# Patient Record
Sex: Male | Born: 1980
Health system: Southern US, Community
[De-identification: ages and names within clinical notes are randomized; demographics above are authoritative.]

## PROBLEM LIST (undated history)

## (undated) DIAGNOSIS — J40 Bronchitis, not specified as acute or chronic: Secondary | ICD-10-CM

## (undated) DIAGNOSIS — K219 Gastro-esophageal reflux disease without esophagitis: Secondary | ICD-10-CM

## (undated) DIAGNOSIS — F32A Depression, unspecified: Secondary | ICD-10-CM

## (undated) DIAGNOSIS — Z8679 Personal history of other diseases of the circulatory system: Secondary | ICD-10-CM

## (undated) DIAGNOSIS — M1990 Unspecified osteoarthritis, unspecified site: Secondary | ICD-10-CM

## (undated) DIAGNOSIS — N179 Acute kidney failure, unspecified: Secondary | ICD-10-CM

## (undated) DIAGNOSIS — J45909 Unspecified asthma, uncomplicated: Secondary | ICD-10-CM

## (undated) DIAGNOSIS — E785 Hyperlipidemia, unspecified: Secondary | ICD-10-CM

## (undated) DIAGNOSIS — I10 Essential (primary) hypertension: Secondary | ICD-10-CM

## (undated) DIAGNOSIS — I2510 Atherosclerotic heart disease of native coronary artery without angina pectoris: Secondary | ICD-10-CM

## (undated) DIAGNOSIS — Z720 Tobacco use: Secondary | ICD-10-CM

## (undated) DIAGNOSIS — F419 Anxiety disorder, unspecified: Secondary | ICD-10-CM

## (undated) DIAGNOSIS — F329 Major depressive disorder, single episode, unspecified: Secondary | ICD-10-CM

## (undated) HISTORY — DX: Acute kidney failure, unspecified: N17.9

## (undated) HISTORY — DX: Atherosclerotic heart disease of native coronary artery without angina pectoris: I25.10

## (undated) HISTORY — DX: Tobacco use: Z72.0

## (undated) HISTORY — DX: Hyperlipidemia, unspecified: E78.5

## (undated) HISTORY — PX: APPENDECTOMY: SHX54

---

## 2014-03-11 DIAGNOSIS — L400 Psoriasis vulgaris: Secondary | ICD-10-CM | POA: Insufficient documentation

## 2014-08-31 DIAGNOSIS — F411 Generalized anxiety disorder: Secondary | ICD-10-CM | POA: Insufficient documentation

## 2014-10-13 ENCOUNTER — Ambulatory Visit (HOSPITAL_COMMUNITY): Payer: Self-pay | Admitting: Physician Assistant

## 2015-07-13 DIAGNOSIS — L4050 Arthropathic psoriasis, unspecified: Secondary | ICD-10-CM | POA: Diagnosis present

## 2016-07-18 DIAGNOSIS — M5126 Other intervertebral disc displacement, lumbar region: Secondary | ICD-10-CM | POA: Insufficient documentation

## 2016-11-24 ENCOUNTER — Encounter (HOSPITAL_COMMUNITY): Payer: Self-pay | Admitting: Psychiatry

## 2016-11-24 ENCOUNTER — Ambulatory Visit (INDEPENDENT_AMBULATORY_CARE_PROVIDER_SITE_OTHER): Payer: BLUE CROSS/BLUE SHIELD | Admitting: Psychiatry

## 2016-11-24 VITALS — BP 130/80 | HR 86 | Resp 18 | Ht 74.0 in | Wt 368.0 lb

## 2016-11-24 DIAGNOSIS — F411 Generalized anxiety disorder: Secondary | ICD-10-CM | POA: Diagnosis not present

## 2016-11-24 DIAGNOSIS — F17210 Nicotine dependence, cigarettes, uncomplicated: Secondary | ICD-10-CM

## 2016-11-24 DIAGNOSIS — F332 Major depressive disorder, recurrent severe without psychotic features: Secondary | ICD-10-CM | POA: Diagnosis not present

## 2016-11-24 DIAGNOSIS — F0630 Mood disorder due to known physiological condition, unspecified: Secondary | ICD-10-CM | POA: Diagnosis not present

## 2016-11-24 DIAGNOSIS — M549 Dorsalgia, unspecified: Secondary | ICD-10-CM | POA: Insufficient documentation

## 2016-11-24 DIAGNOSIS — Z888 Allergy status to other drugs, medicaments and biological substances status: Secondary | ICD-10-CM

## 2016-11-24 DIAGNOSIS — Z79899 Other long term (current) drug therapy: Secondary | ICD-10-CM

## 2016-11-24 DIAGNOSIS — I10 Essential (primary) hypertension: Secondary | ICD-10-CM | POA: Insufficient documentation

## 2016-11-24 MED ORDER — LAMOTRIGINE 25 MG PO TABS: 25.0000 mg | ORAL_TABLET | Freq: Every day | ORAL | 0 refills | Status: DC

## 2016-11-24 NOTE — Progress Notes (Signed)
Psychiatric Initial Adult Assessment   Patient Identification: Scott Waller MRN:  [id number] Date of Evaluation:  11/24/2016 Referral Source: Marylene Landaniel Bryan, Primary care Chief Complaint:   Chief Complaint    Establish Care     Visit Diagnosis:    ICD-9-CM ICD-10-CM   1. Mood disorder in conditions classified elsewhere 293.83 F06.30   2. Severe episode of recurrent major depressive disorder, without psychotic features (HCC) 296.33 F33.2   3. GAD (generalized anxiety disorder) 300.02 F41.1     History of Present Illness:  36 years old currently married Caucasian male referred by primary care office for management for evaluation of depression and anxiety. Currently is on Xanax prescribed by primary care. States that he has mood swings and want to have possible evaluation if he has bipolar.  Patient doesn't drink he is drinking 6 packs on weekdays and 12 packs on the weekends that he has been a drinker he only drinks beer as a found the past he has been a cocaine user and marijuana user but does not use that anymore.  Endorses feeling down and depressed anxiety excessive worries he has had panic attacks in the past he has been on Xanax for the last 10 years. Says that once he has a panic attack nothing else helps but the Xanax. He has difficult relationship with her wife who keeps up with the finances he does not have any clue where the money goes he brings in the money and it is transferred to his wife's account. He does have a son 38 years old. Says my in-laws does not like me. 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 He still does not want to talk too much about alcohol says that he has gone through so much and  alcohol is only thing that keeps him going he does not want to cut down further. He got confrontative that if doc you want to talk about alcohol I am going to step out.   He fixes car and sells them he does have episodes of increased energy but not psychotic symptoms. He likes fixing car or keeping himself busy but as of now he has limitations and neuropathy keeps him more withdrawn and depressed.  Modifying factors; his son Associated Signs/Symptoms: Depression Symptoms:  depressed mood, difficulty concentrating, anxiety, loss of energy/fatigue, (Hypo) Manic Symptoms:  Distractibility, lability of mood Anxiety Symptoms:  Excessive Worry, Psychotic Symptoms:  denies PTSD Symptoms: Had a traumatic exposure:  difficult childhood Hyperarousal:  Irritability/Anger  Past Psychiatric History: depression. Anxiety. Panic attacks  Previous Psychotropic Medications: Yes  Says have tried all SSRI . pristiq . They dont work. Only xanax helps anxiety  Substance Abuse History in the last 12 months:  Yes.    alcohol  Consequences of Substance Abuse: Medical Consequences:  depression, mood swing.   Past Medical History: No past medical history on file. No past surgical history on file.  Family Psychiatric History: they had issues with anxiety..mom had extreme anxiety  Family History: No family history on file.  Social History:   Social History   Social History  . Marital status: Married    Spouse name: N/A  . Number of children: N/A  .  Years of education: N/A   Social History Main Topics  . Smoking status: Current Every Day Smoker    Packs/day: 1.00    Years: 25.00    Types: Cigarettes  . Smokeless tobacco: Never Used  . Alcohol use 12.0 - 14.4 oz/week    20 - 24 Cans of beer per week  . Drug use: No  . Sexual activity: Yes    Partners: Female   Other Topics Concern  . None   Social History Narrative  . None    Additional Social History: His parents and sister says it is  tough and rough growing up. He left high school in 15th year. 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 Currently married legal issue.  Allergies:   Allergies  Allergen Reactions  . Desvenlafaxine Anaphylaxis    High BP    Metabolic Disorder Labs: No results found for: HGBA1C, MPG No results found for: PROLACTIN No results found for: CHOL, TRIG, HDL, CHOLHDL, VLDL, LDLCALC   Current Medications: Current Outpatient Prescriptions  Medication Sig Dispense Refill  . ALPRAZolam (XANAX) 0.5 MG tablet Take 0.5 mg by mouth 3 (three) times daily as needed.    . diltiazem (CARDIZEM CD) 240 MG 24 hr capsule Take 240 mg by mouth daily.    Marland Kitchen HYDROcodone-acetaminophen (NORCO) 10-325 MG tablet Take 1/2-1 tablet by mouth up to 3 times daily PRN pain    . lisinopril-hydrochlorothiazide (PRINZIDE,ZESTORETIC) 20-25 MG tablet Take 20-25 tablets by mouth daily.    . traZODone (DESYREL) 100 MG tablet Take 1/2-1 tablet by mouth nighty for insomnia    . lamoTRIgine (LAMICTAL) 25 MG tablet Take 1 tablet (25 mg total) by mouth daily. Take one tablet daily for a week and then start taking 2 tablets. 60 tablet 0   No current facility-administered medications for this visit.     Neurologic: Headache: No Seizure: No Paresthesias:Yes  Musculoskeletal: Strength & Muscle Tone: within normal limits Gait & Station: normal Patient leans: no lean  Psychiatric Specialty Exam: Review of Systems  Cardiovascular: Negative for chest pain.  Musculoskeletal: Positive for back pain.  Skin: Negative for rash.  Psychiatric/Behavioral: Positive for depression. Negative for suicidal ideas. The patient is nervous/anxious.     Blood pressure 130/80, pulse 86, resp. rate 18, height 6\' 2"  (1.88 m), weight (!) 368 lb (166.9 kg), SpO2 95 %.Body mass index is 47.25 kg/m.  General Appearance: Casual  Eye Contact:  Fair  Speech:  Normal Rate   Volume:  Normal  Mood:  Anxious and Dysphoric  Affect:  Congruent  Thought Process:  Goal Directed  Orientation:  Full (Time, Place, and Person)  Thought Content:  Rumination  Suicidal Thoughts:  No  Homicidal Thoughts:  No  Memory:  Immediate;   Fair Recent;   Fair  Judgement:  Poor  Insight:  Shallow  Psychomotor Activity:  Decreased  Concentration:  Concentration: Fair and Attention Span: Fair  Recall:  [employer] of Knowledge:Fair  Language: Fair  Akathisia:  Negative  Handed:  Right  AIMS (if indicated):    Assets:  Desire for Improvement Housing  ADL's:  Intact  Cognition: WNL  Sleep:  fair    Treatment Plan Summary: Medication management and Plan as follows  Mood disorder/ Major depression, recurrent moderate: has been on SSRI . Doesn't help.  Alcohol and medical conditions may be exacbating his symptoms. Will start mood stabilizer never tried.  lamictal 25mg  increase to 50 in one week  GAD/ Panic: on xanax by primary care. Says he will continue with them. I cautioned we will not prescribe it and he should cut down slowly. Says he will get it from primary care and has meds. Does not want to be on ssri or buspar.  He understands he is on pain meds as well and combination with benzo can make him feel down or impulsive.  Alcohol use : 6 packs a day. More on weekends. Remains resistent to cut down says he has been drinking most of his life and this is only thing that gives him some thing to look forward Explained meds may not work as alcohol is a downer and would keep him dysphoric or emotional.   Will refer to therapy to work on coping skills and understand dynamics of his anxiety and depression  Not suicidal . Not psychotic More than 50% spent in counseling and coordination of care including patient education and review of side effects and working on coping skills and abstinence as a goal from alcohol FU 3-4 weeks or early if needed.   [name],  MD 2/23/201811:45 AM

## 2016-12-07 ENCOUNTER — Other Ambulatory Visit (HOSPITAL_COMMUNITY): Payer: Self-pay | Admitting: Psychiatry

## 2016-12-14 NOTE — Telephone Encounter (Signed)
Received fax from [facility] requesting a refill for Lamictal. Per Dr. Gilmore LarocheAkhtar, refill request is denied. Pt will need to schedule an apt. Lvm for pt to contact office.

## 2016-12-15 ENCOUNTER — Ambulatory Visit (HOSPITAL_COMMUNITY): Payer: Self-pay | Admitting: Psychiatry

## 2016-12-22 ENCOUNTER — Ambulatory Visit (HOSPITAL_COMMUNITY): Payer: Self-pay | Admitting: Licensed Clinical Social Worker

## 2017-09-28 ENCOUNTER — Encounter (HOSPITAL_COMMUNITY): Payer: Self-pay | Admitting: Emergency Medicine

## 2017-09-28 ENCOUNTER — Emergency Department (HOSPITAL_COMMUNITY): Payer: BLUE CROSS/BLUE SHIELD

## 2017-09-28 ENCOUNTER — Emergency Department (HOSPITAL_COMMUNITY)
Admission: EM | Admit: 2017-09-28 | Discharge: 2017-09-28 | Disposition: A | Discharge status: Home / Self Care | Payer: BLUE CROSS/BLUE SHIELD | Attending: Emergency Medicine | Admitting: Emergency Medicine

## 2017-09-28 ENCOUNTER — Other Ambulatory Visit: Payer: Self-pay

## 2017-09-28 DIAGNOSIS — I10 Essential (primary) hypertension: Secondary | ICD-10-CM | POA: Diagnosis not present

## 2017-09-28 DIAGNOSIS — J4541 Moderate persistent asthma with (acute) exacerbation: Secondary | ICD-10-CM | POA: Diagnosis not present

## 2017-09-28 DIAGNOSIS — R0602 Shortness of breath: Secondary | ICD-10-CM | POA: Diagnosis present

## 2017-09-28 DIAGNOSIS — R05 Cough: Secondary | ICD-10-CM | POA: Insufficient documentation

## 2017-09-28 DIAGNOSIS — Z79899 Other long term (current) drug therapy: Secondary | ICD-10-CM | POA: Insufficient documentation

## 2017-09-28 DIAGNOSIS — R509 Fever, unspecified: Secondary | ICD-10-CM | POA: Diagnosis not present

## 2017-09-28 DIAGNOSIS — F17210 Nicotine dependence, cigarettes, uncomplicated: Secondary | ICD-10-CM | POA: Diagnosis not present

## 2017-09-28 DIAGNOSIS — J45909 Unspecified asthma, uncomplicated: Secondary | ICD-10-CM | POA: Diagnosis not present

## 2017-09-28 HISTORY — DX: Bronchitis, not specified as acute or chronic: J40

## 2017-09-28 HISTORY — DX: Unspecified osteoarthritis, unspecified site: M19.90

## 2017-09-28 HISTORY — DX: Unspecified asthma, uncomplicated: J45.909

## 2017-09-28 HISTORY — DX: Essential (primary) hypertension: I10

## 2017-09-28 LAB — CBC WITH DIFFERENTIAL/PLATELET
BASOS ABS: 0 10*3/uL (ref 0.0–0.1)
Basophils Relative: 0 %
Eosinophils Absolute: 0.1 10*3/uL (ref 0.0–0.7)
Eosinophils Relative: 2 %
HEMATOCRIT: 47.2 % (ref 39.0–52.0)
HEMOGLOBIN: 15 g/dL (ref 13.0–17.0)
LYMPHS PCT: 14 %
Lymphs Abs: 0.7 10*3/uL (ref 0.7–4.0)
MCH: 30.9 pg (ref 26.0–34.0)
MCHC: 31.8 g/dL (ref 30.0–36.0)
MCV: 97.3 fL (ref 78.0–100.0)
MONO ABS: 0.4 10*3/uL (ref 0.1–1.0)
MONOS PCT: 7 %
NEUTROS ABS: 3.9 10*3/uL (ref 1.7–7.7)
NEUTROS PCT: 77 %
Platelets: 159 10*3/uL (ref 150–400)
RBC: 4.85 MIL/uL (ref 4.22–5.81)
RDW: 12.8 % (ref 11.5–15.5)
WBC: 5.1 10*3/uL (ref 4.0–10.5)

## 2017-09-28 LAB — BRAIN NATRIURETIC PEPTIDE: B Natriuretic Peptide: 14 pg/mL (ref 0.0–100.0)

## 2017-09-28 LAB — BASIC METABOLIC PANEL
ANION GAP: 11 (ref 5–15)
BUN: 11 mg/dL (ref 6–20)
CO2: 27 mmol/L (ref 22–32)
Calcium: 8.7 mg/dL — ABNORMAL LOW (ref 8.9–10.3)
Chloride: 99 mmol/L — ABNORMAL LOW (ref 101–111)
Creatinine, Ser: 0.93 mg/dL (ref 0.61–1.24)
GFR calc non Af Amer: >60 mL/min (ref >60)
GLUCOSE: 115 mg/dL — AB (ref 65–99)
POTASSIUM: 4.1 mmol/L (ref 3.5–5.1)
Sodium: 137 mmol/L (ref 135–145)

## 2017-09-28 LAB — TROPONIN I

## 2017-09-28 MED ORDER — ALBUTEROL SULFATE (2.5 MG/3ML) 0.083% IN NEBU
2.5000 mg | INHALATION_SOLUTION | Freq: Once | RESPIRATORY_TRACT | Status: AC
  Administered 2017-09-28: 2.5 mg via RESPIRATORY_TRACT
  Filled 2017-09-28: qty 3

## 2017-09-28 MED ORDER — IPRATROPIUM-ALBUTEROL 0.5-2.5 (3) MG/3ML IN SOLN
3.0000 mL | Freq: Once | RESPIRATORY_TRACT | Status: AC
  Administered 2017-09-28: 3 mL via RESPIRATORY_TRACT
  Filled 2017-09-28: qty 3

## 2017-09-28 MED ORDER — PREDNISONE 50 MG PO TABS: ORAL_TABLET | ORAL | 0 refills | Status: DC

## 2017-09-28 MED ORDER — ALBUTEROL SULFATE (2.5 MG/3ML) 0.083% IN NEBU: 2.5000 mg | INHALATION_SOLUTION | RESPIRATORY_TRACT | 12 refills | Status: AC | PRN

## 2017-09-28 MED ORDER — PREDNISONE 50 MG PO TABS
60.0000 mg | ORAL_TABLET | Freq: Once | ORAL | Status: AC
  Administered 2017-09-28: 60 mg via ORAL
  Filled 2017-09-28: qty 1

## 2017-09-28 NOTE — ED Provider Notes (Signed)
[facility] EMERGENCY DEPARTMENT Provider Note   CSN: [id number] Arrival date & time: 09/28/17  0259     History   Chief Complaint Chief Complaint  Patient presents with  . Shortness of Breath    HPI Scott Waller is a 36 y.o. male.  The history is provided by the patient.  Shortness of Breath  This is a new problem. The average episode lasts 2 days. The problem occurs frequently.The current episode started 2 days ago. The problem has been gradually worsening. Associated symptoms include a fever, cough and wheezing. Pertinent negatives include no sore throat, no hemoptysis, no chest pain and no leg swelling. Treatments tried: Albuterol. The treatment provided mild relief. Associated medical issues include asthma.  Patient with history of asthma, morbid obesity presents with shortness of breath, cough for the past 2 days He reports he has felt febrile and having chills at home No significant chest pain reported He has had some shortness of breath though this is due to his asthma Patient is a smoker Past Medical History:  Diagnosis Date  . A-fib (HCC)   . Arthritis   . Asthma   . Bronchitis   . Depression   . Hypertension     Patient Active Problem List   Diagnosis Date Noted  . Back pain 11/24/2016  . Essential hypertension 11/24/2016  . Herniated lumbar intervertebral disc 07/18/2016  . Generalized anxiety disorder 08/31/2014    Past Surgical History:  Procedure Laterality Date  . APPENDECTOMY         Home Medications    Prior to Admission medications   Medication Sig Start Date End Date Taking? Authorizing Provider  ALPRAZolam Prudy Feeler(XANAX) 0.5 MG tablet Take 0.5 mg by mouth 3 (three) times daily as needed. 11/07/16  Yes [provider]  diltiazem (CARDIZEM CD) 240 MG 24 hr capsule Take 240 mg by mouth daily. 05/12/16  Yes [provider]  HYDROcodone-acetaminophen (NORCO) 10-325 MG tablet Take 1/2-1 tablet by mouth up to 3 times daily PRN pain  11/07/16  Yes [provider]  lamoTRIgine (LAMICTAL) 25 MG tablet Take 1 tablet (25 mg total) by mouth daily. Take one tablet daily for a week and then start taking 2 tablets. 11/24/16  Yes Thresa RossAkhtar, Nadeem, MD  lisinopril-hydrochlorothiazide (PRINZIDE,ZESTORETIC) 20-25 MG tablet Take 20-25 tablets by mouth daily. 12/08/15 09/28/17 Yes [provider]  traZODone (DESYREL) 100 MG tablet Take 1/2-1 tablet by mouth nighty for insomnia 06/20/16  Yes [provider]    Family History No family history on file.  Social History Social History   Tobacco Use  . Smoking status: Current Every Day Smoker    Packs/day: 1.00    Years: 25.00    Pack years: 25.00    Types: Cigarettes  . Smokeless tobacco: Never Used  Substance Use Topics  . Alcohol use: Yes    Alcohol/week: 12.0 - 14.4 oz    Types: 20 - 24 Cans of beer per week  . Drug use: No     Allergies   Desvenlafaxine   Review of Systems Review of Systems  Constitutional: Positive for fever.  HENT: Negative for sore throat.   Respiratory: Positive for cough, shortness of breath and wheezing. Negative for hemoptysis.   Cardiovascular: Negative for chest pain and leg swelling.  All other systems reviewed and are negative.    Physical Exam Updated Vital Signs BP 130/77   Pulse 95   Temp 99.3 F (37.4 C) (Oral)   Resp 16  Ht 1.905 m (6\' 3" )   Wt (!) 167.8 kg (370 lb)   SpO2 94%   BMI 46.25 kg/m   Physical Exam CONSTITUTIONAL: Well developed/well nourished, resting comfortably HEAD: Normocephalic/atraumatic EYES: EOMI/PERRL ENMT: Mucous membranes moist NECK: supple no meningeal signs SPINE/BACK:entire spine nontender CV: S1/S2 noted, no murmurs/rubs/gallops noted LUNGS: No distress noted, wheezing noted in the bases ABDOMEN: soft, nontender, obese GU:no cva tenderness NEURO: Pt is awake/alert/appropriate, moves all extremitiesx4.  No facial droop.   EXTREMITIES: pulses normal/equal, full  ROM SKIN: warm, color normal PSYCH: no abnormalities of mood noted, alert and oriented to situation   ED Treatments / Results  Labs (all labs ordered are listed, but only abnormal results are displayed) Labs Reviewed  BASIC METABOLIC PANEL - Abnormal; Notable for the following components:      Result Value   Chloride 99 (*)    Glucose, Bld 115 (*)    Calcium 8.7 (*)    All other components within normal limits  CBC WITH DIFFERENTIAL/PLATELET  BRAIN NATRIURETIC PEPTIDE  TROPONIN I    EKG  EKG Interpretation  Date/Time:  Friday September 28 2017 03:08:06 EST Ventricular Rate:  103 PR Interval:    QRS Duration: 92 QT Interval:  318 QTC Calculation: 417 R Axis:   -37 Text Interpretation:  Sinus tachycardia Left axis deviation Low voltage, precordial leads Probable anteroseptal infarct, old No previous ECGs available Confirmed by Zadie RhineWickline, Renne Cornick ([id number]) on 09/28/2017 3:13:09 AM       Radiology Dg Chest 2 View  Result Date: 09/28/2017 CLINICAL DATA:  36 y/o  M; shortness of breath. EXAM: CHEST  2 VIEW COMPARISON:  None. FINDINGS: Normal cardiac silhouette. Pulmonary vascular congestion. No consolidation. No pleural effusion or pneumothorax. Bones are unremarkable. IMPRESSION: Pulmonary vascular congestion.  No consolidation. Electronically Signed   By: Mitzi HansenLance  Furusawa-Stratton M.D.   On: 09/28/2017 03:34    Procedures Procedures (including critical care time)  Medications Ordered in ED Medications  ipratropium-albuterol (DUONEB) 0.5-2.5 (3) MG/3ML nebulizer solution 3 mL (3 mLs Nebulization Given 09/28/17 0512)  predniSONE (DELTASONE) tablet 60 mg (60 mg Oral Given 09/28/17 0508)  albuterol (PROVENTIL) (2.5 MG/3ML) 0.083% nebulizer solution 2.5 mg (2.5 mg Nebulization Given 09/28/17 0512)     Initial Impression / Assessment and Plan / ED Course  I have reviewed the triage vital signs and the nursing notes.  Pertinent labs & imaging results that were available during  my care of the patient were reviewed by me and considered in my medical decision making (see chart for details).     5:21 AM Patient presents with cough, feeling chills, feeling febrile, shortness of breath for 2 days He reports he does have a history of asthma, and has nebulizer at home that he has been using Suspect this may be a viral illness that has contributed to his wheezing However we do not have much testing in our system, will check labs and reassess He reports he does have low doses of prednisone at home for his arthritis, and has been taking 15 mg for 2 days We will need to increase this dosing 6:36 AM Patient improved, resting comfortably There is no respiratory distress On room air his pulse ox is around 95% Suspect viral illness that has triggered his wheezing I have low suspicion for CHF, PE at this time  Told to quit smoking We discussed strict return precautions, and appropriate use of albuterol with his nebulizer at home Final Clinical Impressions(s) / ED Diagnoses   Final  diagnoses:  Moderate persistent asthma with exacerbation    ED Discharge Orders        Ordered    predniSONE (DELTASONE) 50 MG tablet     09/28/17 0620    albuterol (PROVENTIL) (2.5 MG/3ML) 0.083% nebulizer solution  Every 4 hours PRN     09/28/17 1610       [name], MD 09/28/17 [phone]

## 2017-09-28 NOTE — ED Triage Notes (Signed)
Pt c/o sob since 12/25. Pt states he has been coughing up mucus.

## 2018-08-14 ENCOUNTER — Emergency Department (HOSPITAL_COMMUNITY)
Admission: EM | Admit: 2018-08-14 | Discharge: 2018-08-14 | Disposition: A | Discharge status: Home / Self Care | Payer: BLUE CROSS/BLUE SHIELD | Attending: Emergency Medicine | Admitting: Emergency Medicine

## 2018-08-14 ENCOUNTER — Emergency Department (HOSPITAL_COMMUNITY): Payer: BLUE CROSS/BLUE SHIELD

## 2018-08-14 ENCOUNTER — Encounter (HOSPITAL_COMMUNITY): Payer: Self-pay | Admitting: Radiology

## 2018-08-14 DIAGNOSIS — Z79899 Other long term (current) drug therapy: Secondary | ICD-10-CM | POA: Insufficient documentation

## 2018-08-14 DIAGNOSIS — R0602 Shortness of breath: Secondary | ICD-10-CM | POA: Diagnosis present

## 2018-08-14 DIAGNOSIS — I10 Essential (primary) hypertension: Secondary | ICD-10-CM | POA: Insufficient documentation

## 2018-08-14 DIAGNOSIS — J45909 Unspecified asthma, uncomplicated: Secondary | ICD-10-CM | POA: Insufficient documentation

## 2018-08-14 DIAGNOSIS — R109 Unspecified abdominal pain: Secondary | ICD-10-CM | POA: Diagnosis not present

## 2018-08-14 DIAGNOSIS — R1011 Right upper quadrant pain: Secondary | ICD-10-CM | POA: Diagnosis not present

## 2018-08-14 DIAGNOSIS — R10811 Right upper quadrant abdominal tenderness: Secondary | ICD-10-CM

## 2018-08-14 DIAGNOSIS — F17210 Nicotine dependence, cigarettes, uncomplicated: Secondary | ICD-10-CM | POA: Insufficient documentation

## 2018-08-14 LAB — HEPATIC FUNCTION PANEL
ALK PHOS: 38 U/L (ref 38–126)
ALT: 51 U/L — ABNORMAL HIGH (ref 0–44)
AST: 35 U/L (ref 15–41)
Albumin: 4.1 g/dL (ref 3.5–5.0)
BILIRUBIN DIRECT: 0.1 mg/dL (ref 0.0–0.2)
BILIRUBIN INDIRECT: 0.5 mg/dL (ref 0.3–0.9)
BILIRUBIN TOTAL: 0.6 mg/dL (ref 0.3–1.2)
Total Protein: 7.5 g/dL (ref 6.5–8.1)

## 2018-08-14 LAB — CBC
HCT: 49.4 % (ref 39.0–52.0)
HEMOGLOBIN: 16.2 g/dL (ref 13.0–17.0)
MCH: 31.5 pg (ref 26.0–34.0)
MCHC: 32.8 g/dL (ref 30.0–36.0)
MCV: 95.9 fL (ref 80.0–100.0)
NRBC: 0 % (ref 0.0–0.2)
Platelets: 180 10*3/uL (ref 150–400)
RBC: 5.15 MIL/uL (ref 4.22–5.81)
RDW: 12.6 % (ref 11.5–15.5)
WBC: 7.5 10*3/uL (ref 4.0–10.5)

## 2018-08-14 LAB — URINALYSIS, ROUTINE W REFLEX MICROSCOPIC
BILIRUBIN URINE: NEGATIVE
Glucose, UA: NEGATIVE mg/dL
Hgb urine dipstick: NEGATIVE
KETONES UR: NEGATIVE mg/dL
Leukocytes, UA: NEGATIVE
NITRITE: NEGATIVE
PROTEIN: NEGATIVE mg/dL
Specific Gravity, Urine: 1.004 — ABNORMAL LOW (ref 1.005–1.030)
pH: 7 (ref 5.0–8.0)

## 2018-08-14 LAB — LIPASE, BLOOD: Lipase: 27 U/L (ref 11–51)

## 2018-08-14 LAB — BASIC METABOLIC PANEL
ANION GAP: 11 (ref 5–15)
BUN: 14 mg/dL (ref 6–20)
CO2: 26 mmol/L (ref 22–32)
Calcium: 9.1 mg/dL (ref 8.9–10.3)
Chloride: 97 mmol/L — ABNORMAL LOW (ref 98–111)
Creatinine, Ser: 1 mg/dL (ref 0.61–1.24)
GFR calc non Af Amer: >60 mL/min (ref >60)
Glucose, Bld: 115 mg/dL — ABNORMAL HIGH (ref 70–99)
POTASSIUM: 4.3 mmol/L (ref 3.5–5.1)
SODIUM: 134 mmol/L — AB (ref 135–145)

## 2018-08-14 LAB — POCT I-STAT TROPONIN I
TROPONIN I, POC: 0.01 ng/mL (ref 0.00–0.08)
TROPONIN I, POC: 0.03 ng/mL (ref 0.00–0.08)

## 2018-08-14 MED ORDER — SODIUM CHLORIDE (PF) 0.9 % IJ SOLN
INTRAMUSCULAR | Status: AC
  Administered 2018-08-14: 20:00:00
  Filled 2018-08-14: qty 50

## 2018-08-14 MED ORDER — NICOTINE 21 MG/24HR TD PT24
21.0000 mg | MEDICATED_PATCH | Freq: Once | TRANSDERMAL | Status: DC
  Administered 2018-08-14: 21 mg via TRANSDERMAL
  Filled 2018-08-14: qty 1

## 2018-08-14 MED ORDER — LORAZEPAM 2 MG/ML IJ SOLN: 0.0000 mg | Freq: Two times a day (BID) | INTRAMUSCULAR | Status: DC

## 2018-08-14 MED ORDER — LORAZEPAM 1 MG PO TABS
0.0000 mg | ORAL_TABLET | Freq: Four times a day (QID) | ORAL | Status: DC
  Filled 2018-08-14: qty 1

## 2018-08-14 MED ORDER — LORAZEPAM 2 MG/ML IJ SOLN
0.0000 mg | Freq: Four times a day (QID) | INTRAMUSCULAR | Status: DC
  Administered 2018-08-14: 2 mg via INTRAVENOUS
  Filled 2018-08-14: qty 1

## 2018-08-14 MED ORDER — IOPAMIDOL (ISOVUE-300) INJECTION 61%
100.0000 mL | Freq: Once | INTRAVENOUS | Status: AC | PRN
  Administered 2018-08-14: 100 mL via INTRAVENOUS

## 2018-08-14 MED ORDER — METHOCARBAMOL 500 MG PO TABS: 500.0000 mg | ORAL_TABLET | Freq: Two times a day (BID) | ORAL | 0 refills | Status: DC

## 2018-08-14 MED ORDER — THIAMINE HCL 100 MG/ML IJ SOLN
100.0000 mg | Freq: Every day | INTRAMUSCULAR | Status: DC
  Administered 2018-08-14: 100 mg via INTRAVENOUS
  Filled 2018-08-14: qty 2

## 2018-08-14 MED ORDER — DICYCLOMINE HCL 20 MG PO TABS: 20.0000 mg | ORAL_TABLET | Freq: Two times a day (BID) | ORAL | 0 refills | Status: DC

## 2018-08-14 MED ORDER — SODIUM CHLORIDE 0.9 % IV BOLUS
1000.0000 mL | Freq: Once | INTRAVENOUS | Status: AC
  Administered 2018-08-14: 1000 mL via INTRAVENOUS

## 2018-08-14 MED ORDER — VITAMIN B-1 100 MG PO TABS
100.0000 mg | ORAL_TABLET | Freq: Every day | ORAL | Status: DC
  Filled 2018-08-14: qty 1

## 2018-08-14 MED ORDER — ONDANSETRON HCL 4 MG/2ML IJ SOLN
4.0000 mg | Freq: Once | INTRAMUSCULAR | Status: AC
  Administered 2018-08-14: 4 mg via INTRAVENOUS
  Filled 2018-08-14: qty 2

## 2018-08-14 MED ORDER — LORAZEPAM 1 MG PO TABS: 0.0000 mg | ORAL_TABLET | Freq: Two times a day (BID) | ORAL | Status: DC

## 2018-08-14 MED ORDER — IOPAMIDOL (ISOVUE-300) INJECTION 61%
INTRAVENOUS | Status: AC
  Filled 2018-08-14: qty 100

## 2018-08-14 MED ORDER — MORPHINE SULFATE (PF) 4 MG/ML IV SOLN
4.0000 mg | Freq: Once | INTRAVENOUS | Status: AC
  Administered 2018-08-14: 4 mg via INTRAVENOUS
  Filled 2018-08-14: qty 1

## 2018-08-14 MED ORDER — ONDANSETRON 8 MG PO TBDP: 8.0000 mg | ORAL_TABLET | Freq: Three times a day (TID) | ORAL | 0 refills | Status: AC | PRN

## 2018-08-14 NOTE — ED Provider Notes (Signed)
[facility]-EMERGENCY DEPT Provider Note   CSN: [id number] Arrival date & time: 08/14/18  1309     History   Chief Complaint Chief Complaint  Patient presents with  . Shortness of Breath  . Abdominal Pain    HPI Scott Waller is a 37 y.o. male.  HPI   Scott Waller is a 37 y.o. male, with a history of A. fib, asthma, and HTN, presenting to the ED with right shoulder pain for the last few days. This pain would occur at night or with eating certain foods. Also been having pain in the right flank. Accompanied by nausea and vomiting.  Last night began to have pain in the RUQ along with the shoulder pain. Pain is now waxing and waning, currently 3-4/10, sharp, nonradiating from RUQ and right shoulder. Now accompanied by chills. At times he has tried taking NTG, has improved the pain in the past, but not last night. His last food was this morning at 7:30AM.  Denies known fever, chest pain, shortness of breath, vomiting, diarrhea, hematochezia/melena, urinary symptoms, acute cough, or any other complaints.     Past Medical History:  Diagnosis Date  . A-fib (HCC)   . Arthritis   . Asthma   . Bronchitis   . Depression   . Hypertension     Patient Active Problem List   Diagnosis Date Noted  . Back pain 11/24/2016  . Essential hypertension 11/24/2016  . Herniated lumbar intervertebral disc 07/18/2016  . Generalized anxiety disorder 08/31/2014    Past Surgical History:  Procedure Laterality Date  . APPENDECTOMY          Home Medications    Prior to Admission medications   Medication Sig Start Date End Date Taking? Authorizing Provider  albuterol (PROVENTIL) (2.5 MG/3ML) 0.083% nebulizer solution Take 3 mLs (2.5 mg total) by nebulization every 4 (four) hours as needed for wheezing or shortness of breath. 09/28/17  Yes Zadie RhineWickline, Donald, MD  ALPRAZolam Prudy Feeler(XANAX) 0.5 MG tablet Take 0.5 mg by mouth 3 (three) times daily as needed for anxiety.  11/07/16  Yes  [provider]  BREO ELLIPTA 100-25 MCG/INH AEPB Take 1 puff by mouth daily. 07/26/18  Yes [provider]  CARAFATE 1 GM/10ML suspension Take 10 mLs by mouth 4 (four) times daily -  with meals and at bedtime. 08/05/18  Yes [provider]  Clobetasol Propionate (TEMOVATE) 0.05 % external spray Apply 1 application topically 2 (two) times daily as needed. 08/12/18  Yes [provider]  COSENTYX SENSOREADY, 300 MG, 150 MG/ML SOAJ Inject 150 mg into the skin every 30 (thirty) days. 07/29/18  Yes [provider]  diltiazem (CARDIZEM CD) 240 MG 24 hr capsule Take 240 mg by mouth every evening.  05/12/16  Yes [provider]  fluticasone (FLONASE) 50 MCG/ACT nasal spray Place 2 sprays into both nostrils daily. 08/10/18  Yes [provider]  HYDROcodone-acetaminophen (NORCO) 10-325 MG tablet Take 1 tablet by mouth 3 (three) times daily.  11/07/16  Yes [provider]  lamoTRIgine (LAMICTAL) 25 MG tablet Take 1 tablet (25 mg total) by mouth daily. Take one tablet daily for a week and then start taking 2 tablets. Patient taking differently: Take 50 mg by mouth daily.  11/24/16  Yes Thresa RossAkhtar, Nadeem, MD  Multiple Vitamins-Minerals (MULTIVITAMIN MEN PO) Take 1 tablet by mouth daily.   Yes [provider]  nitroGLYCERIN (NITROSTAT) 0.4 MG SL tablet Place 0.4 mg under the tongue every 5 (five) minutes  x 3 doses as needed. If no improvement, call 911 08/08/18  Yes [provider]  ondansetron (ZOFRAN-ODT) 8 MG disintegrating tablet Take 8 mg by mouth 3 (three) times daily as needed for nausea or vomiting.  08/05/18  Yes [provider]  pantoprazole (PROTONIX) 40 MG tablet Take 40 mg by mouth 2 (two) times daily. 08/08/18  Yes [provider]  traZODone (DESYREL) 150 MG tablet Take 150 mg by mouth at bedtime.   Yes [provider]  valsartan-hydrochlorothiazide (DIOVAN-HCT) 320-25 MG tablet Take 1 tablet by  mouth daily. 06/10/18  Yes [provider]  dicyclomine (BENTYL) 20 MG tablet Take 1 tablet (20 mg total) by mouth 2 (two) times daily. 08/14/18   [name], [name], PA-C  methocarbamol (ROBAXIN) 500 MG tablet Take 1 tablet (500 mg total) by mouth 2 (two) times daily. 08/14/18   [name], [name], PA-C  ondansetron (ZOFRAN-ODT) 8 MG disintegrating tablet Take 1 tablet (8 mg total) by mouth every 8 (eight) hours as needed for nausea or vomiting. 08/14/18   [name], Hillard Danker, PA-C  predniSONE (DELTASONE) 50 MG tablet One tablet PO daily for 4 days Patient not taking: Reported on 08/14/2018 09/28/17   [name], MD    Family History No family history on file.  Social History Social History   Tobacco Use  . Smoking status: Current Every Day Smoker    Packs/day: 1.00    Years: 25.00    Pack years: 25.00    Types: Cigarettes  . Smokeless tobacco: Never Used  Substance Use Topics  . Alcohol use: Yes    Alcohol/week: 20.0 - 24.0 standard drinks    Types: 20 - 24 Cans of beer per week  . Drug use: No     Allergies   Desvenlafaxine   Review of Systems Review of Systems  Constitutional: Positive for chills. Negative for fever.  Respiratory: Negative for cough and shortness of breath.   Cardiovascular: Negative for chest pain and leg swelling.  Gastrointestinal: Positive for abdominal pain and nausea. Negative for blood in stool, constipation, diarrhea and vomiting.  Musculoskeletal: Positive for arthralgias.  Neurological: Negative for weakness.  All other systems reviewed and are negative.    Physical Exam Updated Vital Signs BP (!) 185/110 (BP Location: Left Arm)   Pulse 83   Temp 98 F (36.7 C) (Oral)   Resp 18   SpO2 96%   Physical Exam  Constitutional: He appears well-developed and well-nourished. No distress.  HENT:  Head: Normocephalic and atraumatic.  Eyes: Conjunctivae are normal.  Neck: Neck supple.  Cardiovascular: Normal rate, regular rhythm, normal heart  sounds and intact distal pulses.  Pulmonary/Chest: Effort normal and breath sounds normal. No respiratory distress.  Abdominal: Soft. There is tenderness in the right upper quadrant. There is no guarding and no CVA tenderness.  Musculoskeletal: He exhibits no edema.  No tenderness in the right chest or shoulder.  Full range of motion in the right shoulder without pain or abnormalities noted.  Lymphadenopathy:    He has no cervical adenopathy.  Neurological: He is alert.  Skin: Skin is warm and dry. He is not diaphoretic.  Psychiatric: He has a normal mood and affect. His behavior is normal.  Nursing note and vitals reviewed.    ED Treatments / Results  Labs (all labs ordered are listed, but only abnormal results are displayed) Labs Reviewed  BASIC METABOLIC PANEL - Abnormal; Notable for the following components:      Result Value  Sodium 134 (*)    Chloride 97 (*)    Glucose, Bld 115 (*)    All other components within normal limits  URINALYSIS, ROUTINE W REFLEX MICROSCOPIC - Abnormal; Notable for the following components:   Color, Urine STRAW (*)    Specific Gravity, Urine 1.004 (*)    All other components within normal limits  HEPATIC FUNCTION PANEL - Abnormal; Notable for the following components:   ALT 51 (*)    All other components within normal limits  CBC  LIPASE, BLOOD  I-STAT TROPONIN, ED  POCT I-STAT TROPONIN I  I-STAT TROPONIN, ED  POCT I-STAT TROPONIN I    EKG EKG Interpretation  Date/Time:  Wednesday August 14 2018 13:17:16 EST Ventricular Rate:  84 PR Interval:    QRS Duration: 90 QT Interval:  361 QTC Calculation: 427 R Axis:   -66 Text Interpretation:  Sinus rhythm Left anterior fascicular block RSR' in V1 or V2, right VCD or RVH Baseline wander in lead(s) V6 No significant change since last tracing Confirmed by Jacalyn Lefevre [phone]) on 08/14/2018 2:59:15 PM   Radiology Dg Chest 2 View  Result Date: 08/14/2018 CLINICAL DATA:  Chest pain and  shortness of breath EXAM: CHEST - 2 VIEW COMPARISON:  September 28, 2017 FINDINGS: Lungs are clear. Heart is upper normal in size with pulmonary vascularity normal. No adenopathy. No pneumothorax. No bone lesions. IMPRESSION: No edema or consolidation. Electronically Signed   By: Bretta Bang III M.D.   On: 08/14/2018 13:56   Ct Abdomen Pelvis W Contrast  Result Date: 08/14/2018 CLINICAL DATA:  Initial evaluation for acute abdominal pain, nausea. EXAM: CT ABDOMEN AND PELVIS WITH CONTRAST TECHNIQUE: Multidetector CT imaging of the abdomen and pelvis was performed using the standard protocol following bolus administration of intravenous contrast. CONTRAST:  ISOVUE-300 IOPAMIDOL (ISOVUE-300) INJECTION 61% COMPARISON:  None. FINDINGS: Lower chest: Mild scattered subsegmental atelectatic changes present at the lung bases. Visualized lungs are otherwise clear. Hepatobiliary: Diffuse hypoattenuation liver consistent with steatosis. Areas of focal fatty sparing noted adjacent to the gallbladder fossa. Liver otherwise unremarkable. Gallbladder within normal limits. No imaging findings to suggest cholelithiasis or acute cholecystitis. No biliary dilatation. Pancreas: Pancreas within normal limits. Focal fat deposition noted at the level of the uncinate process. Spleen: Spleen within normal limits. Adrenals/Urinary Tract: Adrenal glands are normal. Kidneys equal in size with symmetric enhancement. No nephrolithiasis, hydronephrosis, or focal enhancing renal mass. No hydroureter. Partially distended bladder within normal limits. Stomach/Bowel: Stomach within normal limits. No evidence for bowel obstruction. Appendix is surgically absent. No acute inflammatory changes seen about the bowels. Vascular/Lymphatic: Normal intravascular enhancement seen throughout the intra-abdominal aorta and its branch vessels. Mild atheromatous plaque about the aortic bifurcation. No pathologically enlarged lymph nodes identified  within the abdomen and pelvis. Reproductive: Prostate within normal limits. Other: No free air or fluid. Musculoskeletal: No acute osseous abnormality. No discrete lytic or blastic osseous lesions. Postsurgical scarring noted within the subcutaneous fat of the right ventral abdomen. Minimal hazy stranding noted within the subcutaneous fat of the lower mid ventral abdomen (series 3, image 102), nonspecific. No loculated collection. IMPRESSION: 1. No CT evidence for acute intra-abdominal or pelvic process. 2. Mild hazy stranding within the subcutaneous fat of the lower mid ventral abdomen, nonspecific, but could reflect mild infection/cellulitis. Correlation with physical exam recommended. No loculated collections. 3. Hepatic steatosis. 4. Prior appendectomy. Electronically Signed   By: Rise Mu M.D.   On: 08/14/2018 19:52   US Abdomen Limited Ruq  Result Date: 08/14/2018 CLINICAL DATA:  Right upper abdominal tenderness EXAM: ULTRASOUND ABDOMEN LIMITED RIGHT UPPER QUADRANT COMPARISON:  None. FINDINGS: Gallbladder: No gallstones or wall thickening visualized. No sonographic Murphy sign noted by sonographer. Common bile duct: Diameter: 6.5 mm Liver: No focal lesion identified. Increased hepatic parenchymal echogenicity. Portal vein is patent on color Doppler imaging with normal direction of blood flow towards the liver. IMPRESSION: 1. No cholelithiasis or sonographic evidence of acute cholecystitis. 2. Increased hepatic echogenicity as can be seen with hepatic steatosis. Electronically Signed   By: Elige Ko   On: 08/14/2018 17:52    Procedures Procedures (including critical care time)  Medications Ordered in ED Medications  nicotine (NICODERM CQ - dosed in mg/24 hours) patch 21 mg (21 mg Transdermal Patch Applied 08/14/18 1931)  LORazepam (ATIVAN) injection 0-4 mg (2 mg Intravenous Given 08/14/18 1939)    Or  LORazepam (ATIVAN) tablet 0-4 mg ( Oral See Alternative 08/14/18 1939)    LORazepam (ATIVAN) injection 0-4 mg (has no administration in time range)    Or  LORazepam (ATIVAN) tablet 0-4 mg (has no administration in time range)  thiamine (VITAMIN B-1) tablet 100 mg ( Oral See Alternative 08/14/18 1932)    Or  thiamine (B-1) injection 100 mg (100 mg Intravenous Given 08/14/18 1932)  sodium chloride 0.9 % bolus 1,000 mL (0 mLs Intravenous Stopped 08/14/18 1818)  ondansetron (ZOFRAN) injection 4 mg (4 mg Intravenous Given 08/14/18 1545)  morphine 4 MG/ML injection 4 mg (4 mg Intravenous Given 08/14/18 1623)  iopamidol (ISOVUE-300) 61 % injection 100 mL (100 mLs Intravenous Contrast Given 08/14/18 1858)  sodium chloride (PF) 0.9 % injection (  Given by Other 08/14/18 1932)     Initial Impression / Assessment and Plan / ED Course  I have reviewed the triage vital signs and the nursing notes.  Pertinent labs & imaging results that were available during my care of the patient were reviewed by me and considered in my medical decision making (see chart for details).  Clinical Course as of Aug 14 2318  Wed Aug 14, 2018  1839 Patient states he feels better.   [SJ]  2103 Patient states he is due for his Cardizem.  He states he will take it when he gets home.  BP(!): 182/105 [SJ]    Clinical Course User Index [SJ] [name], [name], PA-C    Patient presents with right upper quadrant abdominal pain with associated pain in the right shoulder and right flank.  Right upper quadrant tenderness on exam.  No CVA tenderness.  No acute abnormalities on ultrasound or CT.  Lab work overall reassuring.  Patient will follow-up with general surgery due to concern for biliary colic. My suspicion for cardiac or pulmonary source of the patient's symptoms is low.  Delta troponins negative.  No acute EKG changes.  No acute abnormalities on chest x-ray. PERC negative.     Final Clinical Impressions(s) / ED Diagnoses   Final diagnoses:  RUQ abdominal tenderness    ED Discharge Orders          Ordered    methocarbamol (ROBAXIN) 500 MG tablet  2 times daily     08/14/18 2102    dicyclomine (BENTYL) 20 MG tablet  2 times daily     08/14/18 2102    ondansetron (ZOFRAN-ODT) 8 MG disintegrating tablet  Every 8 hours PRN     08/14/18 2102           Anselm Pancoast, PA-C 08/14/18 2325  Jacalyn Lefevre, MD 08/17/18 [phone]

## 2018-08-14 NOTE — ED Triage Notes (Signed)
Pt reports severe arm Pain in his right arm. Pt reports he is also having abdominal pain, SOB, and nausea.  Pt reports his primary thinks he is having galbladder attacks. Pt reportedly took 3 nitroglycerin tablets last night. Pt reports he can drink a 12 pack a day and smokes a pack of cigarettes a day

## 2018-08-14 NOTE — ED Notes (Signed)
Patient transported to CT 

## 2018-08-14 NOTE — Discharge Instructions (Signed)
°  Hand washing: Wash your hands throughout the day, but especially before and after touching the face, using the restroom, sneezing, coughing, or touching surfaces that have been coughed or sneezed upon. Hydration: Symptoms will be intensified and complicated by dehydration. Dehydration can also extend the duration of symptoms. Drink plenty of fluids and get plenty of rest. You should be drinking at least half a liter of water an hour to stay hydrated. Electrolyte drinks (ex. Gatorade, Powerade, Pedialyte) are also encouraged. You should be drinking enough fluids to make your urine light yellow, almost clear. If this is not the case, you are not drinking enough water. Please note that some of the treatments indicated below will not be effective if you are not adequately hydrated. Diet: Please concentrate on hydration, however, you may introduce food slowly.  Start with a clear liquid diet, progressed to a full liquid diet, and then bland solids as you are able. Pain or fever: Ibuprofen, Naproxen, or Tylenol for pain or fever.  Nausea/vomiting: Use the Zofran for nausea or vomiting. Diarrhea: May use medications such as loperamide (Imodium) or Bismuth subsalicylate (Pepto-Bismol). Bentyl: This medication is what is known as an antispasmodic and is intended to help reduce abdominal discomfort. Robaxin: Robaxin is a muscle relaxer and can help with muscle spasms. Follow-up: Follow-up with the general surgeon on this matter. Return: Return should you develop a fever, bloody diarrhea, increased abdominal pain, uncontrolled vomiting, or any other major concerns.  For prescription assistance, may try using prescription discount sites or apps, such as goodrx.com

## 2018-10-13 ENCOUNTER — Inpatient Hospital Stay (HOSPITAL_COMMUNITY)
Admission: EM | Admit: 2018-10-13 | Discharge: 2018-10-16 | DRG: 247 | Disposition: A | Discharge status: Home / Self Care | Payer: BLUE CROSS/BLUE SHIELD | Attending: Internal Medicine | Admitting: Internal Medicine

## 2018-10-13 ENCOUNTER — Other Ambulatory Visit: Payer: Self-pay

## 2018-10-13 ENCOUNTER — Emergency Department (HOSPITAL_COMMUNITY): Payer: BLUE CROSS/BLUE SHIELD

## 2018-10-13 DIAGNOSIS — R778 Other specified abnormalities of plasma proteins: Secondary | ICD-10-CM

## 2018-10-13 DIAGNOSIS — Z79891 Long term (current) use of opiate analgesic: Secondary | ICD-10-CM

## 2018-10-13 DIAGNOSIS — K219 Gastro-esophageal reflux disease without esophagitis: Secondary | ICD-10-CM | POA: Diagnosis present

## 2018-10-13 DIAGNOSIS — F411 Generalized anxiety disorder: Secondary | ICD-10-CM | POA: Diagnosis present

## 2018-10-13 DIAGNOSIS — Z955 Presence of coronary angioplasty implant and graft: Secondary | ICD-10-CM

## 2018-10-13 DIAGNOSIS — R079 Chest pain, unspecified: Secondary | ICD-10-CM | POA: Diagnosis present

## 2018-10-13 DIAGNOSIS — L4050 Arthropathic psoriasis, unspecified: Secondary | ICD-10-CM | POA: Diagnosis present

## 2018-10-13 DIAGNOSIS — K760 Fatty (change of) liver, not elsewhere classified: Secondary | ICD-10-CM | POA: Diagnosis present

## 2018-10-13 DIAGNOSIS — I214 Non-ST elevation (NSTEMI) myocardial infarction: Principal | ICD-10-CM | POA: Diagnosis present

## 2018-10-13 DIAGNOSIS — Z716 Tobacco abuse counseling: Secondary | ICD-10-CM

## 2018-10-13 DIAGNOSIS — Z6841 Body Mass Index (BMI) 40.0 and over, adult: Secondary | ICD-10-CM

## 2018-10-13 DIAGNOSIS — I480 Paroxysmal atrial fibrillation: Secondary | ICD-10-CM | POA: Diagnosis present

## 2018-10-13 DIAGNOSIS — I10 Essential (primary) hypertension: Secondary | ICD-10-CM | POA: Diagnosis present

## 2018-10-13 DIAGNOSIS — R7989 Other specified abnormal findings of blood chemistry: Secondary | ICD-10-CM

## 2018-10-13 DIAGNOSIS — J449 Chronic obstructive pulmonary disease, unspecified: Secondary | ICD-10-CM | POA: Diagnosis present

## 2018-10-13 DIAGNOSIS — G8929 Other chronic pain: Secondary | ICD-10-CM | POA: Diagnosis present

## 2018-10-13 DIAGNOSIS — M5126 Other intervertebral disc displacement, lumbar region: Secondary | ICD-10-CM | POA: Diagnosis present

## 2018-10-13 DIAGNOSIS — Z7951 Long term (current) use of inhaled steroids: Secondary | ICD-10-CM

## 2018-10-13 DIAGNOSIS — R0789 Other chest pain: Secondary | ICD-10-CM

## 2018-10-13 DIAGNOSIS — F17210 Nicotine dependence, cigarettes, uncomplicated: Secondary | ICD-10-CM | POA: Diagnosis present

## 2018-10-13 DIAGNOSIS — Z888 Allergy status to other drugs, medicaments and biological substances status: Secondary | ICD-10-CM

## 2018-10-13 DIAGNOSIS — E119 Type 2 diabetes mellitus without complications: Secondary | ICD-10-CM | POA: Diagnosis present

## 2018-10-13 DIAGNOSIS — F1010 Alcohol abuse, uncomplicated: Secondary | ICD-10-CM | POA: Diagnosis present

## 2018-10-13 DIAGNOSIS — I2510 Atherosclerotic heart disease of native coronary artery without angina pectoris: Secondary | ICD-10-CM | POA: Diagnosis present

## 2018-10-13 DIAGNOSIS — F329 Major depressive disorder, single episode, unspecified: Secondary | ICD-10-CM | POA: Diagnosis present

## 2018-10-13 DIAGNOSIS — M549 Dorsalgia, unspecified: Secondary | ICD-10-CM | POA: Diagnosis present

## 2018-10-13 DIAGNOSIS — Z8249 Family history of ischemic heart disease and other diseases of the circulatory system: Secondary | ICD-10-CM

## 2018-10-13 DIAGNOSIS — E782 Mixed hyperlipidemia: Secondary | ICD-10-CM | POA: Diagnosis present

## 2018-10-13 HISTORY — DX: Depression, unspecified: F32.A

## 2018-10-13 HISTORY — DX: Anxiety disorder, unspecified: F41.9

## 2018-10-13 HISTORY — DX: Personal history of other diseases of the circulatory system: Z86.79

## 2018-10-13 HISTORY — DX: Major depressive disorder, single episode, unspecified: F32.9

## 2018-10-13 HISTORY — DX: Gastro-esophageal reflux disease without esophagitis: K21.9

## 2018-10-13 LAB — CBC
HCT: 46.4 % (ref 39.0–52.0)
Hemoglobin: 15.2 g/dL (ref 13.0–17.0)
MCH: 30.7 pg (ref 26.0–34.0)
MCHC: 32.8 g/dL (ref 30.0–36.0)
MCV: 93.7 fL (ref 80.0–100.0)
Platelets: 196 10*3/uL (ref 150–400)
RBC: 4.95 MIL/uL (ref 4.22–5.81)
RDW: 12.9 % (ref 11.5–15.5)
WBC: 6.6 10*3/uL (ref 4.0–10.5)
nRBC: 0 % (ref 0.0–0.2)

## 2018-10-13 NOTE — ED Triage Notes (Signed)
Pt brought in by [facility] for c/o chest pain that started tonight; pt administered 4 sl nitro pta of ems with no relief and took one with ems and still no pain relief; pt states the pain radiates to his right arm and shoulder;

## 2018-10-14 ENCOUNTER — Emergency Department (HOSPITAL_COMMUNITY): Payer: BLUE CROSS/BLUE SHIELD

## 2018-10-14 ENCOUNTER — Encounter (HOSPITAL_COMMUNITY): Payer: Self-pay | Admitting: Cardiology

## 2018-10-14 DIAGNOSIS — Z888 Allergy status to other drugs, medicaments and biological substances status: Secondary | ICD-10-CM | POA: Diagnosis not present

## 2018-10-14 DIAGNOSIS — R079 Chest pain, unspecified: Secondary | ICD-10-CM | POA: Diagnosis not present

## 2018-10-14 DIAGNOSIS — J449 Chronic obstructive pulmonary disease, unspecified: Secondary | ICD-10-CM | POA: Diagnosis not present

## 2018-10-14 DIAGNOSIS — Z6841 Body Mass Index (BMI) 40.0 and over, adult: Secondary | ICD-10-CM | POA: Diagnosis not present

## 2018-10-14 DIAGNOSIS — Z7951 Long term (current) use of inhaled steroids: Secondary | ICD-10-CM | POA: Diagnosis not present

## 2018-10-14 DIAGNOSIS — R0789 Other chest pain: Secondary | ICD-10-CM | POA: Diagnosis present

## 2018-10-14 DIAGNOSIS — I2510 Atherosclerotic heart disease of native coronary artery without angina pectoris: Secondary | ICD-10-CM | POA: Diagnosis not present

## 2018-10-14 DIAGNOSIS — F17210 Nicotine dependence, cigarettes, uncomplicated: Secondary | ICD-10-CM | POA: Diagnosis not present

## 2018-10-14 DIAGNOSIS — R7989 Other specified abnormal findings of blood chemistry: Secondary | ICD-10-CM

## 2018-10-14 DIAGNOSIS — F411 Generalized anxiety disorder: Secondary | ICD-10-CM | POA: Diagnosis not present

## 2018-10-14 DIAGNOSIS — G8929 Other chronic pain: Secondary | ICD-10-CM | POA: Diagnosis not present

## 2018-10-14 DIAGNOSIS — Z716 Tobacco abuse counseling: Secondary | ICD-10-CM | POA: Diagnosis not present

## 2018-10-14 DIAGNOSIS — M5126 Other intervertebral disc displacement, lumbar region: Secondary | ICD-10-CM | POA: Diagnosis not present

## 2018-10-14 DIAGNOSIS — E119 Type 2 diabetes mellitus without complications: Secondary | ICD-10-CM | POA: Diagnosis not present

## 2018-10-14 DIAGNOSIS — K760 Fatty (change of) liver, not elsewhere classified: Secondary | ICD-10-CM | POA: Diagnosis not present

## 2018-10-14 DIAGNOSIS — E782 Mixed hyperlipidemia: Secondary | ICD-10-CM | POA: Diagnosis not present

## 2018-10-14 DIAGNOSIS — I214 Non-ST elevation (NSTEMI) myocardial infarction: Secondary | ICD-10-CM | POA: Diagnosis not present

## 2018-10-14 DIAGNOSIS — I10 Essential (primary) hypertension: Secondary | ICD-10-CM

## 2018-10-14 DIAGNOSIS — Z79891 Long term (current) use of opiate analgesic: Secondary | ICD-10-CM | POA: Diagnosis not present

## 2018-10-14 DIAGNOSIS — R072 Precordial pain: Secondary | ICD-10-CM

## 2018-10-14 DIAGNOSIS — Z8249 Family history of ischemic heart disease and other diseases of the circulatory system: Secondary | ICD-10-CM

## 2018-10-14 DIAGNOSIS — I480 Paroxysmal atrial fibrillation: Secondary | ICD-10-CM | POA: Diagnosis not present

## 2018-10-14 DIAGNOSIS — L4050 Arthropathic psoriasis, unspecified: Secondary | ICD-10-CM | POA: Diagnosis not present

## 2018-10-14 DIAGNOSIS — R0609 Other forms of dyspnea: Secondary | ICD-10-CM | POA: Diagnosis not present

## 2018-10-14 DIAGNOSIS — F1010 Alcohol abuse, uncomplicated: Secondary | ICD-10-CM | POA: Diagnosis not present

## 2018-10-14 DIAGNOSIS — F329 Major depressive disorder, single episode, unspecified: Secondary | ICD-10-CM | POA: Diagnosis not present

## 2018-10-14 DIAGNOSIS — K219 Gastro-esophageal reflux disease without esophagitis: Secondary | ICD-10-CM | POA: Diagnosis not present

## 2018-10-14 LAB — COMPREHENSIVE METABOLIC PANEL
ALT: 45 U/L — ABNORMAL HIGH (ref 0–44)
AST: 29 U/L (ref 15–41)
Albumin: 4 g/dL (ref 3.5–5.0)
Alkaline Phosphatase: 32 U/L — ABNORMAL LOW (ref 38–126)
Anion gap: 12 (ref 5–15)
BILIRUBIN TOTAL: 0.6 mg/dL (ref 0.3–1.2)
BUN: 12 mg/dL (ref 6–20)
CO2: 23 mmol/L (ref 22–32)
CREATININE: 0.77 mg/dL (ref 0.61–1.24)
Calcium: 9.2 mg/dL (ref 8.9–10.3)
Chloride: 101 mmol/L (ref 98–111)
GFR calc Af Amer: >60 mL/min (ref >60)
GFR calc non Af Amer: >60 mL/min (ref >60)
Glucose, Bld: 111 mg/dL — ABNORMAL HIGH (ref 70–99)
Potassium: 3.9 mmol/L (ref 3.5–5.1)
Sodium: 136 mmol/L (ref 135–145)
Total Protein: 7.5 g/dL (ref 6.5–8.1)

## 2018-10-14 LAB — HEPATIC FUNCTION PANEL
ALT: 43 U/L (ref 0–44)
AST: 23 U/L (ref 15–41)
Albumin: 3.9 g/dL (ref 3.5–5.0)
Alkaline Phosphatase: 32 U/L — ABNORMAL LOW (ref 38–126)
Bilirubin, Direct: <0.1 mg/dL (ref 0.0–0.2)
Total Bilirubin: 0.3 mg/dL (ref 0.3–1.2)
Total Protein: 7.3 g/dL (ref 6.5–8.1)

## 2018-10-14 LAB — TROPONIN I
Troponin I: 0.03 ng/mL (ref <0.03)
Troponin I: 0.1 ng/mL (ref <0.03)
Troponin I: <0.03 ng/mL (ref <0.03)

## 2018-10-14 LAB — CBC
HCT: 49.4 % (ref 39.0–52.0)
Hemoglobin: 15.7 g/dL (ref 13.0–17.0)
MCH: 30.4 pg (ref 26.0–34.0)
MCHC: 31.8 g/dL (ref 30.0–36.0)
MCV: 95.6 fL (ref 80.0–100.0)
Platelets: 200 10*3/uL (ref 150–400)
RBC: 5.17 MIL/uL (ref 4.22–5.81)
RDW: 13.2 % (ref 11.5–15.5)
WBC: 6.4 10*3/uL (ref 4.0–10.5)
nRBC: 0 % (ref 0.0–0.2)

## 2018-10-14 LAB — BASIC METABOLIC PANEL
ANION GAP: 11 (ref 5–15)
BUN: 15 mg/dL (ref 6–20)
CO2: 23 mmol/L (ref 22–32)
Calcium: 8.4 mg/dL — ABNORMAL LOW (ref 8.9–10.3)
Chloride: 97 mmol/L — ABNORMAL LOW (ref 98–111)
Creatinine, Ser: 0.88 mg/dL (ref 0.61–1.24)
GFR calc Af Amer: >60 mL/min (ref >60)
GLUCOSE: 197 mg/dL — AB (ref 70–99)
Potassium: 3.4 mmol/L — ABNORMAL LOW (ref 3.5–5.1)
Sodium: 131 mmol/L — ABNORMAL LOW (ref 135–145)

## 2018-10-14 LAB — D-DIMER, QUANTITATIVE: D-Dimer, Quant: 0.31 ug/mL-FEU (ref 0.00–0.50)

## 2018-10-14 LAB — LIPASE, BLOOD: Lipase: 31 U/L (ref 11–51)

## 2018-10-14 LAB — I-STAT TROPONIN, ED: Troponin i, poc: 0 ng/mL (ref 0.00–0.08)

## 2018-10-14 MED ORDER — FLUTICASONE FUROATE-VILANTEROL 100-25 MCG/INH IN AEPB
1.0000 | INHALATION_SPRAY | Freq: Every day | RESPIRATORY_TRACT | Status: DC
  Administered 2018-10-14 – 2018-10-16 (×2): 1 via RESPIRATORY_TRACT
  Filled 2018-10-14 (×2): qty 28

## 2018-10-14 MED ORDER — ALPRAZOLAM 0.5 MG PO TABS
0.5000 mg | ORAL_TABLET | Freq: Three times a day (TID) | ORAL | Status: DC | PRN
  Administered 2018-10-14 – 2018-10-16 (×5): 0.5 mg via ORAL
  Filled 2018-10-14 (×5): qty 1

## 2018-10-14 MED ORDER — THIAMINE HCL 100 MG/ML IJ SOLN
100.0000 mg | Freq: Every day | INTRAMUSCULAR | Status: DC
  Filled 2018-10-14: qty 1

## 2018-10-14 MED ORDER — NICOTINE 21 MG/24HR TD PT24
21.0000 mg | MEDICATED_PATCH | Freq: Every day | TRANSDERMAL | Status: DC
  Administered 2018-10-14 – 2018-10-16 (×2): 21 mg via TRANSDERMAL
  Filled 2018-10-14 (×4): qty 1

## 2018-10-14 MED ORDER — FAMOTIDINE 20 MG PO TABS
40.0000 mg | ORAL_TABLET | Freq: Every day | ORAL | Status: DC
  Administered 2018-10-14 – 2018-10-15 (×2): 40 mg via ORAL
  Filled 2018-10-14 (×2): qty 2

## 2018-10-14 MED ORDER — SODIUM CHLORIDE 0.9% FLUSH
3.0000 mL | Freq: Two times a day (BID) | INTRAVENOUS | Status: DC
  Administered 2018-10-14: 3 mL via INTRAVENOUS

## 2018-10-14 MED ORDER — SODIUM CHLORIDE 0.9 % WEIGHT BASED INFUSION
3.0000 mL/kg/h | INTRAVENOUS | Status: DC
  Administered 2018-10-15: 3 mL/kg/h via INTRAVENOUS

## 2018-10-14 MED ORDER — SODIUM CHLORIDE 0.9 % IV SOLN: 250.0000 mL | INTRAVENOUS | Status: DC | PRN

## 2018-10-14 MED ORDER — SODIUM CHLORIDE 0.9 % WEIGHT BASED INFUSION
1.0000 mL/kg/h | INTRAVENOUS | Status: DC
  Administered 2018-10-15: 1 mL/kg/h via INTRAVENOUS

## 2018-10-14 MED ORDER — VITAMIN B-1 100 MG PO TABS
100.0000 mg | ORAL_TABLET | Freq: Every day | ORAL | Status: DC
  Administered 2018-10-15 – 2018-10-16 (×2): 100 mg via ORAL
  Filled 2018-10-14 (×3): qty 1

## 2018-10-14 MED ORDER — ACETAMINOPHEN 325 MG PO TABS: 650.0000 mg | ORAL_TABLET | Freq: Four times a day (QID) | ORAL | Status: DC | PRN

## 2018-10-14 MED ORDER — LORAZEPAM 2 MG/ML IJ SOLN: 1.0000 mg | Freq: Four times a day (QID) | INTRAMUSCULAR | Status: DC | PRN

## 2018-10-14 MED ORDER — ASPIRIN 81 MG PO CHEW
324.0000 mg | CHEWABLE_TABLET | Freq: Once | ORAL | Status: AC
  Administered 2018-10-14: 324 mg via ORAL
  Filled 2018-10-14: qty 4

## 2018-10-14 MED ORDER — ADULT MULTIVITAMIN W/MINERALS CH
1.0000 | ORAL_TABLET | Freq: Every day | ORAL | Status: DC
  Administered 2018-10-15 – 2018-10-16 (×2): 1 via ORAL
  Filled 2018-10-14 (×3): qty 1

## 2018-10-14 MED ORDER — ONDANSETRON HCL 4 MG PO TABS: 4.0000 mg | ORAL_TABLET | Freq: Four times a day (QID) | ORAL | Status: DC | PRN

## 2018-10-14 MED ORDER — DILTIAZEM HCL ER COATED BEADS 240 MG PO CP24
240.0000 mg | ORAL_CAPSULE | Freq: Every evening | ORAL | Status: DC
  Administered 2018-10-14: 240 mg via ORAL
  Filled 2018-10-14 (×2): qty 1

## 2018-10-14 MED ORDER — SODIUM CHLORIDE 0.9% FLUSH: 3.0000 mL | INTRAVENOUS | Status: DC | PRN

## 2018-10-14 MED ORDER — ASPIRIN 81 MG PO CHEW
81.0000 mg | CHEWABLE_TABLET | ORAL | Status: AC
  Administered 2018-10-15: 81 mg via ORAL
  Filled 2018-10-14: qty 1

## 2018-10-14 MED ORDER — POTASSIUM CHLORIDE CRYS ER 20 MEQ PO TBCR
20.0000 meq | EXTENDED_RELEASE_TABLET | Freq: Once | ORAL | Status: AC
  Administered 2018-10-14: 20 meq via ORAL
  Filled 2018-10-14: qty 1

## 2018-10-14 MED ORDER — PANTOPRAZOLE SODIUM 40 MG PO TBEC
40.0000 mg | DELAYED_RELEASE_TABLET | Freq: Every day | ORAL | Status: DC
  Filled 2018-10-14 (×2): qty 1

## 2018-10-14 MED ORDER — FOLIC ACID 1 MG PO TABS
1.0000 mg | ORAL_TABLET | Freq: Every day | ORAL | Status: DC
  Administered 2018-10-15 – 2018-10-16 (×2): 1 mg via ORAL
  Filled 2018-10-14 (×3): qty 1

## 2018-10-14 MED ORDER — ENOXAPARIN SODIUM 40 MG/0.4ML ~~LOC~~ SOLN
40.0000 mg | SUBCUTANEOUS | Status: DC
  Administered 2018-10-14: 40 mg via SUBCUTANEOUS
  Filled 2018-10-14: qty 0.4

## 2018-10-14 MED ORDER — ACETAMINOPHEN 650 MG RE SUPP: 650.0000 mg | Freq: Four times a day (QID) | RECTAL | Status: DC | PRN

## 2018-10-14 MED ORDER — SUCRALFATE 1 GM/10ML PO SUSP
1.0000 g | Freq: Three times a day (TID) | ORAL | Status: DC
  Administered 2018-10-14 – 2018-10-16 (×7): 1 g via ORAL
  Filled 2018-10-14 (×9): qty 10

## 2018-10-14 MED ORDER — IOPAMIDOL (ISOVUE-300) INJECTION 61%
100.0000 mL | Freq: Once | INTRAVENOUS | Status: AC | PRN
  Administered 2018-10-14: 100 mL via INTRAVENOUS

## 2018-10-14 MED ORDER — PANTOPRAZOLE SODIUM 40 MG IV SOLR
40.0000 mg | Freq: Once | INTRAVENOUS | Status: DC
  Filled 2018-10-14: qty 40

## 2018-10-14 MED ORDER — ENOXAPARIN SODIUM 80 MG/0.8ML ~~LOC~~ SOLN: 80.0000 mg | SUBCUTANEOUS | Status: DC

## 2018-10-14 MED ORDER — TRAZODONE HCL 50 MG PO TABS
150.0000 mg | ORAL_TABLET | Freq: Every day | ORAL | Status: DC
  Administered 2018-10-14 – 2018-10-15 (×2): 150 mg via ORAL
  Filled 2018-10-14: qty 1
  Filled 2018-10-14: qty 3

## 2018-10-14 MED ORDER — VALSARTAN-HYDROCHLOROTHIAZIDE 320-25 MG PO TABS: 1.0000 | ORAL_TABLET | Freq: Every day | ORAL | Status: DC

## 2018-10-14 MED ORDER — HYDROCHLOROTHIAZIDE 25 MG PO TABS
25.0000 mg | ORAL_TABLET | Freq: Every day | ORAL | Status: DC
  Administered 2018-10-14 – 2018-10-16 (×3): 25 mg via ORAL
  Filled 2018-10-14 (×4): qty 1

## 2018-10-14 MED ORDER — MORPHINE SULFATE (PF) 2 MG/ML IV SOLN
1.0000 mg | INTRAVENOUS | Status: DC | PRN
  Administered 2018-10-14 (×2): 1 mg via INTRAVENOUS
  Filled 2018-10-14 (×2): qty 1

## 2018-10-14 MED ORDER — LORAZEPAM 1 MG PO TABS: 1.0000 mg | ORAL_TABLET | Freq: Four times a day (QID) | ORAL | Status: DC | PRN

## 2018-10-14 MED ORDER — HYDROCODONE-ACETAMINOPHEN 10-325 MG PO TABS
1.0000 | ORAL_TABLET | Freq: Three times a day (TID) | ORAL | Status: DC | PRN
  Administered 2018-10-14 – 2018-10-16 (×5): 1 via ORAL
  Filled 2018-10-14 (×5): qty 1

## 2018-10-14 MED ORDER — IRBESARTAN 300 MG PO TABS
300.0000 mg | ORAL_TABLET | Freq: Every day | ORAL | Status: DC
  Administered 2018-10-14 – 2018-10-16 (×3): 300 mg via ORAL
  Filled 2018-10-14 (×3): qty 1
  Filled 2018-10-14: qty 2
  Filled 2018-10-14 (×2): qty 1

## 2018-10-14 MED ORDER — LAMOTRIGINE 25 MG PO TABS
50.0000 mg | ORAL_TABLET | Freq: Every day | ORAL | Status: DC
  Administered 2018-10-14 – 2018-10-16 (×2): 50 mg via ORAL
  Filled 2018-10-14: qty 1
  Filled 2018-10-14 (×2): qty 2

## 2018-10-14 MED ORDER — ONDANSETRON HCL 4 MG/2ML IJ SOLN: 4.0000 mg | Freq: Four times a day (QID) | INTRAMUSCULAR | Status: DC | PRN

## 2018-10-14 NOTE — ED Provider Notes (Signed)
[facility] EMERGENCY DEPARTMENT Provider Note   CSN: [id number] Arrival date & time: 10/13/18  2317     History   Chief Complaint Chief Complaint  Patient presents with  . Chest Pain    HPI Scott Waller is a 38 y.o. male.  Patient with history of obesity, hypertension, psoriatic arthritis on Biologics presenting with episode of right-sided chest pain, upper arm pain and back pain that came on after he went to lie down tonight.  States he has had this pain in the past which has been attributed to "esophageal spasm".  He has not seen a GI doctor for this.  He has nitroglycerin at home for this which she took tonight without relief.  States he took about 5 of these and is now having no pain.  He reports he normally has to take one at night when he gets this pain and it last about 10 minutes but tonight it was not going away.  Reports he is had pain in his upper back, chest and right arm intermittently for the past several months.  This episode was similar but lasted longer.  Reports he stopped his Protonix because he felt it was making his arm pain worse.  Denies any weakness or numbness in the arm.  He does have some upper abdominal pain at the time which is since resolved.  Had a work-up for gallstones last fall which was negative.  Reports a negative stress test about a year and a half ago.  The history is provided by the patient and the EMS personnel.  Chest Pain  Associated symptoms: back pain, nausea and shortness of breath   Associated symptoms: no abdominal pain, no dizziness, no fever, no headache, no vomiting and no weakness     Past Medical History:  Diagnosis Date  . A-fib (HCC)   . Arthritis   . Asthma   . Bronchitis   . Depression   . Hypertension     Patient Active Problem List   Diagnosis Date Noted  . Back pain 11/24/2016  . Essential hypertension 11/24/2016  . Herniated lumbar intervertebral disc 07/18/2016  . Generalized anxiety disorder 08/31/2014     Past Surgical History:  Procedure Laterality Date  . APPENDECTOMY          Home Medications    Prior to Admission medications   Medication Sig Start Date End Date Taking? Authorizing Provider  albuterol (PROVENTIL) (2.5 MG/3ML) 0.083% nebulizer solution Take 3 mLs (2.5 mg total) by nebulization every 4 (four) hours as needed for wheezing or shortness of breath. 09/28/17   [name], MD  ALPRAZolam Prudy Feeler) 0.5 MG tablet Take 0.5 mg by mouth 3 (three) times daily as needed for anxiety.  11/07/16   [provider]  BREO ELLIPTA 100-25 MCG/INH AEPB Take 1 puff by mouth daily. 07/26/18   [provider]  CARAFATE 1 GM/10ML suspension Take 10 mLs by mouth 4 (four) times daily -  with meals and at bedtime. 08/05/18   [provider]  Clobetasol Propionate (TEMOVATE) 0.05 % external spray Apply 1 application topically 2 (two) times daily as needed. 08/12/18   [provider]  COSENTYX SENSOREADY, 300 MG, 150 MG/ML SOAJ Inject 150 mg into the skin every 30 (thirty) days. 07/29/18   [provider]  dicyclomine (BENTYL) 20 MG tablet Take 1 tablet (20 mg total) by mouth 2 (two) times daily. 08/14/18   [name], [name], PA-C  diltiazem (CARDIZEM CD) 240 MG 24 hr capsule  Take 240 mg by mouth every evening.  05/12/16   [provider]  fluticasone (FLONASE) 50 MCG/ACT nasal spray Place 2 sprays into both nostrils daily. 08/10/18   [provider]  HYDROcodone-acetaminophen (NORCO) 10-325 MG tablet Take 1 tablet by mouth 3 (three) times daily.  11/07/16   [provider]  lamoTRIgine (LAMICTAL) 25 MG tablet Take 1 tablet (25 mg total) by mouth daily. Take one tablet daily for a week and then start taking 2 tablets. Patient taking differently: Take 50 mg by mouth daily.  11/24/16   [name], MD  methocarbamol (ROBAXIN) 500 MG tablet Take 1 tablet (500 mg total) by mouth 2 (two) times daily. 08/14/18   [name], [name], PA-C  Multiple  Vitamins-Minerals (MULTIVITAMIN MEN PO) Take 1 tablet by mouth daily.    [provider]  nitroGLYCERIN (NITROSTAT) 0.4 MG SL tablet Place 0.4 mg under the tongue every 5 (five) minutes x 3 doses as needed. If no improvement, call 911 08/08/18   [provider]  ondansetron (ZOFRAN-ODT) 8 MG disintegrating tablet Take 8 mg by mouth 3 (three) times daily as needed for nausea or vomiting.  08/05/18   [provider]  ondansetron (ZOFRAN-ODT) 8 MG disintegrating tablet Take 1 tablet (8 mg total) by mouth every 8 (eight) hours as needed for nausea or vomiting. 08/14/18   [name], [name], PA-C  pantoprazole (PROTONIX) 40 MG tablet Take 40 mg by mouth 2 (two) times daily. 08/08/18   [provider]  predniSONE (DELTASONE) 50 MG tablet One tablet PO daily for 4 days Patient not taking: Reported on 08/14/2018 09/28/17   [name], MD  traZODone (DESYREL) 150 MG tablet Take 150 mg by mouth at bedtime.    [provider]  valsartan-hydrochlorothiazide (DIOVAN-HCT) 320-25 MG tablet Take 1 tablet by mouth daily. 06/10/18   [provider]    Family History No family history on file.  Social History Social History   Tobacco Use  . Smoking status: Current Every Day Smoker    Packs/day: 1.00    Years: 25.00    Pack years: 25.00    Types: Cigarettes  . Smokeless tobacco: Never Used  Substance Use Topics  . Alcohol use: Yes    Alcohol/week: 20.0 - 24.0 standard drinks    Types: 20 - 24 Cans of beer per week  . Drug use: No     Allergies   Desvenlafaxine   Review of Systems Review of Systems  Constitutional: Negative for activity change, appetite change and fever.  HENT: Negative for congestion and rhinorrhea.   Respiratory: Positive for chest tightness and shortness of breath.   Cardiovascular: Positive for chest pain.  Gastrointestinal: Positive for nausea. Negative for abdominal pain and vomiting.  Genitourinary: Negative for dysuria  and hematuria.  Musculoskeletal: Positive for arthralgias, back pain and myalgias.  Skin: Negative for rash.  Neurological: Negative for dizziness, weakness and headaches.    all other systems are negative except as noted in the HPI and PMH.    Physical Exam Updated Vital Signs BP 120/64 (BP Location: Left Arm)   Pulse 95   Temp 97.9 F (36.6 C) (Oral)   Resp 19   Ht 6\' 3"  (1.905 m)   Wt 131.5 kg   SpO2 91%   BMI 36.25 kg/m   Physical Exam Vitals signs and nursing note reviewed.  Constitutional:      General: He is not in acute distress.    Appearance: He is well-developed.  He is obese.  HENT:     Head: Normocephalic and atraumatic.     Mouth/Throat:     Pharynx: No oropharyngeal exudate.  Eyes:     Conjunctiva/sclera: Conjunctivae normal.     Pupils: Pupils are equal, round, and reactive to light.  Neck:     Musculoskeletal: Normal range of motion and neck supple.     Comments: No meningismus. Cardiovascular:     Rate and Rhythm: Normal rate and regular rhythm.     Heart sounds: Normal heart sounds. No murmur.  Pulmonary:     Effort: Pulmonary effort is normal. No respiratory distress.     Breath sounds: Normal breath sounds.  Abdominal:     Palpations: Abdomen is soft.     Tenderness: There is abdominal tenderness. There is no guarding or rebound.     Comments: Obese abdomen, right upper quadrant tenderness and epigastric tenderness without guarding or rebound.  Musculoskeletal: Normal range of motion.        General: No tenderness.  Skin:    General: Skin is warm.  Neurological:     Mental Status: He is alert and oriented to person, place, and time.     Cranial Nerves: No cranial nerve deficit.     Motor: No abnormal muscle tone.     Coordination: Coordination normal.     Comments: No ataxia on finger to nose bilaterally. No pronator drift. 5/5 strength throughout. CN 2-12 intact.Equal grip strength. Sensation intact.   Psychiatric:        Behavior:  Behavior normal.      ED Treatments / Results  Labs (all labs ordered are listed, but only abnormal results are displayed) Labs Reviewed  BASIC METABOLIC PANEL - Abnormal; Notable for the following components:      Result Value   Sodium 131 (*)    Potassium 3.4 (*)    Chloride 97 (*)    Glucose, Bld 197 (*)    Calcium 8.4 (*)    All other components within normal limits  HEPATIC FUNCTION PANEL - Abnormal; Notable for the following components:   Alkaline Phosphatase 32 (*)    All other components within normal limits  TROPONIN I - Abnormal; Notable for the following components:   Troponin I 0.03 (*)    All other components within normal limits  CBC  LIPASE, BLOOD  TROPONIN I  D-DIMER, QUANTITATIVE (NOT AT [facility])  I-STAT TROPONIN, ED    EKG EKG Interpretation  Date/Time:  Sunday October 13 2018 23:31:32 EST Ventricular Rate:  89 PR Interval:    QRS Duration: 107 QT Interval:  368 QTC Calculation: 448 R Axis:   -47 Text Interpretation:  Sinus rhythm S1,S2,S3 pattern RSR' in V1 or V2, probably normal variant No significant change was found Confirmed by Glynn Octaveancour, Netanya Yazdani [phone]([id number]) on 10/13/2018 11:35:29 PM   Radiology Dg Chest 2 View  Result Date: 10/14/2018 CLINICAL DATA:  Chest pain EXAM: CHEST - 2 VIEW COMPARISON:  08/14/2018 FINDINGS: Mild bibasilar atelectasis. No focal consolidation. No pleural effusion or pneumothorax. The heart is normal in size. Visualized osseous structures are within normal limits. IMPRESSION: Normal chest radiographs. Electronically Signed   By: Charline BillsSriyesh  Krishnan M.D.   On: 10/14/2018 00:11   Ct Abdomen Pelvis W Contrast  Result Date: 10/14/2018 CLINICAL DATA:  Right upper quadrant chest pain, prior appendectomy EXAM: CT ABDOMEN AND PELVIS WITH CONTRAST TECHNIQUE: Multidetector CT imaging of the abdomen and pelvis was performed using the standard protocol following bolus administration of intravenous  contrast. CONTRAST:  100mL ISOVUE-300 IOPAMIDOL  (ISOVUE-300) INJECTION 61% COMPARISON:  08/14/2018 FINDINGS: Lower chest: Lung bases are clear. Hepatobiliary: Moderate to severe geographic hepatic steatosis with focal fatty sparing along the gallbladder fossa. Gallbladder is underdistended unremarkable. No intrahepatic or extrahepatic ductal dilatation. Pancreas: Within normal limits. Spleen: Within normal limits. Adrenals/Urinary Tract: Adrenal glands are within normal limits. Kidneys are within normal limits.  No hydronephrosis. Bladder is within normal limits. Stomach/Bowel: Stomach is notable for a tiny hiatal hernia. No evidence of bowel obstruction. Prior appendectomy. Vascular/Lymphatic: No evidence of abdominal aortic aneurysm. No suspicious abdominopelvic lymphadenopathy. Reproductive: Prostate is unremarkable. Other: No abdominopelvic ascites. Musculoskeletal: Visualized osseous structures are within normal limits. IMPRESSION: Gallbladder is unremarkable on CT. Moderate to severe hepatic steatosis with focal fatty sparing. No evidence of bowel obstruction.  Prior appendectomy. No CT findings to account for the patient's right abdominal pain. Electronically Signed   By: Charline BillsSriyesh  Krishnan M.D.   On: 10/14/2018 02:30    Procedures Procedures (including critical care time)  Medications Ordered in ED Medications - No data to display   Initial Impression / Assessment and Plan / ED Course  I have reviewed the triage vital signs and the nursing notes.  Pertinent labs & imaging results that were available during my care of the patient were reviewed by me and considered in my medical decision making (see chart for details).    Episode of upper right-sided chest and back pain and arm pain similar to previous episodes of "esophageal spasm.  Now resolved after multiple nitroglycerin.  EKG without acute ST changes.  Labs reassuring with normal lipase, LFTs, and troponin.  CT scan shows normal gallbladder. Negative RUQ US in November.   Pain has  resolved but recurs with exertion when patient walked to restroom. No pain at rest.  Repeat EKG unchanged.  Second troponin minimally elevated at 0.03.  ASA given. Patient refuses protonix as he states it makes pain worse.  With exertional nature of pain and elevated troponin, plan observation admission for cardiac ruleout though presentation still suggest GI etiology. D/w Dr. Sharl MaLama. Final Clinical Impressions(s) / ED Diagnoses   Final diagnoses:  Atypical chest pain  Elevated troponin    ED Discharge Orders    None       Lilyian Quayle, Jeannett SeniorStephen, MD 10/14/18 [phone]

## 2018-10-14 NOTE — Consult Note (Addendum)
Cardiology Consultation:   Patient ID: Scott Waller MRN: [id number]; DOB: 10/10/1980  Admit date: 10/13/2018 Date of Consult: 10/14/2018  Primary Care Provider: Etta QuillBryan, Daniel J, PA-C Primary Cardiologist: Dr. Vilinda BoehringerGary Renaldo [facility]) Primary Electrophysiologist:  None    Patient Profile:   Scott Waller is a 38 y.o. male with a history of recurrent chest pain, prior episode of atrial fibrillation in 2011 in the setting of alcohol binge, morbid obesity, tobacco abuse, possible diabetes mellitus, and reflux who is being seen today for the evaluation of chest pain and shortness of breath at the request of [facility].  History of Present Illness:   Scott Waller is a 38 year old male patient with history of hypertension, diabetes, obesity, family history of CAD with grandfather dying in his 5250s of an MI another grandfather MI in his 7060s, tobacco abuse with 20-pack-year history of smoking, EtOH drinks a sixpack daily, a lot of anxiety.  Had AF single episode in 2011 related to alcohol binge.  Negative stress Cardiolite in 2016.  He last saw Dr. Vilinda BoehringerGary Renaldo, cardiologist with [facility] 03/2017.  Was to have stress test at that time although not clear that this was done based on chart review.  He has been on nitroglycerin over the last few years.  Patient complains of several week history of progressive worsening of chest pain.  He describes right-sided chest pain into his right arm and right shoulder blade with very little activity as well as dyspnea on exertion.  He also has a lot of GI symptoms especially when he lays down at night.  He says Protonix makes it a lot worse but helps his indigestion.  He does better with Zantac.  Last night after eating and laying down he developed severe chest pressure unrelieved with 4 nitroglycerin.  Usually his pain is relieved with 2 nitroglycerin.  Troponins negative, EKG unchanged.  Past Medical History:  Diagnosis Date  . Anxiety and depression   . Arthritis   . Asthma     . Bronchitis   . GERD (gastroesophageal reflux disease)   . History of atrial fibrillation    During alcohol binge  . Hypertension     Past Surgical History:  Procedure Laterality Date  . APPENDECTOMY       Home Medications:  Prior to Admission medications   Medication Sig Start Date End Date Taking? Authorizing Provider  albuterol (PROVENTIL) (2.5 MG/3ML) 0.083% nebulizer solution Take 3 mLs (2.5 mg total) by nebulization every 4 (four) hours as needed for wheezing or shortness of breath. 09/28/17  Yes Zadie RhineWickline, Donald, MD  ALPRAZolam Prudy Feeler(XANAX) 0.5 MG tablet Take 0.5 mg by mouth 3 (three) times daily as needed for anxiety.  11/07/16  Yes [provider]  BREO ELLIPTA 100-25 MCG/INH AEPB Take 1 puff by mouth daily. 07/26/18  Yes [provider]  CARAFATE 1 GM/10ML suspension Take 10 mLs by mouth 4 (four) times daily -  with meals and at bedtime. 08/05/18  Yes [provider]  Clobetasol Propionate (TEMOVATE) 0.05 % external spray Apply 1 application topically 2 (two) times daily as needed. 08/12/18  Yes [provider]  COSENTYX SENSOREADY, 300 MG, 150 MG/ML SOAJ Inject 150 mg into the skin every 30 (thirty) days. 07/29/18  Yes [provider]  diltiazem (CARDIZEM CD) 240 MG 24 hr capsule Take 240 mg by mouth every evening.  05/12/16  Yes [provider]  fluticasone (FLONASE) 50 MCG/ACT nasal spray Place 2 sprays into both nostrils daily. 08/10/18  Yes [provider]  HYDROcodone-acetaminophen (NORCO) 10-325 MG tablet Take 1 tablet by mouth 3 (three) times daily.  11/07/16  Yes [provider]  lamoTRIgine (LAMICTAL) 25 MG tablet Take 1 tablet (25 mg total) by mouth daily. Take one tablet daily for a week and then start taking 2 tablets. Patient taking differently: Take 50 mg by mouth daily.  11/24/16  Yes [name], MD  methocarbamol (ROBAXIN) 500 MG tablet Take 1 tablet (500 mg total) by mouth 2 (two) times daily.  08/14/18  Yes [name], [name], PA-C  Multiple Vitamins-Minerals (MULTIVITAMIN MEN PO) Take 1 tablet by mouth daily.   Yes [provider]  nitroGLYCERIN (NITROSTAT) 0.4 MG SL tablet Place 0.4 mg under the tongue every 5 (five) minutes x 3 doses as needed. If no improvement, call 911 08/08/18  Yes [provider]  ondansetron (ZOFRAN-ODT) 8 MG disintegrating tablet Take 1 tablet (8 mg total) by mouth every 8 (eight) hours as needed for nausea or vomiting. 08/14/18  Yes [name], [name], PA-C  traZODone (DESYREL) 150 MG tablet Take 150 mg by mouth at bedtime.   Yes [provider]  valsartan-hydrochlorothiazide (DIOVAN-HCT) 320-25 MG tablet Take 1 tablet by mouth daily. 06/10/18  Yes [provider]    Outpatient Medications: No current facility-administered medications on file prior to encounter.    Current Outpatient Medications on File Prior to Encounter  Medication Sig Dispense Refill  . albuterol (PROVENTIL) (2.5 MG/3ML) 0.083% nebulizer solution Take 3 mLs (2.5 mg total) by nebulization every 4 (four) hours as needed for wheezing or shortness of breath. 75 mL 12  . ALPRAZolam (XANAX) 0.5 MG tablet Take 0.5 mg by mouth 3 (three) times daily as needed for anxiety.     Marland Kitchen BREO ELLIPTA 100-25 MCG/INH AEPB Take 1 puff by mouth daily.  1  . CARAFATE 1 GM/10ML suspension Take 10 mLs by mouth 4 (four) times daily -  with meals and at bedtime.  5  . Clobetasol Propionate (TEMOVATE) 0.05 % external spray Apply 1 application topically 2 (two) times daily as needed.    . COSENTYX SENSOREADY, 300 MG, 150 MG/ML SOAJ Inject 150 mg into the skin every 30 (thirty) days.    Marland Kitchen diltiazem (CARDIZEM CD) 240 MG 24 hr capsule Take 240 mg by mouth every evening.     . fluticasone (FLONASE) 50 MCG/ACT nasal spray Place 2 sprays into both nostrils daily.  3  . HYDROcodone-acetaminophen (NORCO) 10-325 MG tablet Take 1 tablet by mouth 3 (three) times daily.     Marland Kitchen lamoTRIgine (LAMICTAL) 25 MG  tablet Take 1 tablet (25 mg total) by mouth daily. Take one tablet daily for a week and then start taking 2 tablets. (Patient taking differently: Take 50 mg by mouth daily. ) 60 tablet 0  . methocarbamol (ROBAXIN) 500 MG tablet Take 1 tablet (500 mg total) by mouth 2 (two) times daily. 20 tablet 0  . Multiple Vitamins-Minerals (MULTIVITAMIN MEN PO) Take 1 tablet by mouth daily.    . nitroGLYCERIN (NITROSTAT) 0.4 MG SL tablet Place 0.4 mg under the tongue every 5 (five) minutes x 3 doses as needed. If no improvement, call 911  0  . ondansetron (ZOFRAN-ODT) 8 MG disintegrating tablet Take 1 tablet (8 mg total) by mouth every 8 (eight) hours as needed for nausea or vomiting. 20 tablet 0  . traZODone (DESYREL) 150 MG tablet Take 150 mg by mouth at bedtime.    . valsartan-hydrochlorothiazide (DIOVAN-HCT) 320-25 MG tablet Take 1 tablet  by mouth daily.      PRN Meds: morphine injection  Allergies:    Allergies  Allergen Reactions  . Desvenlafaxine Anaphylaxis    High BP    Social History:   Social History   Socioeconomic History  . Marital status: Married    Spouse name: Not on file  . Number of children: Not on file  . Years of education: Not on file  . Highest education level: Not on file  Occupational History  . Not on file  Social Needs  . Financial resource strain: Not on file  . Food insecurity:    Worry: Not on file    Inability: Not on file  . Transportation needs:    Medical: Not on file    Non-medical: Not on file  Tobacco Use  . Smoking status: Current Every Day Smoker    Packs/day: 1.00    Years: 25.00    Pack years: 25.00    Types: Cigarettes  . Smokeless tobacco: Never Used  Substance and Sexual Activity  . Alcohol use: Yes    Alcohol/week: 20.0 - 24.0 standard drinks    Types: 20 - 24 Cans of beer per week  . Drug use: No  . Sexual activity: Yes    Partners: Female  Lifestyle  . Physical activity:    Days per week: Not on file    Minutes per session: Not  on file  . Stress: Not on file  Relationships  . Social connections:    Talks on phone: Not on file    Gets together: Not on file    Attends religious service: Not on file    Active member of club or organization: Not on file    Attends meetings of clubs or organizations: Not on file    Relationship status: Not on file  . Intimate partner violence:    Fear of current or ex partner: Not on file    Emotionally abused: Not on file    Physically abused: Not on file    Forced sexual activity: Not on file  Other Topics Concern  . Not on file  Social History Narrative  . Not on file    Family History:   Family History  Problem Relation Age of Onset  . Hypertension Father      ROS:  Please see the history of present illness.  Review of Systems  Constitution: Negative.  HENT: Negative.   Cardiovascular: Positive for chest pain, dyspnea on exertion, leg swelling and orthopnea.  Respiratory: Negative.   Endocrine: Negative.   Hematologic/Lymphatic: Negative.   Musculoskeletal: Negative.   Gastrointestinal: Positive for heartburn, hemorrhoids and vomiting.  Genitourinary: Negative.   Neurological: Negative.     All other ROS reviewed and negative.     Physical Exam/Data:   Vitals:   10/14/18 0430 10/14/18 0730 10/14/18 0800 10/14/18 0830  BP: 126/66 (!) 146/60 (!) 144/66 (!) 143/81  Pulse: 86 83 64 85  Resp: (!) 25 16 18 16   Temp:      TempSrc:      SpO2: 94% 94% 92% 94%  Weight:      Height:       No intake or output data in the 24 hours ending 10/14/18 1053 Last 3 Weights 10/14/2018 10/13/2018 09/28/2017  Weight (lbs) 403 lb 9 oz 290 lb 370 lb  Weight (kg) 183.055 kg 131.543 kg 167.831 kg  Some encounter information is confidential and restricted. Go to Review Flowsheets activity to see all data.  Body mass index is 50.44 kg/m.  General: Obese, in no acute distress  HEENT: normal Lymph: no adenopathy Neck: no JVD Endocrine:  No thryomegaly Vascular: No  carotid bruits; FA pulses 2+ bilaterally without bruits  Cardiac:  normal S1, S2; RRR; no murmur distant heart sounds Lungs:  clear to auscultation bilaterally, no wheezing, rhonchi or rales  Abd: soft, nontender, no hepatomegaly  Ext: no edema Musculoskeletal:  No deformities, BUE and BLE strength normal and equal Skin: warm and dry  Neuro:  CNs 2-12 intact, no focal abnormalities noted Psych:  Normal affect   EKG:  The EKG was personally reviewed and demonstrates: Normal sinus rhythm no acute change from prior EKGs  Telemetry:  Telemetry was personally reviewed and demonstrates: Normal sinus rhythm  Relevant CV Studies: Normal nuclear stress test 2016  Laboratory Data:  Chemistry Recent Labs  Lab 10/13/18 2336  NA 131*  K 3.4*  CL 97*  CO2 23  GLUCOSE 197*  BUN 15  CREATININE 0.88  CALCIUM 8.4*  GFRNONAA >60  GFRAA >60  ANIONGAP 11    Recent Labs  Lab 10/13/18 2336  PROT 7.3  ALBUMIN 3.9  AST 23  ALT 43  ALKPHOS 32*  BILITOT 0.3   Hematology Recent Labs  Lab 10/13/18 2336  WBC 6.6  RBC 4.95  HGB 15.2  HCT 46.4  MCV 93.7  MCH 30.7  MCHC 32.8  RDW 12.9  PLT 196   Cardiac Enzymes Recent Labs  Lab 10/13/18 2336 10/14/18 0246  TROPONINI <0.03 0.03*    Recent Labs  Lab 10/13/18 2345  TROPIPOC 0.00    BNPNo results for input(s): BNP, PROBNP in the last 168 hours.  DDimer  Recent Labs  Lab 10/13/18 2236  DDIMER 0.31    Radiology/Studies:  Dg Chest 2 View  Result Date: 10/14/2018 CLINICAL DATA:  Chest pain EXAM: CHEST - 2 VIEW COMPARISON:  08/14/2018 FINDINGS: Mild bibasilar atelectasis. No focal consolidation. No pleural effusion or pneumothorax. The heart is normal in size. Visualized osseous structures are within normal limits. IMPRESSION: Normal chest radiographs. Electronically Signed   By: Charline Bills M.D.   On: 10/14/2018 00:11   Ct Abdomen Pelvis W Contrast  Result Date: 10/14/2018 CLINICAL DATA:  Right upper quadrant chest  pain, prior appendectomy EXAM: CT ABDOMEN AND PELVIS WITH CONTRAST TECHNIQUE: Multidetector CT imaging of the abdomen and pelvis was performed using the standard protocol following bolus administration of intravenous contrast. CONTRAST:  ISOVUE-300 IOPAMIDOL (ISOVUE-300) INJECTION 61% COMPARISON:  08/14/2018 FINDINGS: Lower chest: Lung bases are clear. Hepatobiliary: Moderate to severe geographic hepatic steatosis with focal fatty sparing along the gallbladder fossa. Gallbladder is underdistended unremarkable. No intrahepatic or extrahepatic ductal dilatation. Pancreas: Within normal limits. Spleen: Within normal limits. Adrenals/Urinary Tract: Adrenal glands are within normal limits. Kidneys are within normal limits.  No hydronephrosis. Bladder is within normal limits. Stomach/Bowel: Stomach is notable for a tiny hiatal hernia. No evidence of bowel obstruction. Prior appendectomy. Vascular/Lymphatic: No evidence of abdominal aortic aneurysm. No suspicious abdominopelvic lymphadenopathy. Reproductive: Prostate is unremarkable. Other: No abdominopelvic ascites. Musculoskeletal: Visualized osseous structures are within normal limits. IMPRESSION: Gallbladder is unremarkable on CT. Moderate to severe hepatic steatosis with focal fatty sparing. No evidence of bowel obstruction.  Prior appendectomy. No CT findings to account for the patient's right abdominal pain. Electronically Signed   By: Charline Bills M.D.   On: 10/14/2018 02:30    Assessment and Plan:   Chest pain and dyspnea on exertion with multiple CV  risk factors.  Normal NST 2016 at [facility].  Patient's symptoms have progressively worsened and do respond to nitroglycerin.  Only definitive diagnosis at this point would be cardiac catheterization given his size and symptoms. I have reviewed the risks, indications, and alternatives to angioplasty and stenting with the patient. Risks include but are not limited to bleeding, infection, vascular injury,  stroke, myocardial infection, arrhythmia, kidney injury, radiation-related injury in the case of prolonged fluoroscopy use, emergency cardiac surgery, and death. The patient understands the risks of serious complication is low (<1%) and patient agrees to proceed. Hospitalist team will transfer and plan cath tomorrow.  Essential hypertension Morbid obesity BMI greater than 50 Diabetes mellitus Hyperlipidemia Tobacco abuse greater than 1 pack/day smoker EtOH drinks a sixpack daily PAF x1 in 2011 in the setting of alcohol binge Anxiety GERD and lots of GI symptoms worse on Protonix Family history of early CAD  For questions or updates, please contact [facility] Please consult [email or web address] for contact info under   Signed, Jacolyn Reedy PA-C 10/14/2018 10:53 AM   Attending note:  Patient seen and examined.  I reviewed extensive records and updated the chart, also discussed the case with Ms. Geni Bers PA-C.  Scott Waller is a morbidly obese 38 year old male (403 pounds) with a history of tobacco abuse, regular alcohol use with previous single episode of atrial fibrillation in the setting of binge, anxiety, reflux, hypertension, and family history of premature CAD.  He presents to the ER with described worsening dyspnea on exertion, currently NYHA class III based on description, recurring right upper chest and arm discomfort with activity, also worsening reflux symptoms however and also intermittent abdominal pain.  He uses nitroglycerin with these symptoms, typically with improvement, required 4 tablets most recently.  He has undergone previous cardiac evaluation through the [facility] system reportedly with a normal stress test in 2016, no interval testing apparent.  On evaluation in the emergency room he reports mild residual right upper extremity discomfort.  No shortness of breath at rest.  Blood pressure 150 systolic with heart rate in the 70s to 80s in sinus rhythm by telemetry.  He is morbidly  obese.  Lungs exhibit decreased breath sounds but no wheezing.  Cardiac exam reveals RRR without gallop.  Lab work shows potassium 3.4, BUN 15, creatinine 0.88, normal LFTs, troponin I less than 0.03 up to 0.03, hemoglobin 15.2, platelets 196.  D-dimer negative.  Abdominal CT showed normal-appearing gallbladder, moderate to severe hepatic steatosis.  Chest x-ray unremarkable.  I personally reviewed his ECG which shows a sinus rhythm with small R' in lead V1 and V2.  Patient presents with progressive dyspnea on exertion and right upper chest as well as arm discomfort, typical and atypical features with GI overlay as well.  Cardiac risk factors include morbid obesity, family history of premature CAD, elevated glucose suggestive of diabetes mellitus, hypertension, and ongoing tobacco abuse.  Body habitus makes noninvasive cardiac imaging challenging in terms of definitive assessment.  We discussed proceeding to a diagnostic cardiac catheterization to most clearly evaluate coronary anatomy.  After reviewing the risks and benefits, he is in agreement to proceed.  We have contacted our cardiology service at [facility] [place], patient will be scheduled for tomorrow.  In the interim he will be admitted to the hospitalist service and cardiology will follow him in consultation.  Jonelle Sidle, M.D., F.A.C.C.

## 2018-10-14 NOTE — H&P (Addendum)
TRH H&P    Patient Demographics:    Scott Waller, is a 38 y.o. male  MRN: [id number]  DOB - Oct 19, 1980  Admit Date - 10/13/2018  Referring MD/NP/PA: Dr. Lajean Saver  Outpatient Primary MD for the patient is Etta Quill, PA-C  Patient coming from: Home  Chief complaint-chest pain   HPI:    Scott Waller  is a 38 y.o. male, with history of obesity, hypertension, psoriatic arthritis came to the hospital with right-sided chest pain with radiation to upper arm and back.  Patient says that he has had these pains intermittently and it has been attributed to esophageal spasm.  Usually takes nitroglycerin at home and gets relief after taking 1 tablet.  Today he took 5 of these tablets and still was having pain so he came to ED for further evaluation.  Patient also noticed to have chest pain on exertion.  Patient does not take Protonix because it makes the pain worse.  Patient had negative cardiac stress test almost 1-1/2-year ago. He denies shortness of breath. Denies nausea vomiting or diarrhea. In the ED troponin was 0.03. No previous history of stroke or seizures. No history of CAD Denies abdominal pain or dysuria.   Review of systems:    In addition to the HPI above,    All other systems reviewed and are negative.    Past History of the following :    Past Medical History:  Diagnosis Date  . A-fib (HCC)   . Arthritis   . Asthma   . Bronchitis   . Depression   . Hypertension       Past Surgical History:  Procedure Laterality Date  . APPENDECTOMY        Social History:      Social History   Tobacco Use  . Smoking status: Current Every Day Smoker    Packs/day: 1.00    Years: 25.00    Pack years: 25.00    Types: Cigarettes  . Smokeless tobacco: Never Used  Substance Use Topics  . Alcohol use: Yes    Alcohol/week: 20.0 - 24.0 standard drinks    Types: 20 - 24 Cans of beer per week       Family History :   Patient grandfather had CAD   Home Medications:   Prior to Admission medications   Medication Sig Start Date End Date Taking? Authorizing Provider  albuterol (PROVENTIL) (2.5 MG/3ML) 0.083% nebulizer solution Take 3 mLs (2.5 mg total) by nebulization every 4 (four) hours as needed for wheezing or shortness of breath. 09/28/17   [name], MD  ALPRAZolam Prudy Feeler) 0.5 MG tablet Take 0.5 mg by mouth 3 (three) times daily as needed for anxiety.  11/07/16   [provider]  BREO ELLIPTA 100-25 MCG/INH AEPB Take 1 puff by mouth daily. 07/26/18   [provider]  CARAFATE 1 GM/10ML suspension Take 10 mLs by mouth 4 (four) times daily -  with meals and at bedtime. 08/05/18   [provider]  Clobetasol Propionate (TEMOVATE) 0.05 % external spray Apply  1 application topically 2 (two) times daily as needed. 08/12/18   [provider]  COSENTYX SENSOREADY, 300 MG, 150 MG/ML SOAJ Inject 150 mg into the skin every 30 (thirty) days. 07/29/18   [provider]  dicyclomine (BENTYL) 20 MG tablet Take 1 tablet (20 mg total) by mouth 2 (two) times daily. 08/14/18   [name], [name], PA-C  diltiazem (CARDIZEM CD) 240 MG 24 hr capsule Take 240 mg by mouth every evening.  05/12/16   [provider]  fluticasone (FLONASE) 50 MCG/ACT nasal spray Place 2 sprays into both nostrils daily. 08/10/18   [provider]  HYDROcodone-acetaminophen (NORCO) 10-325 MG tablet Take 1 tablet by mouth 3 (three) times daily.  11/07/16   [provider]  lamoTRIgine (LAMICTAL) 25 MG tablet Take 1 tablet (25 mg total) by mouth daily. Take one tablet daily for a week and then start taking 2 tablets. Patient taking differently: Take 50 mg by mouth daily.  11/24/16   [name], MD  methocarbamol (ROBAXIN) 500 MG tablet Take 1 tablet (500 mg total) by mouth 2 (two) times daily. 08/14/18   [name], [name], PA-C  Multiple Vitamins-Minerals  (MULTIVITAMIN MEN PO) Take 1 tablet by mouth daily.    [provider]  nitroGLYCERIN (NITROSTAT) 0.4 MG SL tablet Place 0.4 mg under the tongue every 5 (five) minutes x 3 doses as needed. If no improvement, call 911 08/08/18   [provider]  ondansetron (ZOFRAN-ODT) 8 MG disintegrating tablet Take 8 mg by mouth 3 (three) times daily as needed for nausea or vomiting.  08/05/18   [provider]  ondansetron (ZOFRAN-ODT) 8 MG disintegrating tablet Take 1 tablet (8 mg total) by mouth every 8 (eight) hours as needed for nausea or vomiting. 08/14/18   [name], [name], PA-C  pantoprazole (PROTONIX) 40 MG tablet Take 40 mg by mouth 2 (two) times daily. 08/08/18   [provider]  predniSONE (DELTASONE) 50 MG tablet One tablet PO daily for 4 days Patient not taking: Reported on 08/14/2018 09/28/17   [name], MD  traZODone (DESYREL) 150 MG tablet Take 150 mg by mouth at bedtime.    [provider]  valsartan-hydrochlorothiazide (DIOVAN-HCT) 320-25 MG tablet Take 1 tablet by mouth daily. 06/10/18   [provider]     Allergies:     Allergies  Allergen Reactions  . Desvenlafaxine Anaphylaxis    High BP     Physical Exam:   Vitals  Blood pressure (!) 142/67, pulse 89, temperature 97.9 F (36.6 C), temperature source Oral, resp. rate 13, height 6\' 3"  (1.905 m), weight (!) 183.1 kg, SpO2 91 %.  1.  General: Appears in no acute distress  2. Psychiatric:  Intact judgement and  insight, awake alert, oriented x 3.  3. Neurologic: No focal neurological deficits, all cranial nerves intact.Strength 5/5 all 4 extremities, sensation intact all 4 extremities, plantars down going.  4. Eyes :  anicteric sclerae, moist conjunctivae with no lid lag. PERRLA.  5. ENMT:  Oropharynx clear with moist mucous membranes and good dentition  6. Neck:  supple, no cervical lymphadenopathy appriciated, No thyromegaly  7. Respiratory : Normal respiratory  effort, good air movement bilaterally,clear to  auscultation bilaterally  8. Cardiovascular : RRR, no gallops, rubs or murmurs, no leg edema  9. Gastrointestinal:  Positive bowel sounds, abdomen soft, non-tender to palpation,no hepatosplenomegaly, no rigidity or guarding       10. Skin:  No cyanosis, normal texture and turgor,  no rash, lesions or ulcers  11.Musculoskeletal:  Good muscle tone,  joints appear normal , no effusions,  normal range of motion    Data Review:    CBC Recent Labs  Lab 10/13/18 2336  WBC 6.6  HGB 15.2  HCT 46.4  PLT 196  MCV 93.7  MCH 30.7  MCHC 32.8  RDW 12.9   ------------------------------------------------------------------------------------------------------------------  Results for orders placed or performed during the hospital encounter of 10/13/18 (from the past 48 hour(s))  D-dimer, quantitative (not at [facility])     Status: None   Collection Time: 10/13/18 10:36 PM  Result Value Ref Range   D-Dimer, Quant 0.31 0.00 - 0.50 ug/mL-FEU    Comment: (NOTE) At the manufacturer cut-off of 0.50 ug/mL FEU, this assay has been documented to exclude PE with a sensitivity and negative predictive value of 97 to 99%.  At this time, this assay has not been approved by the FDA to exclude DVT/VTE. Results should be correlated with clinical presentation. Performed at [facility], [address], [place], [place] [address]   Basic metabolic panel     Status: Abnormal   Collection Time: 10/13/18 11:36 PM  Result Value Ref Range   Sodium 131 (L) 135 - 145 mmol/L   Potassium 3.4 (L) 3.5 - 5.1 mmol/L   Chloride 97 (L) 98 - 111 mmol/L   CO2 23 22 - 32 mmol/L   Glucose, Bld 197 (H) 70 - 99 mg/dL   BUN 15 6 - 20 mg/dL   Creatinine, Ser 5.78 0.61 - 1.24 mg/dL   Calcium 8.4 (L) 8.9 - 10.3 mg/dL   GFR calc non Af Amer >60 >60 mL/min   GFR calc Af Amer >60 >60 mL/min   Anion gap 11 5 - 15    Comment: Performed at [facility], [address], [place],  [place] [address]  CBC     Status: None   Collection Time: 10/13/18 11:36 PM  Result Value Ref Range   WBC 6.6 4.0 - 10.5 K/uL   RBC 4.95 4.22 - 5.81 MIL/uL   Hemoglobin 15.2 13.0 - 17.0 g/dL   HCT 95.2 84.1 - 32.4 %   MCV 93.7 80.0 - 100.0 fL   MCH 30.7 26.0 - 34.0 pg   MCHC 32.8 30.0 - 36.0 g/dL   RDW 40.1 02.7 - 25.3 %   Platelets 196 150 - 400 K/uL   nRBC 0.0 0.0 - 0.2 %    Comment: Performed at [facility], [address] [place], [place] [address]  Lipase, blood     Status: None   Collection Time: 10/13/18 11:36 PM  Result Value Ref Range   Lipase 31 11 - 51 U/L    Comment: Performed at [facility], [address], [place], [place] [address]  Troponin I - ONCE - STAT     Status: None   Collection Time: 10/13/18 11:36 PM  Result Value Ref Range   Troponin I <0.03 <0.03 ng/mL    Comment: Performed at [facility], [address] [place], [place] [address]  Hepatic function panel     Status: Abnormal   Collection Time: 10/13/18 11:36 PM  Result Value Ref Range   Total Protein 7.3 6.5 - 8.1 g/dL   Albumin 3.9 3.5 - 5.0 g/dL   AST 23 15 - 41 U/L   ALT 43 0 - 44 U/L   Alkaline Phosphatase 32 (L) 38 - 126 U/L   Total Bilirubin 0.3 0.3 - 1.2 mg/dL  Bilirubin, Direct <0.1 0.0 - 0.2 mg/dL   Indirect Bilirubin NOT CALCULATED 0.3 - 0.9 mg/dL    Comment: Performed at [facility], [address] [place], [place] [address]  I-stat troponin, ED     Status: None   Collection Time: 10/13/18 11:45 PM  Result Value Ref Range   Troponin i, poc 0.00 0.00 - 0.08 ng/mL   Comment 3            Comment: Due to the release kinetics of cTnI, a negative result within the first hours of the onset of symptoms does not rule out myocardial infarction with certainty. If myocardial infarction is still suspected, repeat the test at appropriate intervals.   Troponin I - ONCE - STAT     Status: Abnormal   Collection Time: 10/14/18  2:46 AM  Result Value Ref Range   Troponin I 0.03 (HH) <0.03 ng/mL     Comment: CRITICAL RESULT CALLED TO, READ BACK BY AND VERIFIED WITH: CRAWFORD,B @ 0343 ON 10/14/18 BY JUW Performed at [facility], [address], [place], [place] [address]     Chemistries  Recent Labs  Lab 10/13/18 2336  NA 131*  K 3.4*  CL 97*  CO2 23  GLUCOSE 197*  BUN 15  CREATININE 0.88  CALCIUM 8.4*  AST 23  ALT 43  ALKPHOS 32*  BILITOT 0.3   ------------------------------------------------------------------------------------------------------------------  ------------------------------------------------------------------------------------------------------------------ GFR: Estimated Creatinine Clearance: 197.5 mL/min (by C-G formula based on SCr of 0.88 mg/dL). Liver Function Tests: Recent Labs  Lab 10/13/18 2336  AST 23  ALT 43  ALKPHOS 32*  BILITOT 0.3  PROT 7.3  ALBUMIN 3.9   Recent Labs  Lab 10/13/18 2336  LIPASE 31   No results for input(s): AMMONIA in the last 168 hours. Coagulation Profile: No results for input(s): INR, PROTIME in the last 168 hours. Cardiac Enzymes: Recent Labs  Lab 10/13/18 2336 10/14/18 0246  TROPONINI <0.03 0.03*   BNP (last 3 results) No results for input(s): PROBNP in the last 8760 hours. HbA1C: No results for input(s): HGBA1C in the last 72 hours. CBG: No results for input(s): GLUCAP in the last 168 hours. Lipid Profile: No results for input(s): CHOL, HDL, LDLCALC, TRIG, CHOLHDL, LDLDIRECT in the last 72 hours. Thyroid Function Tests: No results for input(s): TSH, T4TOTAL, FREET4, T3FREE, THYROIDAB in the last 72 hours. Anemia Panel: No results for input(s): VITAMINB12, FOLATE, FERRITIN, TIBC, IRON, RETICCTPCT in the last 72 hours.  --------------------------------------------------------------------------------------------------------------- Urine analysis:    Component Value Date/Time   COLORURINE STRAW (A) 08/14/2018 1516   APPEARANCEUR CLEAR 08/14/2018 1516   LABSPEC 1.004 (L) 08/14/2018 1516   PHURINE  7.0 08/14/2018 1516   GLUCOSEU NEGATIVE 08/14/2018 1516   HGBUR NEGATIVE 08/14/2018 1516   BILIRUBINUR NEGATIVE 08/14/2018 1516   KETONESUR NEGATIVE 08/14/2018 1516   PROTEINUR NEGATIVE 08/14/2018 1516   NITRITE NEGATIVE 08/14/2018 1516   LEUKOCYTESUR NEGATIVE 08/14/2018 1516      Imaging Results:    Dg Chest 2 View  Result Date: 10/14/2018 CLINICAL DATA:  Chest pain EXAM: CHEST - 2 VIEW COMPARISON:  08/14/2018 FINDINGS: Mild bibasilar atelectasis. No focal consolidation. No pleural effusion or pneumothorax. The heart is normal in size. Visualized osseous structures are within normal limits. IMPRESSION: Normal chest radiographs. Electronically Signed   By: Charline Bills M.D.   On: 10/14/2018 00:11   Ct Abdomen Pelvis W Contrast  Result Date: 10/14/2018 CLINICAL DATA:  Right upper quadrant chest pain, prior appendectomy EXAM: CT ABDOMEN AND PELVIS  WITH CONTRAST TECHNIQUE: Multidetector CT imaging of the abdomen and pelvis was performed using the standard protocol following bolus administration of intravenous contrast. CONTRAST:  100mL ISOVUE-300 IOPAMIDOL (ISOVUE-300) INJECTION 61% COMPARISON:  08/14/2018 FINDINGS: Lower chest: Lung bases are clear. Hepatobiliary: Moderate to severe geographic hepatic steatosis with focal fatty sparing along the gallbladder fossa. Gallbladder is underdistended unremarkable. No intrahepatic or extrahepatic ductal dilatation. Pancreas: Within normal limits. Spleen: Within normal limits. Adrenals/Urinary Tract: Adrenal glands are within normal limits. Kidneys are within normal limits.  No hydronephrosis. Bladder is within normal limits. Stomach/Bowel: Stomach is notable for a tiny hiatal hernia. No evidence of bowel obstruction. Prior appendectomy. Vascular/Lymphatic: No evidence of abdominal aortic aneurysm. No suspicious abdominopelvic lymphadenopathy. Reproductive: Prostate is unremarkable. Other: No abdominopelvic ascites. Musculoskeletal: Visualized osseous  structures are within normal limits. IMPRESSION: Gallbladder is unremarkable on CT. Moderate to severe hepatic steatosis with focal fatty sparing. No evidence of bowel obstruction.  Prior appendectomy. No CT findings to account for the patient's right abdominal pain. Electronically Signed   By: Charline BillsSriyesh  Krishnan M.D.   On: 10/14/2018 02:30    My personal review of EKG: Rhythm NSR, nonspecific ST changes.   Assessment & Plan:    Active Problems:   Chest pain   1. Chest pain-placed under observation, will obtain serial troponin every 6 hours x3.  Consult cardiology in a.m. for possible stress test.  We will keep patient n.p.o.  Patient received aspirin in the ED.  2. GERD-patient does not take Protonix as the pain become worse with Protonix as per patient.  Will start with Pepcid 20 mg p.o. daily.  Continue Carafate  3. Hypertension-blood pressure stable, continue home medications including valsartan/HCTZ, Cardizem.    4. Alcohol abuse-patient drinks 6 packs of beer every day, no signs and symptoms of alcohol withdrawal . Will start CIWA protocol.   DVT Prophylaxis-   Lovenox   AM Labs Ordered, also please review Full Orders  Family Communication: Admission, patients condition and plan of care including tests being ordered have been discussed with the patient  who indicate understanding and agree with the plan and Code Status.  Code Status: Full code  Admission status: Observation: Based on patients clinical presentation and evaluation of above clinical data, patient will need less than 2 midnight stay in the hospital.  Placed under observation for chest pain, rule out ACS.  Time spent in minutes : 60 minutes   Meredeth IdeGagan S Jahmya Onofrio M.D on 10/14/2018 at 5:02 AM  Between 7am to 7pm - Pager - [phone]. After 7pm go to [email or web address] - password [email or web address]   [facility] - Office  [phone]

## 2018-10-14 NOTE — H&P (View-Only) (Signed)
Cardiology Consultation:   Patient ID: Burns Ewan MRN: [id number]; DOB: 01/30/1981  Admit date: 10/13/2018 Date of Consult: 10/14/2018  Primary Care Provider: Bryan, Daniel J, PA-C Primary Cardiologist: Dr. Gary Renaldo ([facility]) Primary Electrophysiologist:  None    Patient Profile:   Scott Waller is a 38 y.o. male with a history of recurrent chest pain, prior episode of atrial fibrillation in 2011 in the setting of alcohol binge, morbid obesity, tobacco abuse, possible diabetes mellitus, and reflux who is being seen today for the evaluation of chest pain and shortness of breath at the request of [place].  History of Present Illness:   Mr. Harada is a 38-year-old male patient with history of hypertension, diabetes, obesity, family history of CAD with grandfather dying in his 50s of an MI another grandfather MI in his 60s, tobacco abuse with 20-pack-year history of smoking, EtOH drinks a sixpack daily, a lot of anxiety.  Had AF single episode in 2011 related to alcohol binge.  Negative stress Cardiolite in 2016.  He last saw Dr. Gary Renaldo, cardiologist with [facility] 03/2017.  Was to have stress test at that time although not clear that this was done based on chart review.  He has been on nitroglycerin over the last few years.  Patient complains of several week history of progressive worsening of chest pain.  He describes right-sided chest pain into his right arm and right shoulder blade with very little activity as well as dyspnea on exertion.  He also has a lot of GI symptoms especially when he lays down at night.  He says Protonix makes it a lot worse but helps his indigestion.  He does better with Zantac.  Last night after eating and laying down he developed severe chest pressure unrelieved with 4 nitroglycerin.  Usually his pain is relieved with 2 nitroglycerin.  Troponins negative, EKG unchanged.  Past Medical History:  Diagnosis Date  . Anxiety and depression   . Arthritis   . Asthma     . Bronchitis   . GERD (gastroesophageal reflux disease)   . History of atrial fibrillation    During alcohol binge  . Hypertension     Past Surgical History:  Procedure Laterality Date  . APPENDECTOMY       Home Medications:  Prior to Admission medications   Medication Sig Start Date End Date Taking? Authorizing Provider  albuterol (PROVENTIL) (2.5 MG/3ML) 0.083% nebulizer solution Take 3 mLs (2.5 mg total) by nebulization every 4 (four) hours as needed for wheezing or shortness of breath. 09/28/17  Yes Wickline, Donald, MD  ALPRAZolam (XANAX) 0.5 MG tablet Take 0.5 mg by mouth 3 (three) times daily as needed for anxiety.  11/07/16  Yes [provider]  BREO ELLIPTA 100-25 MCG/INH AEPB Take 1 puff by mouth daily. 07/26/18  Yes [provider]  CARAFATE 1 GM/10ML suspension Take 10 mLs by mouth 4 (four) times daily -  with meals and at bedtime. 08/05/18  Yes [provider]  Clobetasol Propionate (TEMOVATE) 0.05 % external spray Apply 1 application topically 2 (two) times daily as needed. 08/12/18  Yes [provider]  COSENTYX SENSOREADY, 300 MG, 150 MG/ML SOAJ Inject 150 mg into the skin every 30 (thirty) days. 07/29/18  Yes [provider]  diltiazem (CARDIZEM CD) 240 MG 24 hr capsule Take 240 mg by mouth every evening.  05/12/16  Yes [provider]  fluticasone (FLONASE) 50 MCG/ACT nasal spray Place 2 sprays into both nostrils daily. 08/10/18  Yes [provider]  HYDROcodone-acetaminophen (NORCO) 10-325 MG tablet Take 1 tablet by mouth 3 (three) times daily.  11/07/16  Yes [provider]  lamoTRIgine (LAMICTAL) 25 MG tablet Take 1 tablet (25 mg total) by mouth daily. Take one tablet daily for a week and then start taking 2 tablets. Patient taking differently: Take 50 mg by mouth daily.  11/24/16  Yes Akhtar, Nadeem, MD  methocarbamol (ROBAXIN) 500 MG tablet Take 1 tablet (500 mg total) by mouth 2 (two) times daily.  08/14/18  Yes [name], [name], PA-C  Multiple Vitamins-Minerals (MULTIVITAMIN MEN PO) Take 1 tablet by mouth daily.   Yes [provider]  nitroGLYCERIN (NITROSTAT) 0.4 MG SL tablet Place 0.4 mg under the tongue every 5 (five) minutes x 3 doses as needed. If no improvement, call 911 08/08/18  Yes [provider]  ondansetron (ZOFRAN-ODT) 8 MG disintegrating tablet Take 1 tablet (8 mg total) by mouth every 8 (eight) hours as needed for nausea or vomiting. 08/14/18  Yes [name], [name], PA-C  traZODone (DESYREL) 150 MG tablet Take 150 mg by mouth at bedtime.   Yes [provider]  valsartan-hydrochlorothiazide (DIOVAN-HCT) 320-25 MG tablet Take 1 tablet by mouth daily. 06/10/18  Yes [provider]    Outpatient Medications: No current facility-administered medications on file prior to encounter.    Current Outpatient Medications on File Prior to Encounter  Medication Sig Dispense Refill  . albuterol (PROVENTIL) (2.5 MG/3ML) 0.083% nebulizer solution Take 3 mLs (2.5 mg total) by nebulization every 4 (four) hours as needed for wheezing or shortness of breath. 75 mL 12  . ALPRAZolam (XANAX) 0.5 MG tablet Take 0.5 mg by mouth 3 (three) times daily as needed for anxiety.     . BREO ELLIPTA 100-25 MCG/INH AEPB Take 1 puff by mouth daily.  1  . CARAFATE 1 GM/10ML suspension Take 10 mLs by mouth 4 (four) times daily -  with meals and at bedtime.  5  . Clobetasol Propionate (TEMOVATE) 0.05 % external spray Apply 1 application topically 2 (two) times daily as needed.    . COSENTYX SENSOREADY, 300 MG, 150 MG/ML SOAJ Inject 150 mg into the skin every 30 (thirty) days.    . diltiazem (CARDIZEM CD) 240 MG 24 hr capsule Take 240 mg by mouth every evening.     . fluticasone (FLONASE) 50 MCG/ACT nasal spray Place 2 sprays into both nostrils daily.  3  . HYDROcodone-acetaminophen (NORCO) 10-325 MG tablet Take 1 tablet by mouth 3 (three) times daily.     . lamoTRIgine (LAMICTAL) 25 MG  tablet Take 1 tablet (25 mg total) by mouth daily. Take one tablet daily for a week and then start taking 2 tablets. (Patient taking differently: Take 50 mg by mouth daily. ) 60 tablet 0  . methocarbamol (ROBAXIN) 500 MG tablet Take 1 tablet (500 mg total) by mouth 2 (two) times daily. 20 tablet 0  . Multiple Vitamins-Minerals (MULTIVITAMIN MEN PO) Take 1 tablet by mouth daily.    . nitroGLYCERIN (NITROSTAT) 0.4 MG SL tablet Place 0.4 mg under the tongue every 5 (five) minutes x 3 doses as needed. If no improvement, call 911  0  . ondansetron (ZOFRAN-ODT) 8 MG disintegrating tablet Take 1 tablet (8 mg total) by mouth every 8 (eight) hours as needed for nausea or vomiting. 20 tablet 0  . traZODone (DESYREL) 150 MG tablet Take 150 mg by mouth at bedtime.    . valsartan-hydrochlorothiazide (DIOVAN-HCT) 320-25 MG tablet Take 1 tablet   by mouth daily.      PRN Meds: morphine injection  Allergies:    Allergies  Allergen Reactions  . Desvenlafaxine Anaphylaxis    High BP    Social History:   Social History   Socioeconomic History  . Marital status: Married    Spouse name: Not on file  . Number of children: Not on file  . Years of education: Not on file  . Highest education level: Not on file  Occupational History  . Not on file  Social Needs  . Financial resource strain: Not on file  . Food insecurity:    Worry: Not on file    Inability: Not on file  . Transportation needs:    Medical: Not on file    Non-medical: Not on file  Tobacco Use  . Smoking status: Current Every Day Smoker    Packs/day: 1.00    Years: 25.00    Pack years: 25.00    Types: Cigarettes  . Smokeless tobacco: Never Used  Substance and Sexual Activity  . Alcohol use: Yes    Alcohol/week: 20.0 - 24.0 standard drinks    Types: 20 - 24 Cans of beer per week  . Drug use: No  . Sexual activity: Yes    Partners: Female  Lifestyle  . Physical activity:    Days per week: Not on file    Minutes per session: Not  on file  . Stress: Not on file  Relationships  . Social connections:    Talks on phone: Not on file    Gets together: Not on file    Attends religious service: Not on file    Active member of club or organization: Not on file    Attends meetings of clubs or organizations: Not on file    Relationship status: Not on file  . Intimate partner violence:    Fear of current or ex partner: Not on file    Emotionally abused: Not on file    Physically abused: Not on file    Forced sexual activity: Not on file  Other Topics Concern  . Not on file  Social History Narrative  . Not on file    Family History:   Family History  Problem Relation Age of Onset  . Hypertension Father      ROS:  Please see the history of present illness.  Review of Systems  Constitution: Negative.  HENT: Negative.   Cardiovascular: Positive for chest pain, dyspnea on exertion, leg swelling and orthopnea.  Respiratory: Negative.   Endocrine: Negative.   Hematologic/Lymphatic: Negative.   Musculoskeletal: Negative.   Gastrointestinal: Positive for heartburn, hemorrhoids and vomiting.  Genitourinary: Negative.   Neurological: Negative.     All other ROS reviewed and negative.     Physical Exam/Data:   Vitals:   10/14/18 0430 10/14/18 0730 10/14/18 0800 10/14/18 0830  BP: 126/66 (!) 146/60 (!) 144/66 (!) 143/81  Pulse: 86 83 64 85  Resp: (!) 25 16 18 16  Temp:      TempSrc:      SpO2: 94% 94% 92% 94%  Weight:      Height:       No intake or output data in the 24 hours ending 10/14/18 1053 Last 3 Weights 10/14/2018 10/13/2018 09/28/2017  Weight (lbs) 403 lb 9 oz 290 lb 370 lb  Weight (kg) 183.055 kg 131.543 kg 167.831 kg  Some encounter information is confidential and restricted. Go to Review Flowsheets activity to see all data.       Body mass index is 50.44 kg/m.  General: Obese, in no acute distress  HEENT: normal Lymph: no adenopathy Neck: no JVD Endocrine:  No thryomegaly Vascular: No  carotid bruits; FA pulses 2+ bilaterally without bruits  Cardiac:  normal S1, S2; RRR; no murmur distant heart sounds Lungs:  clear to auscultation bilaterally, no wheezing, rhonchi or rales  Abd: soft, nontender, no hepatomegaly  Ext: no edema Musculoskeletal:  No deformities, BUE and BLE strength normal and equal Skin: warm and dry  Neuro:  CNs 2-12 intact, no focal abnormalities noted Psych:  Normal affect   EKG:  The EKG was personally reviewed and demonstrates: Normal sinus rhythm no acute change from prior EKGs  Telemetry:  Telemetry was personally reviewed and demonstrates: Normal sinus rhythm  Relevant CV Studies: Normal nuclear stress test 2016  Laboratory Data:  Chemistry Recent Labs  Lab 10/13/18 2336  NA 131*  K 3.4*  CL 97*  CO2 23  GLUCOSE 197*  BUN 15  CREATININE 0.88  CALCIUM 8.4*  GFRNONAA >60  GFRAA >60  ANIONGAP 11    Recent Labs  Lab 10/13/18 2336  PROT 7.3  ALBUMIN 3.9  AST 23  ALT 43  ALKPHOS 32*  BILITOT 0.3   Hematology Recent Labs  Lab 10/13/18 2336  WBC 6.6  RBC 4.95  HGB 15.2  HCT 46.4  MCV 93.7  MCH 30.7  MCHC 32.8  RDW 12.9  PLT 196   Cardiac Enzymes Recent Labs  Lab 10/13/18 2336 10/14/18 0246  TROPONINI <0.03 0.03*    Recent Labs  Lab 10/13/18 2345  TROPIPOC 0.00    BNPNo results for input(s): BNP, PROBNP in the last 168 hours.  DDimer  Recent Labs  Lab 10/13/18 2236  DDIMER 0.31    Radiology/Studies:  Dg Chest 2 View  Result Date: 10/14/2018 CLINICAL DATA:  Chest pain EXAM: CHEST - 2 VIEW COMPARISON:  08/14/2018 FINDINGS: Mild bibasilar atelectasis. No focal consolidation. No pleural effusion or pneumothorax. The heart is normal in size. Visualized osseous structures are within normal limits. IMPRESSION: Normal chest radiographs. Electronically Signed   By: Sriyesh  Krishnan M.D.   On: 10/14/2018 00:11   Ct Abdomen Pelvis W Contrast  Result Date: 10/14/2018 CLINICAL DATA:  Right upper quadrant chest  pain, prior appendectomy EXAM: CT ABDOMEN AND PELVIS WITH CONTRAST TECHNIQUE: Multidetector CT imaging of the abdomen and pelvis was performed using the standard protocol following bolus administration of intravenous contrast. CONTRAST:  100mL ISOVUE-300 IOPAMIDOL (ISOVUE-300) INJECTION 61% COMPARISON:  08/14/2018 FINDINGS: Lower chest: Lung bases are clear. Hepatobiliary: Moderate to severe geographic hepatic steatosis with focal fatty sparing along the gallbladder fossa. Gallbladder is underdistended unremarkable. No intrahepatic or extrahepatic ductal dilatation. Pancreas: Within normal limits. Spleen: Within normal limits. Adrenals/Urinary Tract: Adrenal glands are within normal limits. Kidneys are within normal limits.  No hydronephrosis. Bladder is within normal limits. Stomach/Bowel: Stomach is notable for a tiny hiatal hernia. No evidence of bowel obstruction. Prior appendectomy. Vascular/Lymphatic: No evidence of abdominal aortic aneurysm. No suspicious abdominopelvic lymphadenopathy. Reproductive: Prostate is unremarkable. Other: No abdominopelvic ascites. Musculoskeletal: Visualized osseous structures are within normal limits. IMPRESSION: Gallbladder is unremarkable on CT. Moderate to severe hepatic steatosis with focal fatty sparing. No evidence of bowel obstruction.  Prior appendectomy. No CT findings to account for the patient's right abdominal pain. Electronically Signed   By: Sriyesh  Krishnan M.D.   On: 10/14/2018 02:30    Assessment and Plan:   Chest pain and dyspnea on exertion with multiple CV   risk factors.  Normal NST 2016 at [facility].  Patient's symptoms have progressively worsened and do respond to nitroglycerin.  Only definitive diagnosis at this point would be cardiac catheterization given his size and symptoms. I have reviewed the risks, indications, and alternatives to angioplasty and stenting with the patient. Risks include but are not limited to bleeding, infection, vascular injury,  stroke, myocardial infection, arrhythmia, kidney injury, radiation-related injury in the case of prolonged fluoroscopy use, emergency cardiac surgery, and death. The patient understands the risks of serious complication is low (<1%) and patient agrees to proceed. Hospitalist team will transfer and plan cath tomorrow.  Essential hypertension Morbid obesity BMI greater than 50 Diabetes mellitus Hyperlipidemia Tobacco abuse greater than 1 pack/day smoker EtOH drinks a sixpack daily PAF x1 in 2011 in the setting of alcohol binge Anxiety GERD and lots of GI symptoms worse on Protonix Family history of early CAD  For questions or updates, please contact [facility] Please consult [email or web address] for contact info under   Signed, Michele Lenze PA-C 10/14/2018 10:53 AM   Attending note:  Patient seen and examined.  I reviewed extensive records and updated the chart, also discussed the case with Ms. Lenze PA-C.  Mr. Halsted is a morbidly obese 37-year-old male (403 pounds) with a history of tobacco abuse, regular alcohol use with previous single episode of atrial fibrillation in the setting of binge, anxiety, reflux, hypertension, and family history of premature CAD.  He presents to the ER with described worsening dyspnea on exertion, currently NYHA class III based on description, recurring right upper chest and arm discomfort with activity, also worsening reflux symptoms however and also intermittent abdominal pain.  He uses nitroglycerin with these symptoms, typically with improvement, required 4 tablets most recently.  He has undergone previous cardiac evaluation through the [facility] system reportedly with a normal stress test in 2016, no interval testing apparent.  On evaluation in the emergency room he reports mild residual right upper extremity discomfort.  No shortness of breath at rest.  Blood pressure 150 systolic with heart rate in the 70s to 80s in sinus rhythm by telemetry.  He is morbidly  obese.  Lungs exhibit decreased breath sounds but no wheezing.  Cardiac exam reveals RRR without gallop.  Lab work shows potassium 3.4, BUN 15, creatinine 0.88, normal LFTs, troponin I less than 0.03 up to 0.03, hemoglobin 15.2, platelets 196.  D-dimer negative.  Abdominal CT showed normal-appearing gallbladder, moderate to severe hepatic steatosis.  Chest x-ray unremarkable.  I personally reviewed his ECG which shows a sinus rhythm with small R' in lead V1 and V2.  Patient presents with progressive dyspnea on exertion and right upper chest as well as arm discomfort, typical and atypical features with GI overlay as well.  Cardiac risk factors include morbid obesity, family history of premature CAD, elevated glucose suggestive of diabetes mellitus, hypertension, and ongoing tobacco abuse.  Body habitus makes noninvasive cardiac imaging challenging in terms of definitive assessment.  We discussed proceeding to a diagnostic cardiac catheterization to most clearly evaluate coronary anatomy.  After reviewing the risks and benefits, he is in agreement to proceed.  We have contacted our cardiology service at [facility], patient will be scheduled for tomorrow.  In the interim he will be admitted to the hospitalist service and cardiology will follow him in consultation.  Arron Tetrault G. Vesper Trant, M.D., F.A.C.C.    

## 2018-10-14 NOTE — ED Notes (Signed)
Cadiology at bedside at this time.

## 2018-10-14 NOTE — ED Notes (Signed)
Pt called out to nursing desk with c/o right side chest burning that radiates to right arm, repeat ekg performed given to Dr Manus Gunning,

## 2018-10-14 NOTE — ED Notes (Signed)
Pt to CT at this time.

## 2018-10-14 NOTE — ED Notes (Signed)
Called [facility] with bed assignment and for transport of Pt to [facility].

## 2018-10-14 NOTE — ED Notes (Signed)
Date and time results received: 10/14/18 0343 (use smartphrase ".now" to insert current time)  Test: troponin Critical Value: 0.03  Name of Provider Notified: [facility]   Orders Received? Or Actions Taken?: n/a

## 2018-10-14 NOTE — ED Notes (Signed)
PT left with [facility] at this time.  

## 2018-10-14 NOTE — Plan of Care (Signed)
  Problem: Education: Goal: Understanding of CV disease, CV risk reduction, and recovery process will improve Outcome: Progressing   Problem: Activity: Goal: Ability to return to baseline activity level will improve Outcome: Progressing   

## 2018-10-14 NOTE — Progress Notes (Signed)
38 year old male with a past medical history significant for hypertension, diabetes, morbid obesity, family history of coronary artery disease, tobacco abuse and alcohol abuse.  Who presented to the emergency department secondary to chest pain and shortness of breath.  Troponin 0 0.03; no acute ischemic changes on EKG.  Heart score 5.  Please refer to H&P written by Dr. Sharl Ma for further info/details on admission. After discussing patient's case with cardiology service decision has been made to further stratify him with a heart cath.  Plan -Cycle troponin and EKG -Continue current antihypertensive regimen -Resume home PPI and analgesic therapy. -Extensive discussion regarding alcohol and tobacco cessation provided. -Patient will be transferred to [facility] for heart catheterization, further evaluation and management.   Vassie Loll MD [phone]

## 2018-10-15 ENCOUNTER — Inpatient Hospital Stay (HOSPITAL_COMMUNITY)
Admission: EM | Disposition: A | Discharge status: Home / Self Care | Payer: Self-pay | Source: Home / Self Care | Attending: Internal Medicine

## 2018-10-15 ENCOUNTER — Encounter (HOSPITAL_COMMUNITY): Payer: Self-pay | Admitting: Interventional Cardiology

## 2018-10-15 DIAGNOSIS — Z6841 Body Mass Index (BMI) 40.0 and over, adult: Secondary | ICD-10-CM | POA: Diagnosis not present

## 2018-10-15 DIAGNOSIS — K219 Gastro-esophageal reflux disease without esophagitis: Secondary | ICD-10-CM | POA: Diagnosis present

## 2018-10-15 DIAGNOSIS — R0789 Other chest pain: Secondary | ICD-10-CM | POA: Diagnosis present

## 2018-10-15 DIAGNOSIS — I214 Non-ST elevation (NSTEMI) myocardial infarction: Secondary | ICD-10-CM | POA: Diagnosis present

## 2018-10-15 DIAGNOSIS — I480 Paroxysmal atrial fibrillation: Secondary | ICD-10-CM | POA: Diagnosis present

## 2018-10-15 DIAGNOSIS — Z716 Tobacco abuse counseling: Secondary | ICD-10-CM | POA: Diagnosis not present

## 2018-10-15 DIAGNOSIS — I2510 Atherosclerotic heart disease of native coronary artery without angina pectoris: Secondary | ICD-10-CM | POA: Diagnosis present

## 2018-10-15 DIAGNOSIS — K760 Fatty (change of) liver, not elsewhere classified: Secondary | ICD-10-CM | POA: Diagnosis present

## 2018-10-15 DIAGNOSIS — F17210 Nicotine dependence, cigarettes, uncomplicated: Secondary | ICD-10-CM | POA: Diagnosis present

## 2018-10-15 DIAGNOSIS — G8929 Other chronic pain: Secondary | ICD-10-CM | POA: Diagnosis present

## 2018-10-15 DIAGNOSIS — M5440 Lumbago with sciatica, unspecified side: Secondary | ICD-10-CM | POA: Diagnosis not present

## 2018-10-15 DIAGNOSIS — F411 Generalized anxiety disorder: Secondary | ICD-10-CM | POA: Diagnosis present

## 2018-10-15 DIAGNOSIS — F1010 Alcohol abuse, uncomplicated: Secondary | ICD-10-CM | POA: Diagnosis present

## 2018-10-15 DIAGNOSIS — Z8249 Family history of ischemic heart disease and other diseases of the circulatory system: Secondary | ICD-10-CM | POA: Diagnosis not present

## 2018-10-15 DIAGNOSIS — Z720 Tobacco use: Secondary | ICD-10-CM | POA: Diagnosis not present

## 2018-10-15 DIAGNOSIS — L4050 Arthropathic psoriasis, unspecified: Secondary | ICD-10-CM | POA: Diagnosis present

## 2018-10-15 DIAGNOSIS — E782 Mixed hyperlipidemia: Secondary | ICD-10-CM

## 2018-10-15 DIAGNOSIS — F329 Major depressive disorder, single episode, unspecified: Secondary | ICD-10-CM | POA: Diagnosis present

## 2018-10-15 DIAGNOSIS — I10 Essential (primary) hypertension: Secondary | ICD-10-CM | POA: Diagnosis present

## 2018-10-15 DIAGNOSIS — Z7951 Long term (current) use of inhaled steroids: Secondary | ICD-10-CM | POA: Diagnosis not present

## 2018-10-15 DIAGNOSIS — M5126 Other intervertebral disc displacement, lumbar region: Secondary | ICD-10-CM | POA: Diagnosis present

## 2018-10-15 DIAGNOSIS — I201 Angina pectoris with documented spasm: Secondary | ICD-10-CM | POA: Diagnosis not present

## 2018-10-15 DIAGNOSIS — J449 Chronic obstructive pulmonary disease, unspecified: Secondary | ICD-10-CM | POA: Diagnosis present

## 2018-10-15 DIAGNOSIS — E119 Type 2 diabetes mellitus without complications: Secondary | ICD-10-CM | POA: Diagnosis present

## 2018-10-15 DIAGNOSIS — Z888 Allergy status to other drugs, medicaments and biological substances status: Secondary | ICD-10-CM | POA: Diagnosis not present

## 2018-10-15 DIAGNOSIS — Z79891 Long term (current) use of opiate analgesic: Secondary | ICD-10-CM | POA: Diagnosis not present

## 2018-10-15 HISTORY — PX: LEFT HEART CATH AND CORONARY ANGIOGRAPHY: CATH118249

## 2018-10-15 HISTORY — PX: CORONARY STENT INTERVENTION: CATH118234

## 2018-10-15 HISTORY — PX: INTRAVASCULAR ULTRASOUND/IVUS: CATH118244

## 2018-10-15 LAB — CBC
HCT: 48.3 % (ref 39.0–52.0)
Hemoglobin: 16.1 g/dL (ref 13.0–17.0)
MCH: 31.1 pg (ref 26.0–34.0)
MCHC: 33.3 g/dL (ref 30.0–36.0)
MCV: 93.2 fL (ref 80.0–100.0)
Platelets: 209 10*3/uL (ref 150–400)
RBC: 5.18 MIL/uL (ref 4.22–5.81)
RDW: 12.8 % (ref 11.5–15.5)
WBC: 7.3 10*3/uL (ref 4.0–10.5)
nRBC: 0 % (ref 0.0–0.2)

## 2018-10-15 LAB — CREATININE, SERUM
Creatinine, Ser: 0.89 mg/dL (ref 0.61–1.24)
GFR calc Af Amer: >60 mL/min (ref >60)
GFR calc non Af Amer: >60 mL/min (ref >60)

## 2018-10-15 LAB — POCT ACTIVATED CLOTTING TIME
Activated Clotting Time: 368 seconds
Activated Clotting Time: 489 seconds

## 2018-10-15 LAB — HIV ANTIBODY (ROUTINE TESTING W REFLEX): HIV Screen 4th Generation wRfx: NONREACTIVE

## 2018-10-15 SURGERY — LEFT HEART CATH AND CORONARY ANGIOGRAPHY
Anesthesia: LOCAL

## 2018-10-15 MED ORDER — TICAGRELOR 90 MG PO TABS
ORAL_TABLET | ORAL | Status: DC | PRN
  Administered 2018-10-15: 180 mg via ORAL

## 2018-10-15 MED ORDER — HEART ATTACK BOUNCING BOOK
Freq: Once | Status: AC
  Administered 2018-10-15: 21:00:00
  Filled 2018-10-15: qty 1

## 2018-10-15 MED ORDER — HEPARIN (PORCINE) IN NACL 1000-0.9 UT/500ML-% IV SOLN
INTRAVENOUS | Status: AC
  Filled 2018-10-15: qty 1500

## 2018-10-15 MED ORDER — SODIUM CHLORIDE 0.9 % IV SOLN: INTRAVENOUS | Status: AC

## 2018-10-15 MED ORDER — HYDRALAZINE HCL 20 MG/ML IJ SOLN: 5.0000 mg | INTRAMUSCULAR | Status: AC | PRN

## 2018-10-15 MED ORDER — SODIUM CHLORIDE 0.9% FLUSH: 3.0000 mL | INTRAVENOUS | Status: DC | PRN

## 2018-10-15 MED ORDER — MIDAZOLAM HCL 2 MG/2ML IJ SOLN
INTRAMUSCULAR | Status: AC
  Filled 2018-10-15: qty 2

## 2018-10-15 MED ORDER — TIROFIBAN HCL IN NACL 5-0.9 MG/100ML-% IV SOLN
INTRAVENOUS | Status: AC
  Filled 2018-10-15: qty 100

## 2018-10-15 MED ORDER — HEPARIN SODIUM (PORCINE) 1000 UNIT/ML IJ SOLN
INTRAMUSCULAR | Status: AC
  Filled 2018-10-15: qty 1

## 2018-10-15 MED ORDER — ATORVASTATIN CALCIUM 80 MG PO TABS
80.0000 mg | ORAL_TABLET | Freq: Every day | ORAL | Status: DC
  Administered 2018-10-15: 80 mg via ORAL
  Filled 2018-10-15: qty 1

## 2018-10-15 MED ORDER — FENTANYL CITRATE (PF) 100 MCG/2ML IJ SOLN
INTRAMUSCULAR | Status: DC | PRN
  Administered 2018-10-15 (×2): 25 ug via INTRAVENOUS

## 2018-10-15 MED ORDER — LIDOCAINE HCL (PF) 1 % IJ SOLN
INTRAMUSCULAR | Status: DC | PRN
  Administered 2018-10-15: 2 mL

## 2018-10-15 MED ORDER — FENTANYL CITRATE (PF) 100 MCG/2ML IJ SOLN
INTRAMUSCULAR | Status: AC
  Filled 2018-10-15: qty 2

## 2018-10-15 MED ORDER — IOHEXOL 350 MG/ML SOLN
INTRAVENOUS | Status: DC | PRN
  Administered 2018-10-15: 140 mL via INTRA_ARTERIAL

## 2018-10-15 MED ORDER — TICAGRELOR 90 MG PO TABS
90.0000 mg | ORAL_TABLET | Freq: Two times a day (BID) | ORAL | Status: DC
  Administered 2018-10-15 – 2018-10-16 (×2): 90 mg via ORAL
  Filled 2018-10-15 (×2): qty 1

## 2018-10-15 MED ORDER — MIDAZOLAM HCL 2 MG/2ML IJ SOLN
INTRAMUSCULAR | Status: DC | PRN
  Administered 2018-10-15: 2 mg via INTRAVENOUS
  Administered 2018-10-15 (×2): 1 mg via INTRAVENOUS

## 2018-10-15 MED ORDER — TICAGRELOR 90 MG PO TABS
ORAL_TABLET | ORAL | Status: AC
  Filled 2018-10-15: qty 2

## 2018-10-15 MED ORDER — SODIUM CHLORIDE 0.9% FLUSH
3.0000 mL | Freq: Two times a day (BID) | INTRAVENOUS | Status: DC
  Administered 2018-10-15 (×2): 3 mL via INTRAVENOUS

## 2018-10-15 MED ORDER — ENOXAPARIN SODIUM 40 MG/0.4ML ~~LOC~~ SOLN: 40.0000 mg | SUBCUTANEOUS | Status: DC

## 2018-10-15 MED ORDER — ASPIRIN 81 MG PO CHEW
81.0000 mg | CHEWABLE_TABLET | Freq: Every day | ORAL | Status: DC
  Administered 2018-10-16: 10:00:00 81 mg via ORAL
  Filled 2018-10-15: qty 1

## 2018-10-15 MED ORDER — SODIUM CHLORIDE 0.9 % IV SOLN: 250.0000 mL | INTRAVENOUS | Status: DC | PRN

## 2018-10-15 MED ORDER — THE SENSUOUS HEART BOOK
Freq: Once | Status: AC
  Administered 2018-10-15: 21:00:00
  Filled 2018-10-15: qty 1

## 2018-10-15 MED ORDER — LIDOCAINE HCL (PF) 1 % IJ SOLN
INTRAMUSCULAR | Status: AC
  Filled 2018-10-15: qty 30

## 2018-10-15 MED ORDER — ACETAMINOPHEN 325 MG PO TABS: 650.0000 mg | ORAL_TABLET | ORAL | Status: DC | PRN

## 2018-10-15 MED ORDER — HEPARIN (PORCINE) IN NACL 1000-0.9 UT/500ML-% IV SOLN
INTRAVENOUS | Status: DC | PRN
  Administered 2018-10-15: 500 mL

## 2018-10-15 MED ORDER — ANGIOPLASTY BOOK
Freq: Once | Status: AC
  Administered 2018-10-15: 21:00:00
  Filled 2018-10-15: qty 1

## 2018-10-15 MED ORDER — VERAPAMIL HCL 2.5 MG/ML IV SOLN
INTRAVENOUS | Status: DC | PRN
  Administered 2018-10-15 (×2): 10 mL via INTRA_ARTERIAL

## 2018-10-15 MED ORDER — METOPROLOL TARTRATE 25 MG PO TABS
25.0000 mg | ORAL_TABLET | Freq: Two times a day (BID) | ORAL | Status: DC
  Administered 2018-10-15 – 2018-10-16 (×3): 25 mg via ORAL
  Filled 2018-10-15 (×3): qty 1

## 2018-10-15 MED ORDER — HEPARIN SODIUM (PORCINE) 1000 UNIT/ML IJ SOLN
INTRAMUSCULAR | Status: DC | PRN
  Administered 2018-10-15: 12000 [IU] via INTRAVENOUS
  Administered 2018-10-15: 6000 [IU] via INTRAVENOUS

## 2018-10-15 MED ORDER — NITROGLYCERIN 1 MG/10 ML FOR IR/CATH LAB
INTRA_ARTERIAL | Status: AC
  Filled 2018-10-15: qty 10

## 2018-10-15 MED ORDER — VERAPAMIL HCL 2.5 MG/ML IV SOLN
INTRAVENOUS | Status: AC
  Filled 2018-10-15: qty 2

## 2018-10-15 MED ORDER — LABETALOL HCL 5 MG/ML IV SOLN: 10.0000 mg | INTRAVENOUS | Status: AC | PRN

## 2018-10-15 MED ORDER — ONDANSETRON HCL 4 MG/2ML IJ SOLN: 4.0000 mg | Freq: Four times a day (QID) | INTRAMUSCULAR | Status: DC | PRN

## 2018-10-15 MED ORDER — TIROFIBAN (AGGRASTAT) BOLUS VIA INFUSION
INTRAVENOUS | Status: DC | PRN
  Administered 2018-10-15: 4525 ug via INTRAVENOUS

## 2018-10-15 SURGICAL SUPPLY — 23 items
BALLN  ~~LOC~~ SAPPHIRE 4.5X12 (BALLOONS) ×1
BALLN SAPPHIRE 2.5X15 (BALLOONS) ×2
BALLN SAPPHIRE ~~LOC~~ 2.5X15 (BALLOONS) IMPLANT
BALLN ~~LOC~~ SAPPHIRE 4.5X12 (BALLOONS) ×1
BALLOON SAPPHIRE 2.5X15 (BALLOONS) IMPLANT
BALLOON ~~LOC~~ SAPPHIRE 4.5X12 (BALLOONS) IMPLANT
CATH 5FR JL3.5 JR4 ANG PIG MP (CATHETERS) ×1 IMPLANT
CATH DRAGONFLY OPTIS 2.7FR (CATHETERS) ×1 IMPLANT
CATH LAUNCHER 6FR EBU3.5 (CATHETERS) ×1 IMPLANT
DEVICE RAD COMP TR BAND LRG (VASCULAR PRODUCTS) ×1 IMPLANT
GLIDESHEATH SLEND SS 6F .021 (SHEATH) ×1 IMPLANT
GUIDEWIRE INQWIRE 1.5J.035X260 (WIRE) IMPLANT
HOVERMATT SINGLE USE (MISCELLANEOUS) ×1 IMPLANT
INQWIRE 1.5J .035X260CM (WIRE) ×2
KIT ENCORE 26 ADVANTAGE (KITS) ×1 IMPLANT
KIT HEART LEFT (KITS) ×2 IMPLANT
KIT HEMO VALVE WATCHDOG (MISCELLANEOUS) ×1 IMPLANT
PACK CARDIAC CATHETERIZATION (CUSTOM PROCEDURE TRAY) ×2 IMPLANT
SHEATH PROBE COVER 6X72 (BAG) ×1 IMPLANT
STENT SYNERGY DES 4X20 (Permanent Stent) ×1 IMPLANT
TRANSDUCER W/STOPCOCK (MISCELLANEOUS) ×2 IMPLANT
TUBING CIL FLEX 10 FLL-RA (TUBING) ×2 IMPLANT
WIRE MINAMO 190 (WIRE) ×1 IMPLANT

## 2018-10-15 NOTE — Progress Notes (Addendum)
[facility] PROGRESS NOTE  Scott FareMatthew Waller WUJ:[id number] DOB: 07/22/1981 DOA: 10/13/2018 PCP: Etta QuillBryan, Daniel J, PA-C  Assessment/Plan:  1. Chest pain/NSTEMI. Heart score 5. Patient with multiple CV risk factors. evaluated by cardiology who opine given history and presentation only definitive diagnosis is cath. Underwent cath with stent placement today. Recommending dual antiplatelet for 1 year specifically asa 81mg  daily and ticagrelor 90mg  bid for 12 months  2. GERD-patient refused protonix. continue Pepcid 20 mg p.o. daily.  Continue Carafate  3. Hypertension-blood pressure high end of normal today but he has not yet received home anti-hypertensive med. Continue home medications including valsartan/HCTZ, Cardizem.    4. Alcohol abuse-patient drinks 6 packs of beer every day, no signs and symptoms of alcohol withdrawal . Will start CIWA protocol   Code Status: full Family Communication: parents at bedside Disposition Plan: home tomorrow   Consultants:  Dr Eldridge Dacevaranasi cards  Procedures:  Cath with stent  Antibiotics:    HPI/Subjective: NSTEMI  With cath today s/p stent.   Complains chronic back pain.  Objective: Vitals:   10/15/18 1012 10/15/18 1128  BP: 109/66 (!) 150/90  Pulse: 71 72  Resp: 14 18  Temp:  97.8 F (36.6 C)  SpO2: 97% 96%    Intake/Output Summary (Last 24 hours) at 10/15/2018 1304 Last data filed at 10/15/2018 1148 Gross per 24 hour  Intake 1051.67 ml  Output 750 ml  Net 301.67 ml   Filed Weights   10/14/18 0150 10/14/18 1839 10/15/18 0539  Weight: (!) 183.1 kg (!) 181.4 kg 106.8 kg    Exam:   General:  Obese awake no acute distress  Cardiovascular: rrr no mgr trace LE edema  Respiratory: normal effort BS distant but clear no wheeze  Abdomen: obese soft +BS non-tender no guarding or rebounding  Musculoskeletal: joints without swelling/erythema   Data Reviewed: Basic Metabolic Panel: Recent Labs  Lab 10/13/18 2336  10/14/18 1350 10/15/18 1115  NA 131* 136  --   K 3.4* 3.9  --   CL 97* 101  --   CO2 23 23  --   GLUCOSE 197* 111*  --   BUN 15 12  --   CREATININE 0.88 0.77 0.89  CALCIUM 8.4* 9.2  --    Liver Function Tests: Recent Labs  Lab 10/13/18 2336 10/14/18 1350  AST 23 29  ALT 43 45*  ALKPHOS 32* 32*  BILITOT 0.3 0.6  PROT 7.3 7.5  ALBUMIN 3.9 4.0   Recent Labs  Lab 10/13/18 2336  LIPASE 31   No results for input(s): AMMONIA in the last 168 hours. CBC: Recent Labs  Lab 10/13/18 2336 10/14/18 1350 10/15/18 1115  WBC 6.6 6.4 7.3  HGB 15.2 15.7 16.1  HCT 46.4 49.4 48.3  MCV 93.7 95.6 93.2  PLT 196 200 209   Cardiac Enzymes: Recent Labs  Lab 10/13/18 2336 10/14/18 0246 10/14/18 1350  TROPONINI <0.03 0.03* 0.10*   BNP (last 3 results) No results for input(s): BNP in the last 8760 hours.  ProBNP (last 3 results) No results for input(s): PROBNP in the last 8760 hours.  CBG: No results for input(s): GLUCAP in the last 168 hours.  No results found for this or any previous visit (from the past 240 hour(s)).   Studies: Dg Chest 2 View  Result Date: 10/14/2018 CLINICAL DATA:  Chest pain EXAM: CHEST - 2 VIEW COMPARISON:  08/14/2018 FINDINGS: Mild bibasilar atelectasis. No focal consolidation. No pleural effusion or pneumothorax. The heart is normal in size. Visualized  osseous structures are within normal limits. IMPRESSION: Normal chest radiographs. Electronically Signed   By: Charline Bills M.D.   On: 10/14/2018 00:11   Ct Abdomen Pelvis W Contrast  Result Date: 10/14/2018 CLINICAL DATA:  Right upper quadrant chest pain, prior appendectomy EXAM: CT ABDOMEN AND PELVIS WITH CONTRAST TECHNIQUE: Multidetector CT imaging of the abdomen and pelvis was performed using the standard protocol following bolus administration of intravenous contrast. CONTRAST:  ISOVUE-300 IOPAMIDOL (ISOVUE-300) INJECTION 61% COMPARISON:  08/14/2018 FINDINGS: Lower chest: Lung bases are  clear. Hepatobiliary: Moderate to severe geographic hepatic steatosis with focal fatty sparing along the gallbladder fossa. Gallbladder is underdistended unremarkable. No intrahepatic or extrahepatic ductal dilatation. Pancreas: Within normal limits. Spleen: Within normal limits. Adrenals/Urinary Tract: Adrenal glands are within normal limits. Kidneys are within normal limits.  No hydronephrosis. Bladder is within normal limits. Stomach/Bowel: Stomach is notable for a tiny hiatal hernia. No evidence of bowel obstruction. Prior appendectomy. Vascular/Lymphatic: No evidence of abdominal aortic aneurysm. No suspicious abdominopelvic lymphadenopathy. Reproductive: Prostate is unremarkable. Other: No abdominopelvic ascites. Musculoskeletal: Visualized osseous structures are within normal limits. IMPRESSION: Gallbladder is unremarkable on CT. Moderate to severe hepatic steatosis with focal fatty sparing. No evidence of bowel obstruction.  Prior appendectomy. No CT findings to account for the patient's right abdominal pain. Electronically Signed   By: Charline Bills M.D.   On: 10/14/2018 02:30    Scheduled Meds: . aspirin  81 mg Oral Daily  . diltiazem  240 mg Oral QPM  . [START ON 10/16/2018] enoxaparin (LOVENOX) injection  40 mg Subcutaneous Q24H  . famotidine  40 mg Oral QHS  . fluticasone furoate-vilanterol  1 puff Inhalation Daily  . folic acid  1 mg Oral Daily  . irbesartan  300 mg Oral Daily   And  . hydrochlorothiazide  25 mg Oral Daily  . lamoTRIgine  50 mg Oral Daily  . multivitamin with minerals  1 tablet Oral Daily  . nicotine  21 mg Transdermal Daily  . pantoprazole  40 mg Oral Daily  . sodium chloride flush  3 mL Intravenous Q12H  . sucralfate  1 g Oral TID WC & HS  . thiamine  100 mg Oral Daily   Or  . thiamine  100 mg Intravenous Daily  . ticagrelor  90 mg Oral BID  . traZODone  150 mg Oral QHS   Continuous Infusions: . sodium chloride    . sodium chloride      Active  Problems:   Chest pain   Non-ST elevation (NSTEMI) myocardial infarction [facility])    Time spent: 45 minutes    [facility] M NP  [facility]  If 7PM-7AM, please contact night-coverage at [email or web address], password [facility] 10/15/2018, 1:04 PM  LOS: 0 days       Patient was seen, examined,treatment plan was discussed with the Advance Practice Provider.  I have personally reviewed the clinical findings, labs, EKG, imaging studies and management of this patient in detail. I have also reviewed the orders written for this patient which were under my direction. I agree with the documentation, as recorded by the Advance Practice Provider.   Scott Waller is a 38 y.o. male here with NSTEMI and stent placement today.  Plan is for 12 months of DAPT.  Also needs lifestyle changes with weight loss for his obesity -current BMI is not accurate as weight went from 181kg to 106kg overnight.   Joseph Art, DO

## 2018-10-15 NOTE — Care Management (Signed)
#  6.  S/W LAURA  @ [employer]  #  [phone]  BRILINTA   90 MG BID COVER- YES CO-PAY- $ 381.41  ( PREFERRED BRAND   20 % OF TOTAL COST ) TIER- NO PRIOR APPROVAL- NO  DEDUCTIBLE: NOT MET  PREFERRED PHARMACY : YES [facility]  [facility]

## 2018-10-15 NOTE — Progress Notes (Signed)
Progress Note  Patient Name: Scott Waller Date of Encounter: 10/15/2018  Primary Cardiologist: No primary care provider on file.   Subjective   Complaining of pain in his forearm.  No chest pain or shortness of breath.  Inpatient Medications    Scheduled Meds: . aspirin  81 mg Oral Daily  . diltiazem  240 mg Oral QPM  . [START ON 10/16/2018] enoxaparin (LOVENOX) injection  40 mg Subcutaneous Q24H  . famotidine  40 mg Oral QHS  . fluticasone furoate-vilanterol  1 puff Inhalation Daily  . folic acid  1 mg Oral Daily  . irbesartan  300 mg Oral Daily   And  . hydrochlorothiazide  25 mg Oral Daily  . lamoTRIgine  50 mg Oral Daily  . multivitamin with minerals  1 tablet Oral Daily  . nicotine  21 mg Transdermal Daily  . pantoprazole  40 mg Oral Daily  . sodium chloride flush  3 mL Intravenous Q12H  . sucralfate  1 g Oral TID WC & HS  . thiamine  100 mg Oral Daily   Or  . thiamine  100 mg Intravenous Daily  . ticagrelor  90 mg Oral BID  . traZODone  150 mg Oral QHS   Continuous Infusions: . sodium chloride    . sodium chloride     PRN Meds: sodium chloride, acetaminophen, ALPRAZolam, hydrALAZINE, HYDROcodone-acetaminophen, labetalol, LORazepam **OR** LORazepam, morphine injection, ondansetron (ZOFRAN) IV, ondansetron **OR** [DISCONTINUED] ondansetron (ZOFRAN) IV, sodium chloride flush   Vital Signs    Vitals:   10/15/18 1002 10/15/18 1007 10/15/18 1012 10/15/18 1128  BP: (!) 106/53 (!) 99/57 109/66 (!) 150/90  Pulse: 75 74 71 72  Resp: 14 18 14 18   Temp:    97.8 F (36.6 C)  TempSrc:    Oral  SpO2: 98% 95% 97% 96%  Weight:      Height:        Intake/Output Summary (Last 24 hours) at 10/15/2018 1311 Last data filed at 10/15/2018 1148 Gross per 24 hour  Intake 1051.67 ml  Output 750 ml  Net 301.67 ml   Last 3 Weights 10/15/2018 10/14/2018 10/14/2018  Weight (lbs) 235 lb 8 oz 400 lb 403 lb 9 oz  Weight (kg) 106.822 kg 181.439 kg 183.055 kg  Some encounter  information is confidential and restricted. Go to Review Flowsheets activity to see all data.      Telemetry    Normal sinus rhythm without significant arrhythmia - Personally Reviewed  ECG    Normal sinus rhythm with heart rate 90 bpm, nonspecific ST abnormality - Personally Reviewed  Physical Exam  Morbidly obese male in no distress GEN: No acute distress.   Neck: No JVD Cardiac: RRR, no murmurs, rubs, or gallops.  Respiratory: Clear to auscultation bilaterally. GI: Soft, nontender, non-distended  MS: No edema; No deformity.  Right radial site with TR band in place, small hematoma just proximal to the TR band.  No evidence of hematoma extending up into the forearm. Neuro:  Nonfocal  Psych: Normal affect   Labs    Chemistry Recent Labs  Lab 10/13/18 2336 10/14/18 1350 10/15/18 1115  NA 131* 136  --   K 3.4* 3.9  --   CL 97* 101  --   CO2 23 23  --   GLUCOSE 197* 111*  --   BUN 15 12  --   CREATININE 0.88 0.77 0.89  CALCIUM 8.4* 9.2  --   PROT 7.3 7.5  --   ALBUMIN  3.9 4.0  --   AST 23 29  --   ALT 43 45*  --   ALKPHOS 32* 32*  --   BILITOT 0.3 0.6  --   GFRNONAA >60 >60 >60  GFRAA >60 >60 >60  ANIONGAP 11 12  --      Hematology Recent Labs  Lab 10/13/18 2336 10/14/18 1350 10/15/18 1115  WBC 6.6 6.4 7.3  RBC 4.95 5.17 5.18  HGB 15.2 15.7 16.1  HCT 46.4 49.4 48.3  MCV 93.7 95.6 93.2  MCH 30.7 30.4 31.1  MCHC 32.8 31.8 33.3  RDW 12.9 13.2 12.8  PLT 196 200 209    Cardiac Enzymes Recent Labs  Lab 10/13/18 2336 10/14/18 0246 10/14/18 1350  TROPONINI <0.03 0.03* 0.10*    Recent Labs  Lab 10/13/18 2345  TROPIPOC 0.00     BNPNo results for input(s): BNP, PROBNP in the last 168 hours.   DDimer  Recent Labs  Lab 10/13/18 2236  DDIMER 0.31     Radiology    Dg Chest 2 View  Result Date: 10/14/2018 CLINICAL DATA:  Chest pain EXAM: CHEST - 2 VIEW COMPARISON:  08/14/2018 FINDINGS: Mild bibasilar atelectasis. No focal consolidation. No  pleural effusion or pneumothorax. The heart is normal in size. Visualized osseous structures are within normal limits. IMPRESSION: Normal chest radiographs. Electronically Signed   By: Charline Bills M.D.   On: 10/14/2018 00:11   Ct Abdomen Pelvis W Contrast  Result Date: 10/14/2018 CLINICAL DATA:  Right upper quadrant chest pain, prior appendectomy EXAM: CT ABDOMEN AND PELVIS WITH CONTRAST TECHNIQUE: Multidetector CT imaging of the abdomen and pelvis was performed using the standard protocol following bolus administration of intravenous contrast. CONTRAST:  ISOVUE-300 IOPAMIDOL (ISOVUE-300) INJECTION 61% COMPARISON:  08/14/2018 FINDINGS: Lower chest: Lung bases are clear. Hepatobiliary: Moderate to severe geographic hepatic steatosis with focal fatty sparing along the gallbladder fossa. Gallbladder is underdistended unremarkable. No intrahepatic or extrahepatic ductal dilatation. Pancreas: Within normal limits. Spleen: Within normal limits. Adrenals/Urinary Tract: Adrenal glands are within normal limits. Kidneys are within normal limits.  No hydronephrosis. Bladder is within normal limits. Stomach/Bowel: Stomach is notable for a tiny hiatal hernia. No evidence of bowel obstruction. Prior appendectomy. Vascular/Lymphatic: No evidence of abdominal aortic aneurysm. No suspicious abdominopelvic lymphadenopathy. Reproductive: Prostate is unremarkable. Other: No abdominopelvic ascites. Musculoskeletal: Visualized osseous structures are within normal limits. IMPRESSION: Gallbladder is unremarkable on CT. Moderate to severe hepatic steatosis with focal fatty sparing. No evidence of bowel obstruction.  Prior appendectomy. No CT findings to account for the patient's right abdominal pain. Electronically Signed   By: Charline Bills M.D.   On: 10/14/2018 02:30    Cardiac Studies   Cardiac catheterization October 15, 2018: Coronary Findings   Diagnostic  Dominance: Right  Left Anterior Descending  Prox  LAD lesion 95% stenosed  Prox LAD lesion is 95% stenosed.  Right Coronary Artery  Right Posterior Atrioventricular Branch  Post Atrio lesion 10% stenosed  Post Atrio lesion is 10% stenosed.  Intervention   Prox LAD lesion  Stent  CATHETER LAUNCHER 6FREBU 3.5 guide catheter was inserted. Lesion crossed with guidewire using a WIRE MINAMO 190. Pre-stent angioplasty was performed using a BALLOON SAPPHIRE [place] 2.5X15. A drug-eluting stent was successfully placed using a STENT SYNERGY DES 4X20. Stent strut is well apposed. Post-stent angioplasty was performed using a BALLOON [place] SAPPHIRE 4.5X12. OCT used post stent which showed excellent stent apposition and expansion.  Post-Intervention Lesion Assessment  The intervention was successful. Pre-interventional  TIMI flow is 3. Post-intervention TIMI flow is 3. No complications occurred at this lesion.  There is a 0% residual stenosis post intervention.  Left Heart   Left Ventricle LV end diastolic pressure is mildly elevated. Wall motion not well seen.  Aortic Valve There is no aortic valve stenosis.  Coronary Diagrams   Diagnostic  Dominance: Right    Intervention       Patient Profile     38 y.o. male with diabetes, hyperlipidemia, morbid obesity, presenting with acute coronary syndrome and minimally elevated troponin consistent with non-ST elevation MI  Assessment & Plan    1.  Non-STEMI: Cardiac catheterization findings reviewed.  The patient underwent PCI of critical stenosis in the LAD with a drug-eluting stent.  Agree he should be treated with dual antiplatelet therapy with aspirin and ticagrelor for at least 12 months.  He needs aggressive lifestyle changes and adherence to his medical program.  2.  Type 2 diabetes: Management per hospitalist service. Pt on an ARB.  3.  Essential hypertension: We will change diltiazem to metoprolol in the setting of CAD/acute coronary syndrome.  Otherwise continue current medications.  4.  Mixed  hyperlipidemia: We will start him on high intensity statin drug with atorvastatin 80 mg and check a lipid panel tomorrow morning.   For questions or updates, please contact [facility] Please consult [email or web address] for contact info under        Signed, Tonny Bollman, MD  10/15/2018, 1:11 PM

## 2018-10-15 NOTE — Interval H&P Note (Signed)
Cath Lab Visit (complete for each Cath Lab visit)  Clinical Evaluation Leading to the Procedure:   ACS: Yes.    Non-ACS:    Anginal Classification: CCS IV  Anti-ischemic medical therapy: Minimal Therapy (1 class of medications)  Non-Invasive Test Results: No non-invasive testing performed  Prior CABG: No previous CABG      History and Physical Interval Note:  10/15/2018 8:37 AM  Scott Waller  has presented today for surgery, with the diagnosis of cp  The various methods of treatment have been discussed with the patient and family. After consideration of risks, benefits and other options for treatment, the patient has consented to  Procedure(s): LEFT HEART CATH AND CORONARY ANGIOGRAPHY (N/A) as a surgical intervention .  The patient's history has been reviewed, patient examined, no change in status, stable for surgery.  I have reviewed the patient's chart and labs.  Questions were answered to the patient's satisfaction.     Lance Muss

## 2018-10-15 NOTE — Progress Notes (Signed)
Slight hematoma present proximal to tr band. TR band moved proximal x 2 using blood pressure cuff. TR band inflated to 14cc air. Spo2 98% as measured in right hand.   No additional hematoma growth noted. Area of hematoma is 1/2 inch by 1 1/2 inch.  TR band time begins at 11:10:00

## 2018-10-16 DIAGNOSIS — Z720 Tobacco use: Secondary | ICD-10-CM

## 2018-10-16 DIAGNOSIS — I201 Angina pectoris with documented spasm: Secondary | ICD-10-CM

## 2018-10-16 DIAGNOSIS — M5440 Lumbago with sciatica, unspecified side: Secondary | ICD-10-CM

## 2018-10-16 LAB — CBC
HCT: 48.2 % (ref 39.0–52.0)
HEMOGLOBIN: 15.8 g/dL (ref 13.0–17.0)
MCH: 30.5 pg (ref 26.0–34.0)
MCHC: 32.8 g/dL (ref 30.0–36.0)
MCV: 93.1 fL (ref 80.0–100.0)
Platelets: 202 10*3/uL (ref 150–400)
RBC: 5.18 MIL/uL (ref 4.22–5.81)
RDW: 12.6 % (ref 11.5–15.5)
WBC: 7.8 10*3/uL (ref 4.0–10.5)
nRBC: 0 % (ref 0.0–0.2)

## 2018-10-16 LAB — LIPID PANEL
Cholesterol: 211 mg/dL — ABNORMAL HIGH (ref 0–200)
HDL: 48 mg/dL (ref >40)
LDL Cholesterol: 133 mg/dL — ABNORMAL HIGH (ref 0–99)
Total CHOL/HDL Ratio: 4.4 RATIO
Triglycerides: 149 mg/dL (ref <150)
VLDL: 30 mg/dL (ref 0–40)

## 2018-10-16 LAB — BASIC METABOLIC PANEL
Anion gap: 11 (ref 5–15)
BUN: 14 mg/dL (ref 6–20)
CO2: 25 mmol/L (ref 22–32)
Calcium: 9.4 mg/dL (ref 8.9–10.3)
Chloride: 102 mmol/L (ref 98–111)
Creatinine, Ser: 0.94 mg/dL (ref 0.61–1.24)
GFR calc Af Amer: >60 mL/min (ref >60)
GFR calc non Af Amer: >60 mL/min (ref >60)
Glucose, Bld: 116 mg/dL — ABNORMAL HIGH (ref 70–99)
POTASSIUM: 4.2 mmol/L (ref 3.5–5.1)
Sodium: 138 mmol/L (ref 135–145)

## 2018-10-16 MED ORDER — METOPROLOL TARTRATE 25 MG PO TABS: 25.0000 mg | ORAL_TABLET | Freq: Two times a day (BID) | ORAL | 3 refills | Status: DC

## 2018-10-16 MED ORDER — TICAGRELOR 90 MG PO TABS: 90.0000 mg | ORAL_TABLET | Freq: Two times a day (BID) | ORAL | 3 refills | Status: DC

## 2018-10-16 MED ORDER — ASPIRIN 81 MG PO CHEW: 81.0000 mg | CHEWABLE_TABLET | Freq: Every day | ORAL | 3 refills | Status: DC

## 2018-10-16 MED ORDER — NICOTINE 21 MG/24HR TD PT24: 21.0000 mg | MEDICATED_PATCH | Freq: Every day | TRANSDERMAL | 0 refills | Status: DC

## 2018-10-16 MED ORDER — ATORVASTATIN CALCIUM 80 MG PO TABS: 80.0000 mg | ORAL_TABLET | Freq: Every day | ORAL | 3 refills | Status: DC

## 2018-10-16 MED FILL — ATORVASTATIN CALCIUM 80 MG: 80 | 30 days supply | Qty: 30 | Fill #0 | Status: TO

## 2018-10-16 MED FILL — BRILINTA 90 MG TABLET: 90 | 30 days supply | Qty: 60 | Fill #0 | Status: TO

## 2018-10-16 MED FILL — METOPROLOL TARTRATE 25 MG T: 25 | 30 days supply | Qty: 60 | Fill #0 | Status: TO

## 2018-10-16 MED FILL — NICOTINE 21 MG/24HR PATCH: 21 | 28 days supply | Qty: 28 | Fill #0

## 2018-10-16 MED FILL — ASPIRIN LOW DOSE 81 MG CHEW: 81 | 90 days supply | Qty: 90 | Fill #0 | Status: TO

## 2018-10-16 NOTE — Care Management Note (Signed)
Case Management Note  Patient Details  Name: Scott Waller MRN: [id number] Date of Birth: 01/19/1981  Subjective/Objective:   From home with wife, pta indep, will be on brilinta, NCM spoke with patient , informed him of the co pay of 381.41 since deductible has not been met, but informed him that the 5.00 co pay card will go towards the deductible for 200 dollars when he uses it and when deductilbe is met it will be 5.00 each month.  Patient states the [facility] pharmacy is bringing meds to him  While he is here.                 Action/Plan: DC home when ready.  Expected Discharge Date:  10/16/18               Expected Discharge Plan:  Home/Self Care  In-House Referral:     Discharge planning Services  CM Consult, Medication Assistance  Post Acute Care Choice:    Choice offered to:     DME Arranged:    DME Agency:     HH Arranged:    HH Agency:     Status of Service:  Completed, signed off  If discussed at Long Length of Stay Meetings, dates discussed:    Additional Comments:  ,  [place], RN 10/16/2018, 9:32 AM  

## 2018-10-16 NOTE — Progress Notes (Addendum)
Progress Note  Patient Name: Scott Waller Date of Encounter: 10/16/2018  Primary Cardiologist: Lance MussJayadeep Varanasi, MD   Subjective   Pt with no chest pain or shortness of breath. He has been up with CR and doing well.   Inpatient Medications    Scheduled Meds: . aspirin  81 mg Oral Daily  . atorvastatin  80 mg Oral q1800  . enoxaparin (LOVENOX) injection  40 mg Subcutaneous Q24H  . famotidine  40 mg Oral QHS  . fluticasone furoate-vilanterol  1 puff Inhalation Daily  . folic acid  1 mg Oral Daily  . irbesartan  300 mg Oral Daily   And  . hydrochlorothiazide  25 mg Oral Daily  . lamoTRIgine  50 mg Oral Daily  . metoprolol tartrate  25 mg Oral BID  . multivitamin with minerals  1 tablet Oral Daily  . nicotine  21 mg Transdermal Daily  . pantoprazole  40 mg Oral Daily  . sodium chloride flush  3 mL Intravenous Q12H  . sucralfate  1 g Oral TID WC & HS  . thiamine  100 mg Oral Daily   Or  . thiamine  100 mg Intravenous Daily  . ticagrelor  90 mg Oral BID  . traZODone  150 mg Oral QHS   Continuous Infusions: . sodium chloride     PRN Meds: sodium chloride, acetaminophen, ALPRAZolam, HYDROcodone-acetaminophen, LORazepam **OR** LORazepam, morphine injection, ondansetron (ZOFRAN) IV, ondansetron **OR** [DISCONTINUED] ondansetron (ZOFRAN) IV, sodium chloride flush   Vital Signs    Vitals:   10/15/18 1929 10/15/18 1930 10/16/18 0350 10/16/18 0823  BP: (!) 143/88 (!) 143/88 (!) 107/58 (!) 142/87  Pulse: 75 71 65 87  Resp: 19 20 15  (!) 24  Temp: 98.4 F (36.9 C)  98.4 F (36.9 C) 97.6 F (36.4 C)  TempSrc: Oral  Oral Oral  SpO2: 96% 96% 100% 96%  Weight:   (!) 181.5 kg   Height:        Intake/Output Summary (Last 24 hours) at 10/16/2018 0853 Last data filed at 10/15/2018 2104 Gross per 24 hour  Intake 1065 ml  Output 750 ml  Net 315 ml   Last 3 Weights 10/16/2018 10/15/2018 10/14/2018  Weight (lbs) 400 lb 2.2 oz 235 lb 8 oz 400 lb  Weight (kg) 181.5 kg 106.822 kg  181.439 kg  Some encounter information is confidential and restricted. Go to Review Flowsheets activity to see all data.      Telemetry    Sinus rhythm in the 80s - Personally Reviewed  ECG    Normal sinus rhythm, 68 bpm- Personally Reviewed  Physical Exam   GEN: No acute distress.   Neck: No JVD Cardiac: RRR, no murmurs, rubs, or gallops.  Respiratory: Clear to auscultation bilaterally. GI: Soft, nontender, non-distended  MS: No edema; No deformity. Neuro:  Nonfocal  Psych: Normal affect   Right wrist with small hematoma, stable  Labs    Chemistry Recent Labs  Lab 10/13/18 2336 10/14/18 1350 10/15/18 1115 10/16/18 0331  NA 131* 136  --  138  K 3.4* 3.9  --  4.2  CL 97* 101  --  102  CO2 23 23  --  25  GLUCOSE 197* 111*  --  116*  BUN 15 12  --  14  CREATININE 0.88 0.77 0.89 0.94  CALCIUM 8.4* 9.2  --  9.4  PROT 7.3 7.5  --   --   ALBUMIN 3.9 4.0  --   --   AST  23 29  --   --   ALT 43 45*  --   --   ALKPHOS 32* 32*  --   --   BILITOT 0.3 0.6  --   --   GFRNONAA >60 >60 >60 >60  GFRAA >60 >60 >60 >60  ANIONGAP 11 12  --  11     Hematology Recent Labs  Lab 10/14/18 1350 10/15/18 1115 10/16/18 0331  WBC 6.4 7.3 7.8  RBC 5.17 5.18 5.18  HGB 15.7 16.1 15.8  HCT 49.4 48.3 48.2  MCV 95.6 93.2 93.1  MCH 30.4 31.1 30.5  MCHC 31.8 33.3 32.8  RDW 13.2 12.8 12.6  PLT 200 209 202    Cardiac Enzymes Recent Labs  Lab 10/13/18 2336 10/14/18 0246 10/14/18 1350  TROPONINI <0.03 0.03* 0.10*    Recent Labs  Lab 10/13/18 2345  TROPIPOC 0.00     BNPNo results for input(s): BNP, PROBNP in the last 168 hours.   DDimer  Recent Labs  Lab 10/13/18 2236  DDIMER 0.31     Radiology    No results found.  Cardiac Studies   LEFT HEART CATH AND CORONARY ANGIOGRAPHY  10/15/18  Conclusion    Prox LAD lesion is 95% stenosed.  A drug-eluting stent was successfully placed using a STENT SYNERGY DES 4X20.  Post intervention, there is a 0% residual  stenosis.  LV end diastolic pressure is mildly elevated. LVEDP 25 mm Hg.  There is no aortic valve stenosis.  OCT used post stent which showed excellent stent apposition and stent expansion. No edge dissection.   Dual antiplatelet therapy for at least one year.  He needs aggressive secondary prevention including weight loss.    _____________  Patient Profile     38 y.o. male  with history of hypertension, diabetes, obesity, family history of CAD with grandfather dying in his 21s of an MI another grandfather MI in his 34s, tobacco abuse with 20-pack-year history of smoking, EtOH drinks a sixpack daily, a lot of anxiety.  Had AF single episode in 2011 related to alcohol binge.  Negative stress Cardiolite in 2016.  He last saw Dr. Vilinda Boehringer, cardiologist with [facility] 03/2017.  Having several weeks of progressive worsening of chest pain.    Assessment & Plan    NSTEMI/CAD: Cardiac catheterization done on 10/15/2018 which revealed 95% proximal LAD lesion which was successfully treated with SYNERGY DES.  Will be treated with dual antiplatelet therapy with aspirin and ticagrelor for at least 12 months.  He needs aggressive lifestyle modification and adherence to medical regimen. He is stable for discharge today. I have sent his meds to the [facility] pharmacy. Pt wishes to follow up in our [place] office, Dr. Eldridge Dace.  He has had some shortness of breath related to Brilinta.  We discussed his trying to complete 1 month of Brilinta and hopefully the side effect will resolve.  He agrees.  Patient wants to go back to work tomorrow.  Advised to wait until Monday but he says that he just sits at work using a mouse on the computer.  I will allow him to return to work on Friday with promise to not exert himself.  He says that he has significant anxiety if he has to stay home alone.  His wife will be at work.  Other issues: Type 2 diabetes: Recommend A1c less than 7.  Pt on an ARB.  Essential  hypertension: Diltiazem switched to metoprolol in the setting of CAD/acute coronary syndrome.  Otherwise  continue current medications.  Mixed hyperlipidemia: LDL is 133.  We started the patient on high intensity statin drug with atorvastatin 80 mg and check a lipid in 6 weeks.  Tobacco abuse: Patient has been smoking since age 72.  Used to smoke 3 packs/day now down to 1 pack/day.  Strongly advised on complete cessation to reduce his future risk of cardiovascular events.      For questions or updates, please contact [facility] Please consult [email or web address] for contact info under        Signed, Berton Bon, NP  10/16/2018, 8:53 AM    I have personally seen and examined this patient with Berton Bon, NP. I agree with the assessment and plan as outlined above. He is doing well this am post PCI of the LAD. Will continue ASA and Brilinta. OK to discharge home today. We will arrange cardiac follow up.   Verne Carrow 10/16/2018 9:25 AM

## 2018-10-16 NOTE — Discharge Instructions (Addendum)
PLEASE REMEMBER TO BRING ALL OF YOUR MEDICATIONS TO EACH OF YOUR FOLLOW-UP OFFICE VISITS.  PLEASE ATTEND ALL SCHEDULED FOLLOW-UP APPOINTMENTS.   Activity: Increase activity slowly as tolerated. You may shower, but no soaking baths (or swimming) for 1 week. No driving for 24 hours. No lifting over 5 lbs for 1 week. No sexual activity for 1 week.   You May Return to Work: On Friday (if applicable)  Wound Care: You may wash cath site gently with soap and water. Keep cath site clean and dry. If you notice pain, swelling, bleeding or pus at your cath site, please call [phone].      Radial Site Care  This sheet gives you information about how to care for yourself after your procedure. Your health care provider may also give you more specific instructions. If you have problems or questions, contact your health care provider. What can I expect after the procedure? After the procedure, it is common to have:  Bruising and tenderness at the catheter insertion area. Follow these instructions at home: Medicines  Take over-the-counter and prescription medicines only as told by your health care provider. Insertion site care  Follow instructions from your health care provider about how to take care of your insertion site. Make sure you: ? Wash your hands with soap and water before you change your bandage (dressing). If soap and water are not available, use hand sanitizer. ? Change your dressing as told by your health care provider. ? Leave stitches (sutures), skin glue, or adhesive strips in place. These skin closures may need to stay in place for 2 weeks or longer. If adhesive strip edges start to loosen and curl up, you may trim the loose edges. Do not remove adhesive strips completely unless your health care provider tells you to do that.  Check your insertion site every day for signs of infection. Check for: ? Redness, swelling, or pain. ? Fluid or blood. ? Pus or a bad  smell. ? Warmth.  Do not take baths, swim, or use a hot tub until your health care provider approves.  You may shower 24-48 hours after the procedure, or as directed by your health care provider. ? Remove the dressing and gently wash the site with plain soap and water. ? Pat the area dry with a clean towel. ? Do not rub the site. That could cause bleeding.  Do not apply powder or lotion to the site. Activity   For 24 hours after the procedure, or as directed by your health care provider: ? Do not flex or bend the affected arm. ? Do not push or pull heavy objects with the affected arm. ? Do not drive yourself home from the hospital or clinic. You may drive 24 hours after the procedure unless your health care provider tells you not to. ? Do not operate machinery or power tools.  Do not lift anything that is heavier than 10 lb (4.5 kg), or the limit that you are told, until your health care provider says that it is safe.  Ask your health care provider when it is okay to: ? Return to work or school. ? Resume usual physical activities or sports. ? Resume sexual activity. General instructions  If the catheter site starts to bleed, raise your arm and put firm pressure on the site. If the bleeding does not stop, get help right away. This is a medical emergency.  If you went home on the same day as your procedure, a responsible adult  should be with you for the first 24 hours after you arrive home.  Keep all follow-up visits as told by your health care provider. This is important. Contact a health care provider if:  You have a fever.  You have redness, swelling, or yellow drainage around your insertion site. Get help right away if:  You have unusual pain at the radial site.  The catheter insertion area swells very fast.  The insertion area is bleeding, and the bleeding does not stop when you hold steady pressure on the area.  Your arm or hand becomes pale, cool, tingly, or  numb. These symptoms may represent a serious problem that is an emergency. Do not wait to see if the symptoms will go away. Get medical help right away. Call your local emergency services (911 in the [place] Do not drive yourself to the hospital. Summary  After the procedure, it is common to have bruising and tenderness at the site.  Follow instructions from your health care provider about how to take care of your radial site wound. Check the wound every day for signs of infection.  Do not lift anything that is heavier than 10 lb (4.5 kg), or the limit that you are told, until your health care provider says that it is safe. This information is not intended to replace advice given to you by your health care provider. Make sure you discuss any questions you have with your health care provider. Document Released: 10/21/2010 Document Revised: 10/24/2017 Document Reviewed: 10/24/2017 Elsevier Interactive Patient Education  2019 [employer]   Information about your medication: Brilinta (anti-platelet agent)  Generic Name (Brand): ticagrelor (Brilinta), twice daily medication  PURPOSE: You are taking this medication along with aspirin to lower your chance of having a heart attack, stroke, or blood clots in your heart stent. These can be fatal. Brilinta and aspirin help prevent platelets from sticking together and forming a clot that can block an artery or your stent.   Common SIDE EFFECTS you may experience include: bruising or bleeding more easily, shortness of breath  Do not stop taking BRILINTA without talking to the doctor who prescribes it for you. People who are treated with a stent and stop taking Brilinta too soon, have a higher risk of getting a blood clot in the stent, having a heart attack, or dying. If you stop Brilinta because of bleeding, or for other reasons, your risk of a heart attack or stroke may increase.   Tell all of your doctors and dentists that you are taking Brilinta.  They should talk to the doctor who prescribed Brilinta for you before you have any surgery or invasive procedure.   Contact your health care provider if you experience: severe or uncontrollable bleeding, pink/red/brown urine, vomiting blood or vomit that looks like "coffee grounds", red or black stools (looks like tar), coughing up blood or blood clots ----------------------------------------------------------------------------------------------------------------------

## 2018-10-16 NOTE — Progress Notes (Signed)
CARDIAC REHAB PHASE I   PRE:  Rate/Rhythm: 84 SR    BP: sitting 142/87    SaO2: 96 RA  MODE:  Ambulation: 1000 ft   POST:  Rate/Rhythm: 98 SR    BP: sitting 145/81     SaO2: 98 RA  Tolerated well. Pt has not walked this far without right shoulder pain in two years. Some SOB. Pt happy as his sx have been very debilitating and he has been coming home and drinking excessively since he has not been able to do anything. Long, indepth discussion regarding lifestyle change. He wants to quit drinking and smoking but sts he cannot quit smoking today. He plans to cut down with a goal to quit completely. I encouraged him to seek out ETOH program and he was receptive. He is worried about being at home and his anxiety. Encouraged him to seek positive change (walking, playing with kids, etc). Understands importance of Brilinta/ASA. Will begin walking. Will refer to [facility]. Gave diet sheets. Pt was receptive. [id number]   Harriet Masson CES, ACSM 10/16/2018 9:42 AM

## 2018-10-16 NOTE — Discharge Summary (Signed)
Physician Discharge Summary   Patient ID: Scott Waller MRN: [id number] DOB/AGE: 03-Dec-1980 38 y.o.  Admit date: 10/13/2018 Discharge date: 10/16/2018  Primary Care Physician:  Etta Quill, PA-C   Recommendations for Outpatient Follow-up:  1. Follow up with PCP in 1-2 weeks, patient given information about PCPs in [place] 2. Please obtain BMP/CBC in one week 3. Patient recommended aspirin and Brilinta for at least 12 months, follow closely on any shortness of breath or other side effects  Home Health: None Equipment/Devices:   Discharge Condition: stable  CODE STATUS: FULL  Diet recommendation: Heart healthy diet   Discharge Diagnoses:    . Non-ST elevation (NSTEMI) myocardial infarction (HCC) . Herniated lumbar intervertebral disc . Generalized anxiety disorder . Essential hypertension . Chronic back pain . Mixed hyperlipidemia . Morbid obesity   Consults: Cardiology    Allergies:   Allergies  Allergen Reactions  . Desvenlafaxine Anaphylaxis    High BP     DISCHARGE MEDICATIONS: Allergies as of 10/16/2018      Reactions   Desvenlafaxine Anaphylaxis   High BP      Medication List    STOP taking these medications   diltiazem 240 MG 24 hr capsule Commonly known as:  CARDIZEM CD     TAKE these medications   albuterol (2.5 MG/3ML) 0.083% nebulizer solution Commonly known as:  PROVENTIL Take 3 mLs (2.5 mg total) by nebulization every 4 (four) hours as needed for wheezing or shortness of breath. Notes to patient:  As needed for wheezing and shortness of breath    ALPRAZolam 0.5 MG tablet Commonly known as:  XANAX Take 0.5 mg by mouth 3 (three) times daily as needed for anxiety. Notes to patient:  As needed for anxiety   aspirin 81 MG chewable tablet Chew 1 tablet (81 mg total) by mouth daily. Notes to patient:  Prevents clotting in stent and heart attack    atorvastatin 80 MG tablet Commonly known as:  LIPITOR Take 1 tablet (80 mg total) by  mouth daily at 6 PM. Notes to patient:  Lowers cholesterol and triglycerides    BREO ELLIPTA 100-25 MCG/INH Aepb Generic drug:  fluticasone furoate-vilanterol Take 1 puff by mouth daily. Notes to patient:  Prevents shortness of breath and wheezing    CARAFATE 1 GM/10ML suspension Generic drug:  sucralfate Take 10 mLs by mouth 4 (four) times daily -  with meals and at bedtime. Notes to patient:  Prevent ulcers    Clobetasol Propionate 0.05 % external spray Commonly known as:  TEMOVATE Apply 1 application topically 2 (two) times daily as needed. Notes to patient:  As needed    COSENTYX SENSOREADY (300 MG) 150 MG/ML Soaj Generic drug:  Secukinumab (300 MG Dose) Inject 150 mg into the skin every 30 (thirty) days. Notes to patient:  Psoriasis    fluticasone 50 MCG/ACT nasal spray Commonly known as:  FLONASE Place 2 sprays into both nostrils daily. Notes to patient:  Nasal congestion/allergies   HYDROcodone-acetaminophen 10-325 MG tablet Commonly known as:  NORCO Take 1 tablet by mouth 3 (three) times daily. Notes to patient:  As needed for pain    lamoTRIgine 25 MG tablet Commonly known as:  LAMICTAL Take 1 tablet (25 mg total) by mouth daily. Take one tablet daily for a week and then start taking 2 tablets. What changed:    how much to take  additional instructions Notes to patient:  Depression    methocarbamol 500 MG tablet Commonly known as:  ROBAXIN Take  1 tablet (500 mg total) by mouth 2 (two) times daily. Notes to patient:  Muscle relaxant    metoprolol tartrate 25 MG tablet Commonly known as:  LOPRESSOR Take 1 tablet (25 mg total) by mouth 2 (two) times daily. Notes to patient:  Decreases work of the heart  Lowers blood pressure and heart rate    MULTIVITAMIN MEN PO Take 1 tablet by mouth daily. Notes to patient:  Supplement    nicotine 21 mg/24hr patch Commonly known as:  NICODERM CQ - dosed in mg/24 hours Place 1 patch (21 mg total) onto the skin  daily. Notes to patient:  Smoking cessation    nitroGLYCERIN 0.4 MG SL tablet Commonly known as:  NITROSTAT Place 0.4 mg under the tongue every 5 (five) minutes x 3 doses as needed. If no improvement, call 911 Notes to patient:  As needed for chest pain    ondansetron 8 MG disintegrating tablet Commonly known as:  ZOFRAN ODT Take 1 tablet (8 mg total) by mouth every 8 (eight) hours as needed for nausea or vomiting. Notes to patient:  As needed for nausea   ticagrelor 90 MG Tabs tablet Commonly known as:  BRILINTA Take 1 tablet (90 mg total) by mouth 2 (two) times daily. Notes to patient:  Prevents clotting in stent and heart attack    traZODone 150 MG tablet Commonly known as:  DESYREL Take 150 mg by mouth at bedtime. Notes to patient:  Anxiety and sleep    valsartan-hydrochlorothiazide 320-25 MG tablet Commonly known as:  DIOVAN-HCT Take 1 tablet by mouth daily. Notes to patient:  Lowers blood pressure Weak fluid medication         Brief H and P: For complete details please refer to admission H and P, but in brief Scott Waller  is a 38 y.o. male, with history of obesity, hypertension, psoriatic arthritis came to the hospital with right-sided chest pain with radiation to upper arm and back.  Patient says that he has had these pains intermittently and it has been attributed to esophageal spasm.  Usually takes nitroglycerin at home and gets relief after taking 1 tablet.  Today he took 5 of these tablets and still was having pain so he came to ED for further evaluation.  Patient also noticed to have chest pain on exertion.  Negative cardiac stress test almost 1-1/2-year ago.  No prior history of CAD    Hospital Course:     Non-ST elevation (NSTEMI) myocardial infarction [facility](HCC) -Cardiology was consulted, patient underwent cardiac cath on 10/15/2018 which showed 95% proximal LAD lesion, successfully treated with a DES -Doing well, no acute complaints, cardiology recommended dual  antiplatelet therapy with aspirin and Brilinta for at least 12 months, aggressive lifestyle modification and compliance with meds. -Outpatient follow-up with Dr. Eldridge DaceVaranasi  Type 2 diabetes mellitus -Currently stable, outpatient follow-up with PCP, diet controlled  Essential hypertension -Due to acute ACS, diltiazem switched to metoprolol, continue valsartan HCTZ  Mixed hyperlipidemia LDL 133, patient started on Lipitor 80 mg daily, check lipid panel in 6 weeks  History of chronic back pain Continue pain medications per home regimen  Morbid obesity BMI 50, patient strongly recommended to diet and weight control, lifestyle modifications, exercise  History of COPD Currently stable, continue Breo, albuterol inhaler as needed  Day of Discharge S: No complaints, feeling better, felt slight shortness of breath with Brilinta  BP (!) 145/81   Pulse 84   Temp 97.6 F (36.4 C) (Oral)   Resp 20  Ht 6\' 3"  (1.905 m)   Wt (!) 181.5 kg   SpO2 94%   BMI 50.01 kg/m   Physical Exam: General: Alert and awake oriented x3 not in any acute distress. HEENT: anicteric sclera, pupils reactive to light and accommodation CVS: S1-S2 clear no murmur rubs or gallops Chest: clear to auscultation bilaterally, no wheezing rales or rhonchi Abdomen: Obesity, soft nontender, nondistended, normal bowel sounds Extremities: no cyanosis, clubbing or edema noted bilaterally Neuro: Cranial nerves II-XII intact, no focal neurological deficits   The results of significant diagnostics from this hospitalization (including imaging, microbiology, ancillary and laboratory) are listed below for reference.      Procedures/Studies:  CORONARY STENT INTERVENTION  Intravascular Ultrasound/IVUS  LEFT HEART CATH AND CORONARY ANGIOGRAPHY  Conclusion     Prox LAD lesion is 95% stenosed.  A drug-eluting stent was successfully placed using a STENT SYNERGY DES 4X20.  Post intervention, there is a 0% residual  stenosis.  LV end diastolic pressure is mildly elevated. LVEDP 25 mm Hg.  There is no aortic valve stenosis.  OCT used post stent which showed excellent stent apposition and stent expansion. No edge dissection.   Dual antiplatelet therapy for at least one year.  He needs aggressive secondary prevention including weight loss.        Dg Chest 2 View  Result Date: 10/14/2018 CLINICAL DATA:  Chest pain EXAM: CHEST - 2 VIEW COMPARISON:  08/14/2018 FINDINGS: Mild bibasilar atelectasis. No focal consolidation. No pleural effusion or pneumothorax. The heart is normal in size. Visualized osseous structures are within normal limits. IMPRESSION: Normal chest radiographs. Electronically Signed   By: Charline Bills M.D.   On: 10/14/2018 00:11   Ct Abdomen Pelvis W Contrast  Result Date: 10/14/2018 CLINICAL DATA:  Right upper quadrant chest pain, prior appendectomy EXAM: CT ABDOMEN AND PELVIS WITH CONTRAST TECHNIQUE: Multidetector CT imaging of the abdomen and pelvis was performed using the standard protocol following bolus administration of intravenous contrast. CONTRAST:  ISOVUE-300 IOPAMIDOL (ISOVUE-300) INJECTION 61% COMPARISON:  08/14/2018 FINDINGS: Lower chest: Lung bases are clear. Hepatobiliary: Moderate to severe geographic hepatic steatosis with focal fatty sparing along the gallbladder fossa. Gallbladder is underdistended unremarkable. No intrahepatic or extrahepatic ductal dilatation. Pancreas: Within normal limits. Spleen: Within normal limits. Adrenals/Urinary Tract: Adrenal glands are within normal limits. Kidneys are within normal limits.  No hydronephrosis. Bladder is within normal limits. Stomach/Bowel: Stomach is notable for a tiny hiatal hernia. No evidence of bowel obstruction. Prior appendectomy. Vascular/Lymphatic: No evidence of abdominal aortic aneurysm. No suspicious abdominopelvic lymphadenopathy. Reproductive: Prostate is unremarkable. Other: No abdominopelvic ascites.  Musculoskeletal: Visualized osseous structures are within normal limits. IMPRESSION: Gallbladder is unremarkable on CT. Moderate to severe hepatic steatosis with focal fatty sparing. No evidence of bowel obstruction.  Prior appendectomy. No CT findings to account for the patient's right abdominal pain. Electronically Signed   By: Charline Bills M.D.   On: 10/14/2018 02:30       LAB RESULTS: Basic Metabolic Panel: Recent Labs  Lab 10/14/18 1350 10/15/18 1115 10/16/18 0331  NA 136  --  138  K 3.9  --  4.2  CL 101  --  102  CO2 23  --  25  GLUCOSE 111*  --  116*  BUN 12  --  14  CREATININE 0.77 0.89 0.94  CALCIUM 9.2  --  9.4   Liver Function Tests: Recent Labs  Lab 10/13/18 2336 10/14/18 1350  AST 23 29  ALT 43 45*  ALKPHOS 32*  32*  BILITOT 0.3 0.6  PROT 7.3 7.5  ALBUMIN 3.9 4.0   Recent Labs  Lab 10/13/18 2336  LIPASE 31   No results for input(s): AMMONIA in the last 168 hours. CBC: Recent Labs  Lab 10/15/18 1115 10/16/18 0331  WBC 7.3 7.8  HGB 16.1 15.8  HCT 48.3 48.2  MCV 93.2 93.1  PLT 209 202   Cardiac Enzymes: Recent Labs  Lab 10/14/18 0246 10/14/18 1350  TROPONINI 0.03* 0.10*   BNP: Invalid input(s): POCBNP CBG: No results for input(s): GLUCAP in the last 168 hours.    Disposition and Follow-up: Discharge Instructions    Amb Referral to Cardiac Rehabilitation   Complete by:  As directed    Diagnosis:   Coronary Stents NSTEMI     Diet - low sodium heart healthy   Complete by:  As directed    Increase activity slowly   Complete by:  As directed        DISPOSITION: Home   DISCHARGE FOLLOW-UP Follow-up Information    Berton Bon, NP Follow up.   Specialty:  Cardiology Why:  Cardiology hospital follow-up on 11/07/2018 at 9:00 with Dr. Hoyle Barr NP.  He is arrive 15 minutes early for checkin.  Contact information: [address] [place] [address] [phone]        Etta Quill, PA-C. Schedule an  appointment as soon as possible for a visit in 2 week(s).   Specialty:  Family Medicine Contact information: [address] [place] [address] [phone]            Time coordinating discharge:  40 minutes  Signed:   Thad Ranger M.D. [facility] 10/16/2018, 11:56 AM Pager: [phone]

## 2018-10-17 MED FILL — Nitroglycerin IV Soln 100 MCG/ML in D5W: INTRA_ARTERIAL | Qty: 10 | Status: AC

## 2018-10-17 MED FILL — Tirofiban HCl in NaCl 0.9% IV Soln 5 MG/100ML (Base Equiv): INTRAVENOUS | Qty: 100 | Status: AC

## 2018-10-22 ENCOUNTER — Encounter: Payer: Self-pay | Admitting: Cardiology

## 2018-11-07 ENCOUNTER — Encounter: Payer: Self-pay | Admitting: Cardiology

## 2018-11-07 ENCOUNTER — Ambulatory Visit (INDEPENDENT_AMBULATORY_CARE_PROVIDER_SITE_OTHER): Payer: BLUE CROSS/BLUE SHIELD | Admitting: Cardiology

## 2018-11-07 VITALS — BP 128/82 | HR 88 | Ht 75.0 in | Wt >= 6400 oz

## 2018-11-07 DIAGNOSIS — Z720 Tobacco use: Secondary | ICD-10-CM | POA: Diagnosis not present

## 2018-11-07 DIAGNOSIS — E785 Hyperlipidemia, unspecified: Secondary | ICD-10-CM

## 2018-11-07 DIAGNOSIS — F1010 Alcohol abuse, uncomplicated: Secondary | ICD-10-CM

## 2018-11-07 DIAGNOSIS — I2510 Atherosclerotic heart disease of native coronary artery without angina pectoris: Secondary | ICD-10-CM | POA: Diagnosis not present

## 2018-11-07 DIAGNOSIS — Z6841 Body Mass Index (BMI) 40.0 and over, adult: Secondary | ICD-10-CM

## 2018-11-07 DIAGNOSIS — I10 Essential (primary) hypertension: Secondary | ICD-10-CM

## 2018-11-07 MED ORDER — CLOPIDOGREL BISULFATE 75 MG PO TABS: 75.0000 mg | ORAL_TABLET | Freq: Every day | ORAL | 3 refills | Status: DC

## 2018-11-07 NOTE — Patient Instructions (Addendum)
Medication Instructions:  STOP: BRILINTA after you complete the first month THEN  START: Plavix 75 mg (take 4 pills the first day) then 1 pill daily   If you need a refill on your cardiac medications before your next appointment, please call your pharmacy.   Lab work: Your physician recommends that you return for a FASTING lipid profile hepatic function on 12/02/2018  If you have labs (blood work) drawn today and your tests are completely normal, you will receive your results only by: Marland Kitchen. MyChart Message (if you have MyChart) OR . A paper copy in the mail If you have any lab test that is abnormal or we need to change your treatment, we will call you to review the results.  Testing/Procedures: None  Follow-Up: At [facility], you and your health needs are our priority.  As part of our continuing mission to provide you with exceptional heart care, we have created designated Provider Care Teams.  These Care Teams include your primary Cardiologist (physician) and Advanced Practice Providers (APPs -  Physician Assistants and Nurse Practitioners) who all work together to provide you with the care you need, when you need it. You will need a follow up appointment in 4 months.  Please call our office 2 months in advance to schedule this appointment.  You may see Lance MussJayadeep Varanasi, MD or one of the following Advanced Practice Providers on your designated Care Team:   TylerBrittainy Simmons, PA-C Ronie Spiesayna Dunn, PA-C . Jacolyn ReedyMichele Lenze, PA-C  Any Other Special Instructions Will Be Listed Below (If Applicable).  -Recommend heart healthy/Mediterranean diet, with whole grains, fruits, vegetable, fish, lean meats, nuts, and olive oil. Limit salt. -Recommend moderate walking, 3-5 times/week for 30-50 minutes each session. Aim for at least 150 minutes.week. Goal should be pace of 3 miles/hours, or walking 1.5 miles in 30 minutes -Recommend avoidance of tobacco products. Avoid excess alcohol. -Work on weight  loss   Acute Coronary Syndrome  Acute coronary syndrome (ACS) is a serious problem in which there is suddenly not enough blood and oxygen reaching the heart. ACS can result in chest pain or a heart attack. This condition is a medical emergency. If you have any symptoms of this condition, get help right away. What are the causes? This condition may be caused by:  Buildup of fat and cholesterol inside of the arteries (atherosclerosis). This is the most common cause. The buildup (plaque) can cause blood vessels in the heart (coronary arteries) to become narrow or blocked, which reduces blood flow to the heart. Plaque can also break off and lead to a clot, which can block an artery and cause a heart attack or stroke.  Sudden tightening of the muscles around the coronary arteries (coronary spasm).  Tearing of a coronary artery (spontaneous coronary artery dissection).  Very low blood pressure (hypotension).  An abnormal heartbeat (arrhythmia).  Other medical conditions that cause a decrease of oxygen to the heart, such as anemiaorrespiratory failure.  Using cocaine or methamphetamine. What increases the risk? The following factors may make you more likely to develop this condition:  Age. The risk for ACS increases as you get older.  History of chest pain, heart attack, peripheral artery disease, or stroke.  Having taken chemotherapy or immune-suppressing medicines.  Being male.  Family history of chest pain, heart disease, or stroke.  Smoking.  Not exercising enough.  Being overweight.  High cholesterol.  High blood pressure (hypertension).  Diabetes.  Excessive alcohol use. What are the signs or symptoms? Common  symptoms of this condition include:  Chest pain. The pain may last a long time, or it may stop and come back (recur). It may feel like: ? Crushing or squeezing. ? Tightness, pressure, fullness, or heaviness.  Arm, neck, jaw, or back pain.  Heartburn or  indigestion.  Shortness of breath.  Nausea.  Sudden cold sweats.  Light-headedness.  Dizziness, or passing out.  Tiredness (fatigue). Sometimes there are no symptoms. How is this diagnosed? This condition may be diagnosed based on:  Your medical history and symptoms.  An electrocardiogram (ECG). This imaging test measures the heart's electrical activity.  Blood tests. Cardiac blood tests may need to be repeated at designated time intervals.  Chest X-ray.  A CT scan of the chest.  A coronary angiogram. This is a procedure in which dye is injected into the bloodstream and then X-rays are taken to show if there is a blockage in a coronary artery.  Exercise stress testing.  Echocardiography. This is a test that uses sound waves to produce detailed images of the heart. How is this treated? The treatment is to restore blood flow to the heart as soon as possible. Treatment for this condition may include:  Oxygen therapy.  Medicines, such as: ? Antiplatelet medicines and blood-thinning medicines, such as aspirin. These help prevent blood clots. ? Medicine that dissolves any blood clots (fibrinolytic therapy). ? Blood pressure medicines. ? Nitroglycerin. This helps relieve chest pain and widens blood vessels to improve blood flow. ? Pain medicine. ? Cholesterol-lowering medicine.  Surgery, such as: ? Coronary angioplasty with stent placement. This involves placing a small piece of metal that looks like mesh or a spring into a narrow coronary artery. This widens the artery and keep it open. ? Coronary artery bypass surgery. This involves taking a section of a blood vessel from a different part of your body, and placing it on the blocked coronary artery to allow blood to flow around (bypass) the blockage.  Cardiac rehabilitation. This is a program that helps improve your health and well-being. It includes exercise training, education, and counseling to help you recover. Follow  these instructions at home: Eating and drinking  Eat a heart-healthy diet that includes whole grains, fruits and vegetables, lean proteins, and low-fat or nonfat dairy products.  Limit how much salt (sodium) you eat as told by your health care provider. Follow instructions from your health care provider about any other eating or drinking restrictions, such as limiting foods that are high in fat and processed sugars.  Use healthy cooking methods such as roasting, grilling, broiling, baking, poaching, steaming, or stir-frying.  Talk with a dietitian to learn about healthy cooking methods and how to eat less sodium. Medicines  Take over-the-counter and prescription medicines only as told by your health care provider.  Do not take these medicines unless your health care provider approves: ? Vitamin supplements that contain vitamin A or vitamin E. ? Nonsteroidal anti-inflammatory drugs (NSAIDs), such as ibuprofen, naproxen, or celecoxib. ? Hormone replacement therapy that contains estrogen. If you are taking blood thinners:  Talk with your health care provider before you take any medicines that contain aspirin or NSAIDs. These medicines increase your risk for dangerous bleeding.  Take your medicine exactly as told, at the same time every day.  Avoid activities that could cause injury or bruising, and follow instructions about how to prevent falls.  Wear a medical alert bracelet, and carry a card that lists what medicines you take. Activity  Join a cardiac  rehabilitation program. An exercise plan will be developed for you.  Ask your health care provider: ? What activities and exercises are safe for you. ? If you should follow specific instructions about lifting, driving, or climbing stairs. Lifestyle  Do not use any products that contain nicotine or tobacco, such as cigarettes and e-cigarettes. If you need help quitting, ask your health care provider.  If your health care provider  says that alcohol is safe for you, limit your alcohol intake to no more than 1 drink a day. One drink equals 12 oz of beer, 5 oz of wine, or 1 oz of hard liquor.  Maintain a healthy weight. If you need to lose weight, work with your health care provider to do so safely. General instructions  Tell all the health care providers who care for you about your heart condition, including your dentist. This may affect the medicines or treatment you receive.  Manage any other health conditions you have, such as hypertension or diabetes. These conditions affect your heart.  Learn ways to manage stress.  Get screened for depression, and get mental health treatment if you need it. People with ACS are at higher risk for depression.  Keep your vaccinations up to date. Get the flu shot (influenza vaccine) every year.  If directed, monitor your blood pressure at home.  Keep all follow-up visits as told by your health care provider. This is important. Contact a health care provider if:  You feel overwhelmed or sad.  You have trouble doing your daily activities. Get help right away if:  You have pain in your chest, neck, arm, jaw, stomach, or back that recurs, and: ? It lasts for more than a few minutes. ? It is not relieved by taking the medicineyour health care provider prescribed.  You have unexplained: ? Heavy sweating. ? Heartburn or indigestion. ? Nausea or vomiting. ? Shortness of breath. ? Difficulty breathing. ? Fatigue. ? Nervousness or anxiety. ? Weakness. ? Diarrhea. ? Dark stools or blood in your stool.  You have sudden light-headedness or dizziness.  Your blood pressure is higher than 180/120.  You faint.  You have thoughts about hurting yourself. These symptoms may represent a serious problem that is an emergency. Do not wait to see if the symptoms will go away. Get medical help right away. Call your local emergency services (911 in the [place] Do not drive yourself to the  hospital. If you ever feel like you may hurt yourself or others, or have thoughts about taking your own life, get help right away. You can go to your nearest emergency department or call:  Emergency services (911 in the [place]  A suicide crisis helpline, such as the National Suicide Prevention Lifeline at 6297929066. This is open 24 hours a day. Summary  Acute coronary syndrome (ACS) is when there is not enough blood and oxygen being supplied to the heart. ACS can result in chest pain or a heart attack.  Acute coronary syndrome is a medical emergency. If you have any symptoms of this condition, get help right away.  Treatment includes medicines and procedures to open the blocked arteries and restore blood flow. This information is not intended to replace advice given to you by your health care provider. Make sure you discuss any questions you have with your health care provider. Document Released: 09/18/2005 Document Revised: 05/29/2017 Document Reviewed: 05/29/2017 Elsevier Interactive Patient Education  2019 [employer].

## 2018-11-07 NOTE — Progress Notes (Signed)
Cardiology Office Note:    Date:  11/07/2018   ID:  Scott FareMatthew Claw, DOB 08/28/1981, MRN [id number]  PCP:  Etta QuillBryan, Daniel J, PA-C  Cardiologist:  Lance MussJayadeep Varanasi, MD  Referring MD: Etta QuillBryan, Daniel J, PA-C   Chief Complaint  Patient presents with  . Hospitalization Follow-up    NSTEMI, Post PCI    History of Present Illness:    Scott Waller is a 38 y.o. male with a past medical history significant for hypertension, diabetes, obesity, family history of CAD with grandfather dying in his 6050s of an MI another grandfather MI in his 3860s, tobacco abuse with 20-pack-year history of smoking, EtOH drinks a sixpack daily, a lot of anxiety. Had AF single episode in 2011 related to alcohol binge. Negative stress Cardiolite in 2016. He last saw Dr. Vilinda BoehringerGary Renaldo, cardiologist with [facility] 03/2017.   He presented to the hospital on 10/13/2018 with complaints of several weeks of progressive worsening of chest pain. Troponin became mildly elevated at 0.10.  He was taken for cardiac catheterization on 10/15/2018 which revealed 95% proximal LAD lesion that was successfully stented.  Plans for treatment with dual antiplatelet therapy with aspirin and ticagrelor for at least 12 months.  The patient will need aggressive lifestyle modification and adherence to medical regimen.  The patient did have some shortness of breath related to Brilinta in the hospital.  He was advised to try to complete 1 month of Brilinta and hopefully the side effect would resolve, if not could switch to Plavix.  The patient has type 2 diabetes with A1c less than 7.  He is on an ARB.  His diltiazem was switched to metoprolol in the setting of CAD.  Patient has hyperlipidemia with LDL of 133.  He was started on high intensity statin.  He will need lipids checked and 6 weeks post MI.  The patient has been smoking since age 38, used to smoke 3 packs/day now down to 1 pack/day.  He is here today for hospital follow up. He is having more stamina. He  says that the right arm pain that he was having for the last ~2 years is now gone. He feels like his breathing is worse than prior to cath. He has to gasp every now and then. He started this right after cath, possibly related to Brilinta. He also states that the cost is high, $186.   Right radial cath site is healed.  He also has significant psoriasis on his abdomen and back and he is areas are   He says he is having brief, few seconds, sharp upper left chest pains. He says has arthritis of his shoulders.   He is trying to be more active especially at work. 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 He stopped smoking after hospital but was eating too much so he started back. He is down to 2 packs per week with goal of weaning off.   Trying to eat less and adding fruits and vegetables to his diet slowly. Has cut alcohol down, from 1/5 liquor per week and now 1 pint per week and down from a 12 pack per day to a few beers on weekend.   Past Medical History:  Diagnosis Date  . Anxiety and depression   . Arthritis   . Asthma   . Bronchitis   . CAD (coronary artery disease)    a. 10/15/2018 DES to prox LAD  . GERD (gastroesophageal reflux disease)   . History of atrial fibrillation  During alcohol binge  . Hyperlipidemia with target LDL less than 70   . Hypertension   . Tobacco abuse     Past Surgical History:  Procedure Laterality Date  . APPENDECTOMY    . CORONARY STENT INTERVENTION N/A 10/15/2018   Procedure: CORONARY STENT INTERVENTION;  Surgeon: Corky Crafts, MD;  Location: [facility] INVASIVE CV LAB;  Service: Cardiovascular;  Laterality: N/A;  . INTRAVASCULAR ULTRASOUND/IVUS N/A 10/15/2018   Procedure: Intravascular Ultrasound/IVUS;  Surgeon: Corky Crafts, MD;  Location: [facility] INVASIVE CV LAB;  Service: Cardiovascular;  Laterality: N/A;  . LEFT HEART CATH AND CORONARY ANGIOGRAPHY N/A 10/15/2018   Procedure: LEFT HEART CATH AND CORONARY ANGIOGRAPHY;  Surgeon: Corky Crafts, MD;  Location: [facility] INVASIVE CV LAB;  Service: Cardiovascular;  Laterality: N/A;    Current Medications: Current Meds  Medication Sig  . albuterol (PROVENTIL) (2.5 MG/3ML) 0.083% nebulizer solution Take 3 mLs (2.5 mg total) by nebulization every 4 (four) hours as needed for wheezing or shortness of breath.  . ALPRAZolam (XANAX) 0.5 MG tablet Take 0.5 mg by mouth 3 (three) times daily as needed for anxiety.   Marland Kitchen aspirin 81 MG chewable tablet Chew 1 tablet (81 mg total) by mouth daily.  Marland Kitchen atorvastatin (LIPITOR) 80 MG tablet Take 1 tablet (80 mg total) by mouth daily at 6 PM.  . BREO ELLIPTA 100-25 MCG/INH AEPB Take 1 puff by mouth daily.  Marland Kitchen CARAFATE 1 GM/10ML suspension Take 10 mLs by mouth 4 (four) times daily -  with meals and at bedtime.  . Clobetasol Propionate (TEMOVATE) 0.05 % external spray Apply 1 application topically 2 (two) times daily as needed.  . COSENTYX SENSOREADY, 300 MG, 150 MG/ML SOAJ Inject 150 mg into the skin every 30 (thirty) days.  . fluticasone (FLONASE) 50 MCG/ACT nasal spray Place 2 sprays into both nostrils daily.  Marland Kitchen HYDROcodone-acetaminophen (NORCO) 10-325 MG tablet Take 1 tablet by mouth 3 (three) times daily.   Marland Kitchen lamoTRIgine (LAMICTAL) 25 MG tablet Take 50 mg by mouth daily.  . methocarbamol (ROBAXIN) 500 MG tablet Take 1 tablet (500 mg total) by mouth 2 (two) times daily.  . metoprolol tartrate (LOPRESSOR) 25 MG tablet Take 1 tablet (25 mg total) by mouth 2 (two) times daily.  . Multiple Vitamins-Minerals (MULTIVITAMIN MEN PO) Take 1 tablet by mouth daily.  . nicotine (NICODERM CQ - DOSED IN MG/24 HOURS) 21 mg/24hr patch Place 1 patch (21 mg total) onto the skin daily.  . nitroGLYCERIN (NITROSTAT) 0.4 MG SL tablet Place 0.4 mg under the tongue every 5 (five) minutes x 3 doses as needed. If no improvement, call 911  . ondansetron (ZOFRAN-ODT) 8 MG disintegrating tablet Take 1 tablet (8 mg total) by mouth every 8 (eight) hours as needed for nausea or  vomiting.  . traZODone (DESYREL) 150 MG tablet Take 150 mg by mouth at bedtime.  . valsartan-hydrochlorothiazide (DIOVAN-HCT) 320-25 MG tablet Take 1 tablet by mouth daily.  . [DISCONTINUED] lamoTRIgine (LAMICTAL) 25 MG tablet Take 1 tablet (25 mg total) by mouth daily. Take one tablet daily for a week and then start taking 2 tablets. (Patient taking differently: Take 50 mg by mouth daily. )  . [DISCONTINUED] ticagrelor (BRILINTA) 90 MG TABS tablet Take 1 tablet (90 mg total) by mouth 2 (two) times daily.     Allergies:   Desvenlafaxine   Social History   Socioeconomic History  . Marital status: Married    Spouse name: Not on file  . Number of children:  Not on file  . Years of education: Not on file  . Highest education level: Not on file  Occupational History  . Not on file  Social Needs  . Financial resource strain: Not on file  . Food insecurity:    Worry: Not on file    Inability: Not on file  . Transportation needs:    Medical: Not on file    Non-medical: Not on file  Tobacco Use  . Smoking status: Current Every Day Smoker    Packs/day: 1.00    Years: 25.00    Pack years: 25.00    Types: Cigarettes  . Smokeless tobacco: Never Used  Substance and Sexual Activity  . Alcohol use: Yes    Alcohol/week: 20.0 - 24.0 standard drinks    Types: 20 - 24 Cans of beer per week  . Drug use: No  . Sexual activity: Yes    Partners: Female  Lifestyle  . Physical activity:    Days per week: Not on file    Minutes per session: Not on file  . Stress: Not on file  Relationships  . Social connections:    Talks on phone: Not on file    Gets together: Not on file    Attends religious service: Not on file    Active member of club or organization: Not on file    Attends meetings of clubs or organizations: Not on file    Relationship status: Not on file  Other Topics Concern  . Not on file  Social History Narrative  . Not on file     Family History: The patient's family history  includes Hypertension in his father. ROS:   Please see the history of present illness.     All other systems reviewed and are negative.  EKGs/Labs/Other Studies Reviewed:    The following studies were reviewed today:  LEFT HEART CATH AND CORONARY ANGIOGRAPHY  10/15/18  Conclusion    Prox LAD lesion is 95% stenosed.  A drug-eluting stent was successfully placed using a STENT SYNERGY DES 4X20.  Post intervention, there is a 0% residual stenosis.  LV end diastolic pressure is mildly elevated. LVEDP 25 mm Hg.  There is no aortic valve stenosis.  OCT used post stent which showed excellent stent apposition and stent expansion. No edge dissection.  Dual antiplatelet therapy for at least one year. He needs aggressive secondary prevention including weight loss.     EKG:  EKG is not ordered today.   Recent Labs: 10/14/2018: ALT 45 10/16/2018: BUN 14; Creatinine, Ser 0.94; Hemoglobin 15.8; Platelets 202; Potassium 4.2; Sodium 138   Recent Lipid Panel    Component Value Date/Time   CHOL 211 (H) 10/16/2018 0331   TRIG 149 10/16/2018 0331   HDL 48 10/16/2018 0331   CHOLHDL 4.4 10/16/2018 0331   VLDL 30 10/16/2018 0331   LDLCALC 133 (H) 10/16/2018 0331    Physical Exam:    VS:  BP 128/82   Pulse 88   Ht 6\' 3"  (1.905 m)   Wt (!) 407 lb (184.6 kg)   SpO2 95%   BMI 50.87 kg/m     Wt Readings from Last 3 Encounters:  11/07/18 (!) 407 lb (184.6 kg)  10/16/18 (!) 400 lb 2.2 oz (181.5 kg)  09/28/17 (!) 370 lb (167.8 kg)     Physical Exam  Constitutional: He is oriented to person, place, and time. He appears well-developed and well-nourished.  Obese male  HENT:  Head: Normocephalic and atraumatic.  Neck: Normal range of motion. Neck supple. No JVD present.  Cardiovascular: Normal rate, regular rhythm, normal heart sounds and intact distal pulses. Exam reveals no gallop and no friction rub.  No murmur heard. Pulmonary/Chest: Effort normal and breath sounds normal. No  respiratory distress. He has no wheezes. He has no rales.  Abdominal: Soft. Bowel sounds are normal.  Musculoskeletal: Normal range of motion.        General: No deformity or edema.  Neurological: He is alert and oriented to person, place, and time.  Skin: Skin is warm and dry. Rash noted.  Large dry scaly patches on abdomen and back related to psoriasis  Psychiatric: He has a normal mood and affect. His behavior is normal. Judgment and thought content normal.  Vitals reviewed.   ASSESSMENT:    1. Coronary artery disease involving native coronary artery of native heart without angina pectoris   2. Tobacco abuse   3. Essential hypertension   4. Hyperlipidemia LDL goal <70   5. Class 3 severe obesity due to excess calories with serious comorbidity and body mass index (BMI) of 50.0 to 59.9 in adult (HCC)   6. Alcohol abuse    PLAN:    In order of problems listed above:  1.  CAD -S/p DES to proximal LAD 10/15/2018 -Continuing dual antiplatelet therapy for at least 12 months -No further angina but is having breathlessness that seems to be related to Brilinta.  He agrees to finish the first month of Brilinta and then will switch to Plavix with 300 mg load then 75 mg daily. Explained to pt and he verbalizes understanding -Recommend heart healthy/Mediterranean diet, with whole grains, fruits, vegetable, fish, lean meats, nuts, and olive oil. Limit salt. -Recommend moderate walking, 3-5 times/week for 30-50 minutes each session. Aim for at least 150 minutes.week. Goal should be pace of 3 miles/hours, or walking 1.5 miles in 30 minutes -Recommend avoidance of tobacco products. Avoid excess alcohol.  2.  Hypertension -On valsartan-hydrochlorothiazide 320-25 mg, metoprolol 25 mg twice daily -BP well controlled.  Patient says this is the best his blood pressure has been in a long time.  3.  Hyperlipidemia -LDL 133.  Patient has been started on high intensity statin with atorvastatin 80 mg  daily.  Plan on checking lipid panel around 1 March. Goal LDL<70.   4.  Tobacco abuse -Patient has been a heavy smoker since age 78, 3 packs/day, down to 1 pack/day -Discussed cessation.  He tried stopping cold [place] after the hospital but he found himself eating everything in sight, so he started back.  He is down from 3 packs a day to 2 packs in a week.  He plans to wean himself down to 1 pack in a week and then hopefully full cessation.  5. Morbid obesity -Body mass index is 50.87 kg/m.  -Patient advised on decreasing calorie intake and increasing exercise.  He says that he is trying to add in more fruits and vegetables slowly and he has cut his portions down.   6. Heavy alcohol use -Patient has cut down on his alcohol intake from a 12 pack/day with a fifth of liquor per week to a few beers on the weekends and a pint of liquor per week. -He was afraid to completely cut out alcohol so he is weaning down and I advised that this was a good idea and to continue to wean down over time.   Medication Adjustments/Labs and Tests Ordered: Current medicines are reviewed at length with  the patient today.  Concerns regarding medicines are outlined above. Labs and tests ordered and medication changes are outlined in the patient instructions below:  Patient Instructions  Medication Instructions:  STOP: BRILINTA after you complete the first month THEN  START: Plavix 75 mg (take 4 pills the first day) then 1 pill daily   If you need a refill on your cardiac medications before your next appointment, please call your pharmacy.   Lab work: Your physician recommends that you return for a FASTING lipid profile hepatic function on 12/02/2018  If you have labs (blood work) drawn today and your tests are completely normal, you will receive your results only by: Marland Kitchen MyChart Message (if you have MyChart) OR . A paper copy in the mail If you have any lab test that is abnormal or we need to change your treatment,  we will call you to review the results.  Testing/Procedures: None  Follow-Up: At [facility], you and your health needs are our priority.  As part of our continuing mission to provide you with exceptional heart care, we have created designated Provider Care Teams.  These Care Teams include your primary Cardiologist (physician) and Advanced Practice Providers (APPs -  Physician Assistants and Nurse Practitioners) who all work together to provide you with the care you need, when you need it. You will need a follow up appointment in 4 months.  Please call our office 2 months in advance to schedule this appointment.  You may see Lance Muss, MD or one of the following Advanced Practice Providers on your designated Care Team:   [place], PA-C Ronie Spies, PA-C . Jacolyn Reedy, PA-C  Any Other Special Instructions Will Be Listed Below (If Applicable).  -Recommend heart healthy/Mediterranean diet, with whole grains, fruits, vegetable, fish, lean meats, nuts, and olive oil. Limit salt. -Recommend moderate walking, 3-5 times/week for 30-50 minutes each session. Aim for at least 150 minutes.week. Goal should be pace of 3 miles/hours, or walking 1.5 miles in 30 minutes -Recommend avoidance of tobacco products. Avoid excess alcohol. -Work on weight loss   Acute Coronary Syndrome  Acute coronary syndrome (ACS) is a serious problem in which there is suddenly not enough blood and oxygen reaching the heart. ACS can result in chest pain or a heart attack. This condition is a medical emergency. If you have any symptoms of this condition, get help right away. What are the causes? This condition may be caused by:  Buildup of fat and cholesterol inside of the arteries (atherosclerosis). This is the most common cause. The buildup (plaque) can cause blood vessels in the heart (coronary arteries) to become narrow or blocked, which reduces blood flow to the heart. Plaque can also break off and  lead to a clot, which can block an artery and cause a heart attack or stroke.  Sudden tightening of the muscles around the coronary arteries (coronary spasm).  Tearing of a coronary artery (spontaneous coronary artery dissection).  Very low blood pressure (hypotension).  An abnormal heartbeat (arrhythmia).  Other medical conditions that cause a decrease of oxygen to the heart, such as anemiaorrespiratory failure.  Using cocaine or methamphetamine. What increases the risk? The following factors may make you more likely to develop this condition:  Age. The risk for ACS increases as you get older.  History of chest pain, heart attack, peripheral artery disease, or stroke.  Having taken chemotherapy or immune-suppressing medicines.  Being male.  Family history of chest pain, heart disease, or stroke.  Smoking.  Not exercising enough.  Being overweight.  High cholesterol.  High blood pressure (hypertension).  Diabetes.  Excessive alcohol use. What are the signs or symptoms? Common symptoms of this condition include:  Chest pain. The pain may last a long time, or it may stop and come back (recur). It may feel like: ? Crushing or squeezing. ? Tightness, pressure, fullness, or heaviness.  Arm, neck, jaw, or back pain.  Heartburn or indigestion.  Shortness of breath.  Nausea.  Sudden cold sweats.  Light-headedness.  Dizziness, or passing out.  Tiredness (fatigue). Sometimes there are no symptoms. How is this diagnosed? This condition may be diagnosed based on:  Your medical history and symptoms.  An electrocardiogram (ECG). This imaging test measures the heart's electrical activity.  Blood tests. Cardiac blood tests may need to be repeated at designated time intervals.  Chest X-ray.  A CT scan of the chest.  A coronary angiogram. This is a procedure in which dye is injected into the bloodstream and then X-rays are taken to show if there is a blockage  in a coronary artery.  Exercise stress testing.  Echocardiography. This is a test that uses sound waves to produce detailed images of the heart. How is this treated? The treatment is to restore blood flow to the heart as soon as possible. Treatment for this condition may include:  Oxygen therapy.  Medicines, such as: ? Antiplatelet medicines and blood-thinning medicines, such as aspirin. These help prevent blood clots. ? Medicine that dissolves any blood clots (fibrinolytic therapy). ? Blood pressure medicines. ? Nitroglycerin. This helps relieve chest pain and widens blood vessels to improve blood flow. ? Pain medicine. ? Cholesterol-lowering medicine.  Surgery, such as: ? Coronary angioplasty with stent placement. This involves placing a small piece of metal that looks like mesh or a spring into a narrow coronary artery. This widens the artery and keep it open. ? Coronary artery bypass surgery. This involves taking a section of a blood vessel from a different part of your body, and placing it on the blocked coronary artery to allow blood to flow around (bypass) the blockage.  Cardiac rehabilitation. This is a program that helps improve your health and well-being. It includes exercise training, education, and counseling to help you recover. Follow these instructions at home: Eating and drinking  Eat a heart-healthy diet that includes whole grains, fruits and vegetables, lean proteins, and low-fat or nonfat dairy products.  Limit how much salt (sodium) you eat as told by your health care provider. Follow instructions from your health care provider about any other eating or drinking restrictions, such as limiting foods that are high in fat and processed sugars.  Use healthy cooking methods such as roasting, grilling, broiling, baking, poaching, steaming, or stir-frying.  Talk with a dietitian to learn about healthy cooking methods and how to eat less sodium. Medicines  Take  over-the-counter and prescription medicines only as told by your health care provider.  Do not take these medicines unless your health care provider approves: ? Vitamin supplements that contain vitamin A or vitamin E. ? Nonsteroidal anti-inflammatory drugs (NSAIDs), such as ibuprofen, naproxen, or celecoxib. ? Hormone replacement therapy that contains estrogen. If you are taking blood thinners:  Talk with your health care provider before you take any medicines that contain aspirin or NSAIDs. These medicines increase your risk for dangerous bleeding.  Take your medicine exactly as told, at the same time every day.  Avoid activities that could cause injury or bruising,  and follow instructions about how to prevent falls.  Wear a medical alert bracelet, and carry a card that lists what medicines you take. Activity  Join a cardiac rehabilitation program. An exercise plan will be developed for you.  Ask your health care provider: ? What activities and exercises are safe for you. ? If you should follow specific instructions about lifting, driving, or climbing stairs. Lifestyle  Do not use any products that contain nicotine or tobacco, such as cigarettes and e-cigarettes. If you need help quitting, ask your health care provider.  If your health care provider says that alcohol is safe for you, limit your alcohol intake to no more than 1 drink a day. One drink equals 12 oz of beer, 5 oz of wine, or 1 oz of hard liquor.  Maintain a healthy weight. If you need to lose weight, work with your health care provider to do so safely. General instructions  Tell all the health care providers who care for you about your heart condition, including your dentist. This may affect the medicines or treatment you receive.  Manage any other health conditions you have, such as hypertension or diabetes. These conditions affect your heart.  Learn ways to manage stress.  Get screened for depression, and get  mental health treatment if you need it. People with ACS are at higher risk for depression.  Keep your vaccinations up to date. Get the flu shot (influenza vaccine) every year.  If directed, monitor your blood pressure at home.  Keep all follow-up visits as told by your health care provider. This is important. Contact a health care provider if:  You feel overwhelmed or sad.  You have trouble doing your daily activities. Get help right away if:  You have pain in your chest, neck, arm, jaw, stomach, or back that recurs, and: ? It lasts for more than a few minutes. ? It is not relieved by taking the medicineyour health care provider prescribed.  You have unexplained: ? Heavy sweating. ? Heartburn or indigestion. ? Nausea or vomiting. ? Shortness of breath. ? Difficulty breathing. ? Fatigue. ? Nervousness or anxiety. ? Weakness. ? Diarrhea. ? Dark stools or blood in your stool.  You have sudden light-headedness or dizziness.  Your blood pressure is higher than 180/120.  You faint.  You have thoughts about hurting yourself. These symptoms may represent a serious problem that is an emergency. Do not wait to see if the symptoms will go away. Get medical help right away. Call your local emergency services (911 in the [place] Do not drive yourself to the hospital. If you ever feel like you may hurt yourself or others, or have thoughts about taking your own life, get help right away. You can go to your nearest emergency department or call:  Emergency services (911 in the [place]  A suicide crisis helpline, such as the National Suicide Prevention Lifeline at (302)622-8034. This is open 24 hours a day. Summary  Acute coronary syndrome (ACS) is when there is not enough blood and oxygen being supplied to the heart. ACS can result in chest pain or a heart attack.  Acute coronary syndrome is a medical emergency. If you have any symptoms of this condition, get help right  away.  Treatment includes medicines and procedures to open the blocked arteries and restore blood flow. This information is not intended to replace advice given to you by your health care provider. Make sure you discuss any questions you have with your health care provider.  Document Released: 09/18/2005 Document Revised: 05/29/2017 Document Reviewed: 05/29/2017 Elsevier Interactive Patient Education  [address]2019 [employer]     Signed, Berton BonJanine Jourdin Connors, NP  11/07/2018 9:47 AM    [facility]

## 2018-12-06 ENCOUNTER — Other Ambulatory Visit: Payer: BLUE CROSS/BLUE SHIELD

## 2019-03-04 ENCOUNTER — Telehealth: Payer: Self-pay

## 2019-03-04 NOTE — Telephone Encounter (Signed)
Called pt to set up evisit. Left message asking pt to call the office.  

## 2019-03-06 NOTE — Telephone Encounter (Signed)
Called pt to set up evisit. Left message asking pt to call the office.  

## 2019-03-07 ENCOUNTER — Telehealth: Payer: Self-pay

## 2019-03-07 NOTE — Telephone Encounter (Signed)
Called pt to set up evisit. Left message asking pt to call the office.  

## 2019-03-07 NOTE — Telephone Encounter (Signed)
Virtual Visit Pre-Appointment Phone Call  TELEPHONE CALL NOTE  Scott Waller has been deemed a candidate for a follow-up tele-health visit to limit community exposure during the Covid-19 pandemic. I spoke with the patient via phone to ensure availability of phone/video source, confirm preferred email & phone number, and discuss instructions and expectations.  I reminded Scott Waller to be prepared with any vital sign and/or heart rhythm information that could potentially be obtained via home monitoring, at the time of his visit. I reminded Scott Waller to expect a phone call prior to his visit.   Patient agrees to consent below.  Lattie HawBrittany I Gerber Penza, RN 03/07/2019 5:21 PM    FULL LENGTH CONSENT FOR TELE-HEALTH VISIT   I hereby voluntarily request, consent and authorize [facility] and its employed or contracted physicians, physician assistants, nurse practitioners or other licensed health care professionals (the Practitioner), to provide me with telemedicine health care services (the "Services") as deemed necessary by the treating Practitioner. I acknowledge and consent to receive the Services by the Practitioner via telemedicine. I understand that the telemedicine visit will involve communicating with the Practitioner through live audiovisual communication technology and the disclosure of certain medical information by electronic transmission. I acknowledge that I have been given the opportunity to request an in-person assessment or other available alternative prior to the telemedicine visit and am voluntarily participating in the telemedicine visit.  I understand that I have the right to withhold or withdraw my consent to the use of telemedicine in the course of my care at any time, without affecting my right to future care or treatment, and that the Practitioner or I may terminate the telemedicine visit at any time. I understand that I have the right to inspect all information obtained  and/or recorded in the course of the telemedicine visit and may receive copies of available information for a reasonable fee.  I understand that some of the potential risks of receiving the Services via telemedicine include:  Marland Kitchen. Delay or interruption in medical evaluation due to technological equipment failure or disruption; . Information transmitted may not be sufficient (e.g. poor resolution of images) to allow for appropriate medical decision making by the Practitioner; and/or  . In rare instances, security protocols could fail, causing a breach of personal health information.  Furthermore, I acknowledge that it is my responsibility to provide information about my medical history, conditions and care that is complete and accurate to the best of my ability. I acknowledge that Practitioner's advice, recommendations, and/or decision may be based on factors not within their control, such as incomplete or inaccurate data provided by me or distortions of diagnostic images or specimens that may result from electronic transmissions. I understand that the practice of medicine is not an exact science and that Practitioner makes no warranties or guarantees regarding treatment outcomes. I acknowledge that I will receive a copy of this consent concurrently upon execution via email to the email address I last provided but may also request a printed copy by calling the office of [facility].    I understand that my insurance will be billed for this visit.   I have read or had this consent read to me. . I understand the contents of this consent, which adequately explains the benefits and risks of the Services being provided via telemedicine.  . I have been provided ample opportunity to ask questions regarding this consent and the Services and have had my questions answered to my satisfaction. . I give  my informed consent for the services to be provided through the use of telemedicine in my medical care  By  participating in this telemedicine visit I agree to the above.

## 2019-03-09 NOTE — Progress Notes (Signed)
Virtual Visit via Video Note   This visit type was conducted due to national recommendations for restrictions regarding the COVID-19 Pandemic (e.g. social distancing) in an effort to limit this patient's exposure and mitigate transmission in our community.  Due to his co-morbid illnesses, this patient is at least at moderate risk for complications without adequate follow up.  This format is felt to be most appropriate for this patient at this time.  All issues noted in this document were discussed and addressed.  A limited physical exam was performed with this format.  Please refer to the patient's chart for his consent [facility].   Date:  03/10/2019   ID:  Scott FareMatthew Catoe, DOB 09/28/1981, MRN [id number]  Patient Location: Home Provider Location: Home  PCP:  Etta QuillBryan, Daniel J, PA-C  Cardiologist:  Lance MussJayadeep Rylinn Linzy, MD  Electrophysiologist:  None   Evaluation Performed:  Follow-Up Visit  Chief Complaint:  CAD  History of Present Illness:    Scott Waller is a 38 y.o. male  with a past medical history significant for hypertension, diabetes, obesity, family history of CAD with grandfather dying in his 3550s of an MI another grandfather MI in his 6260s, tobacco abuse with 20-pack-year history of smoking, EtOH drinks a sixpack daily, a lot of anxiety. Had AF single episode in 2011 related to alcohol binge. Negative stress Cardiolite in 2016. He last saw Dr. Vilinda BoehringerGary Renaldo, cardiologist with [facility] 03/2017.   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   Prior records reveal: "He presented to the hospital on 10/13/2018 with complaints ofseveral weeksof progressive worsening of chest pain. Troponin became mildly elevated at 0.10.  He was taken for cardiac catheterization on 10/15/2018 which revealed 95% proximal LAD lesion that was successfully stented.  Plans for treatment with dual antiplatelet therapy with aspirin and ticagrelor for at least 12 months.  The patient will need  aggressive lifestyle modification and adherence to medical regimen.  The patient did have some shortness of breath related to Brilinta in the hospital.  He was advised to try to complete 1 month of Brilinta and hopefully the side effect would resolve, if not could switch to Plavix.  The patient has type 2 diabetes with A1c less than 7.  He is on an ARB.  His diltiazem was switched to metoprolol in the setting of CAD.  Patient has hyperlipidemia with LDL of 133.  He was started on high intensity statin.  He will need lipids checked and 6 weeks post MI.  The patient has been smoking since age 38, used to smoke 3 packs/day now down to 1 pack/day."  He was doing well post PCI.  He was trying to be more active, stop smoking and lose weight. He had some atypical chest pains post PCI.   His work has been running the same as it has had prior to COVID 19.  He has not taken any extra precautions.    He stopped is Lipitor due to joint pains and muscle pains. He has an autoimmune problems.   The patient does not have symptoms concerning for COVID-19 infection (fever, chills, cough, or new shortness of breath).   Denies : Exertional Chest pain. Dizziness. Leg edema. Nitroglycerin use. Orthopnea. Palpitations. Paroxysmal nocturnal dyspnea. Shortness of breath. Syncope.   He has chronic shoulder pains.    Past Medical History:  Diagnosis Date   Anxiety and depression    Arthritis    Asthma    Bronchitis    CAD (  coronary artery disease)    a. 10/15/2018 DES to prox LAD   GERD (gastroesophageal reflux disease)    History of atrial fibrillation    During alcohol binge   Hyperlipidemia with target LDL less than 70    Hypertension    Tobacco abuse    Past Surgical History:  Procedure Laterality Date   APPENDECTOMY     CORONARY STENT INTERVENTION N/A 10/15/2018   Procedure: CORONARY STENT INTERVENTION;  Surgeon: Corky CraftsVaranasi, Chuck Caban S, MD;  Location: [facility] INVASIVE CV LAB;  Service: Cardiovascular;   Laterality: N/A;   INTRAVASCULAR ULTRASOUND/IVUS N/A 10/15/2018   Procedure: Intravascular Ultrasound/IVUS;  Surgeon: Corky CraftsVaranasi, Mathilde Mcwherter S, MD;  [facility];  Service: Cardiovascular;  Laterality: N/A;   LEFT HEART CATH AND CORONARY ANGIOGRAPHY N/A 10/15/2018   Procedure: LEFT HEART CATH AND CORONARY ANGIOGRAPHY;  Surgeon: Corky CraftsVaranasi, Mirtha Jain S, MD;  [facility];  Service: Cardiovascular;  Laterality: N/A;     Current Meds  Medication Sig   albuterol (PROVENTIL) (2.5 MG/3ML) 0.083% nebulizer solution Take 3 mLs (2.5 mg total) by nebulization every 4 (four) hours as needed for wheezing or shortness of breath.   ALPRAZolam (XANAX) 0.5 MG tablet Take 0.5 mg by mouth 3 (three) times daily as needed for anxiety.    aspirin 81 MG chewable tablet Chew 1 tablet (81 mg total) by mouth daily.   atorvastatin (LIPITOR) 80 MG tablet Take 1 tablet (80 mg total) by mouth daily at 6 PM.   BREO ELLIPTA 100-25 MCG/INH AEPB Take 1 puff by mouth daily.   CARAFATE 1 GM/10ML suspension Take 10 mLs by mouth 4 (four) times daily -  with meals and at bedtime.   Clobetasol Propionate (TEMOVATE) 0.05 % external spray Apply 1 application topically 2 (two) times daily as needed.   clopidogrel (PLAVIX) 75 MG tablet Take 1 tablet (75 mg total) by mouth daily.   COSENTYX SENSOREADY, 300 MG, 150 MG/ML SOAJ Inject 150 mg into the skin every 30 (thirty) days.   fluticasone (FLONASE) 50 MCG/ACT nasal spray Place 2 sprays into both nostrils daily.   HYDROcodone-acetaminophen (NORCO) 10-325 MG tablet Take 1 tablet by mouth 3 (three) times daily.    lamoTRIgine (LAMICTAL) 25 MG tablet Take 50 mg by mouth daily.   methocarbamol (ROBAXIN) 500 MG tablet Take 1 tablet (500 mg total) by mouth 2 (two) times daily.   metoprolol tartrate (LOPRESSOR) 25 MG tablet Take 1 tablet (25 mg total) by mouth 2 (two) times daily.   Multiple Vitamins-Minerals (MULTIVITAMIN MEN PO) Take 1 tablet by mouth  daily.   nicotine (NICODERM CQ - DOSED IN MG/24 HOURS) 21 mg/24hr patch Place 1 patch (21 mg total) onto the skin daily.   nitroGLYCERIN (NITROSTAT) 0.4 MG SL tablet Place 0.4 mg under the tongue every 5 (five) minutes x 3 doses as needed. If no improvement, call 911   ondansetron (ZOFRAN-ODT) 8 MG disintegrating tablet Take 1 tablet (8 mg total) by mouth every 8 (eight) hours as needed for nausea or vomiting.   traZODone (DESYREL) 150 MG tablet Take 150 mg by mouth at bedtime.   valsartan-hydrochlorothiazide (DIOVAN-HCT) 320-25 MG tablet Take 1 tablet by mouth daily.     Allergies:   Desvenlafaxine   Social History   Tobacco Use   Smoking status: Current Every Day Smoker    Packs/day: 1.00    Years: 25.00    Pack years: 25.00    Types: Cigarettes   Smokeless tobacco: Never Used  Substance  Use Topics   Alcohol use: Yes    Alcohol/week: 20.0 - 24.0 standard drinks    Types: 20 - 24 Cans of beer per week   Drug use: No     Family Hx: The patient's family history includes Hypertension in his father.  ROS:   Please see the history of present illness.    Chronic joint pains All other systems reviewed and are negative.   Prior CV studies:   The following studies were reviewed today:  Cath films reviewed  Labs/Other Tests and Data Reviewed:    EKG:  An ECG dated Jan 2020 was personally reviewed today and demonstrated:  NSR, no ST segment changes  Recent Labs: 10/14/2018: ALT 45 10/16/2018: BUN 14; Creatinine, Ser 0.94; Hemoglobin 15.8; Platelets 202; Potassium 4.2; Sodium 138   Recent Lipid Panel Lab Results  Component Value Date/Time   CHOL 211 (H) 10/16/2018 03:31 AM   TRIG 149 10/16/2018 03:31 AM   HDL 48 10/16/2018 03:31 AM   CHOLHDL 4.4 10/16/2018 03:31 AM   LDLCALC 133 (H) 10/16/2018 03:31 AM    Wt Readings from Last 3 Encounters:  03/10/19 (!) 390 lb (176.9 kg)  11/07/18 (!) 407 lb (184.6 kg)  10/16/18 (!) 400 lb 2.2 oz (181.5 kg)     Objective:     Vital Signs:  Ht 6\' 3"  (1.905 m)    Wt (!) 390 lb (176.9 kg)    BMI 48.75 kg/m    VITAL SIGNS:  reviewed GEN:  no acute distress RESPIRATORY:  no shortness of breath PSYCH:  normal affect exam limited by phone format  ASSESSMENT & PLAN:    1. CAD: s/p DES to prox LAD in 10/2018.  No angina. COntinue aggressive secondary prevention.  2. HTN: Checked by PMD and well controlled per his report.  HR 88.   3. Hyperlipidemia: Off the atorvastatin for a month.  WIll check with PharmD if he is a candidate for a different medicine, like PCSK9 inhibitor. 4. Tobacco abuse: He continues to smoke. He needs to quit smoking. 5. Morbid obesity: He has lost a little weight since January.   6. Heavy alcohol usage: Try to minimize.  No bleeding issues.   COVID-19 Education: The signs and symptoms of COVID-19 were discussed with the patient and how to seek care for testing (follow up with PCP or arrange E-visit).  The importance of social distancing was discussed today. He is living the same life that he did before COVID 19.    Time:   Today, I have spent 15 minutes with the patient with telehealth technology discussing the above problems.     Medication Adjustments/Labs and Tests Ordered: Current medicines are reviewed at length with the patient today.  Concerns regarding medicines are outlined above.   Tests Ordered: No orders of the defined types were placed in this encounter.   Medication Changes: No orders of the defined types were placed in this encounter.   Disposition:  Follow up in 6 month(s)  Signed, Larae Grooms, MD  03/10/2019 11:29 AM    [facility]

## 2019-03-10 ENCOUNTER — Encounter: Payer: Self-pay | Admitting: Interventional Cardiology

## 2019-03-10 ENCOUNTER — Telehealth (INDEPENDENT_AMBULATORY_CARE_PROVIDER_SITE_OTHER): Payer: 59 | Admitting: Interventional Cardiology

## 2019-03-10 ENCOUNTER — Telehealth: Payer: Self-pay | Admitting: Pharmacist

## 2019-03-10 ENCOUNTER — Other Ambulatory Visit: Payer: Self-pay

## 2019-03-10 VITALS — Ht 75.0 in | Wt 390.0 lb

## 2019-03-10 DIAGNOSIS — I10 Essential (primary) hypertension: Secondary | ICD-10-CM | POA: Diagnosis not present

## 2019-03-10 DIAGNOSIS — E785 Hyperlipidemia, unspecified: Secondary | ICD-10-CM | POA: Diagnosis not present

## 2019-03-10 DIAGNOSIS — I25118 Atherosclerotic heart disease of native coronary artery with other forms of angina pectoris: Secondary | ICD-10-CM | POA: Diagnosis not present

## 2019-03-10 DIAGNOSIS — Z6841 Body Mass Index (BMI) 40.0 and over, adult: Secondary | ICD-10-CM

## 2019-03-10 DIAGNOSIS — F1010 Alcohol abuse, uncomplicated: Secondary | ICD-10-CM | POA: Diagnosis not present

## 2019-03-10 DIAGNOSIS — Z720 Tobacco use: Secondary | ICD-10-CM

## 2019-03-10 NOTE — Telephone Encounter (Signed)
Called pt to discuss lipids - referred by Dr Irish Lack due to atorvastatin 80mg  intolerance. Will discuss rechallenging with another statin vs PCSK9i.

## 2019-03-10 NOTE — Patient Instructions (Signed)

## 2019-03-11 MED ORDER — ROSUVASTATIN CALCIUM 20 MG PO TABS: 20.0000 mg | ORAL_TABLET | Freq: Every day | ORAL | 11 refills | Status: DC

## 2019-03-11 NOTE — Telephone Encounter (Signed)
Spoke with pt - he reports unbearable joint pain on atorvastatin. He has psoriatic arthritis and reports that the atorvastatin worsened this as well. LDL 133 above goal < 70 due to ASCVD. He has not tried any other statins in the past and is agreeable to trying rosuvastatin 20mg  daily. Advised pt to call clinic if he develops any side effects on rosuvastatin. Otherwise, I will call pt in 1 months to assess tolerability and will schedule follow up labs at that time if pt is tolerating rosuvastatin well.

## 2019-04-08 ENCOUNTER — Telehealth: Payer: Self-pay | Admitting: Pharmacist

## 2019-04-08 NOTE — Telephone Encounter (Signed)
Left message to follow up with tolerability of rosuvastatin 20mg  daily. If tolerating well, will schedule lipid panel in 4-8 weeks from now.

## 2019-04-11 NOTE — Addendum Note (Signed)
Addended by: Willmer Fellers E on: 04/11/2019 09:06 AM   Modules accepted: Orders

## 2019-04-11 NOTE — Telephone Encounter (Signed)
Pt states he has been experiencing joint pain since the day he started on rosuvastatin 20mg . He is willing to cut his tablets in half and try 10mg  daily - advised he can try 10mg  every other day if he doesn't tolerate daily dosing. Will call pt in a week to follow up with tolerability since his previous myalgias occurred after 1 day of statin therapy.

## 2019-04-18 NOTE — Telephone Encounter (Signed)
Called pt to f/u with lipids. He is tolerating lower dose of rosuvastatin 10mg  daily better. Will recheck lipids in 2 months to assess response to therapy.

## 2019-06-18 ENCOUNTER — Other Ambulatory Visit: Payer: 59

## 2019-06-19 ENCOUNTER — Telehealth: Payer: Self-pay | Admitting: Pharmacist

## 2019-06-19 NOTE — Telephone Encounter (Signed)
Pt missed lab work yesterday to check efficacy of rosuvastatin 10mg  daily. Called pt and left message to r/s labs.

## 2019-07-02 NOTE — Telephone Encounter (Signed)
Left message again.

## 2019-07-10 NOTE — Telephone Encounter (Signed)
Called pt again, rescheduled lab work for 10/21.

## 2019-07-23 ENCOUNTER — Other Ambulatory Visit: Payer: Self-pay

## 2019-07-24 NOTE — Telephone Encounter (Addendum)
Pt missed lab work again. Called to r/s. Pt states he had a fever and his wife canceled lab appt, don't see that this was done. R/s labs for next week.

## 2019-07-31 ENCOUNTER — Other Ambulatory Visit: Payer: Self-pay

## 2019-08-04 ENCOUNTER — Telehealth: Payer: Self-pay | Admitting: Pharmacist

## 2019-08-04 NOTE — Telephone Encounter (Signed)
Called pt and left message to r/s labs to check lipids and LFTs. This is 3rd lab appt that pt has missed.

## 2019-10-02 ENCOUNTER — Other Ambulatory Visit: Payer: Self-pay

## 2019-10-02 MED ORDER — CLOPIDOGREL BISULFATE 75 MG PO TABS: 75.0000 mg | ORAL_TABLET | Freq: Every day | ORAL | 3 refills | Status: DC

## 2020-01-27 IMAGING — CT CT ABD-PELV W/ CM
1 of 2 series · 13 of 32 positions shown, 18 images · IV contrast (iopamidol)
Comparison: None.

CLINICAL DATA: Initial evaluation for acute abdominal pain, nausea.

EXAM:
CT ABDOMEN AND PELVIS WITH CONTRAST
TECHNIQUE: Multidetector CT imaging of the abdomen and pelvis was performed
using the standard protocol following bolus administration of
intravenous contrast.
CONTRAST:  100mL ST57LX-QEE IOPAMIDOL (ST57LX-QEE) INJECTION 61%

[Series 3: axial st · axial · 0.98mm/px · z∈[-293,+172]mm · 13 of 105 slices shown, 18 images]
[im 6/105  soft-tissue]
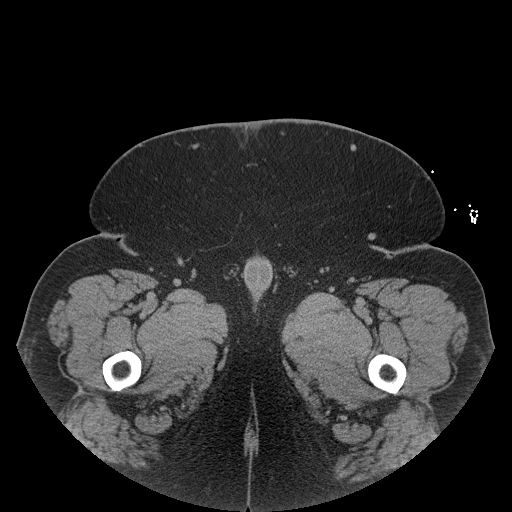
[im 6/105  bone]
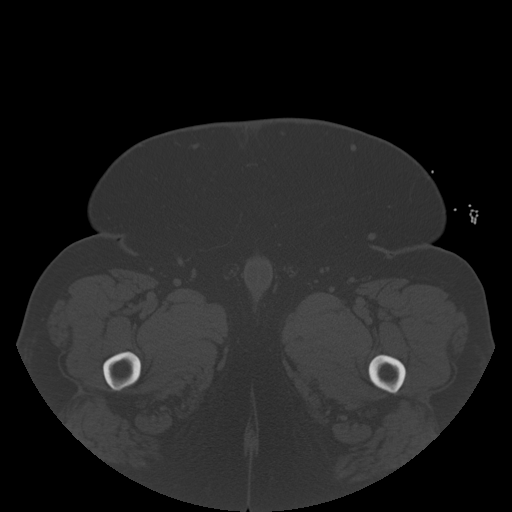
[im 17/105  soft-tissue]
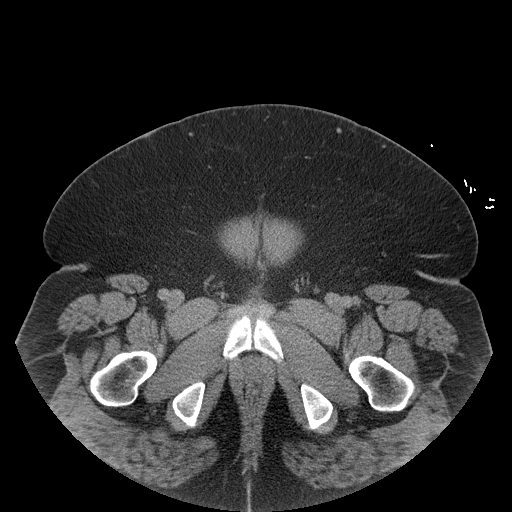
[im 22/105  soft-tissue]
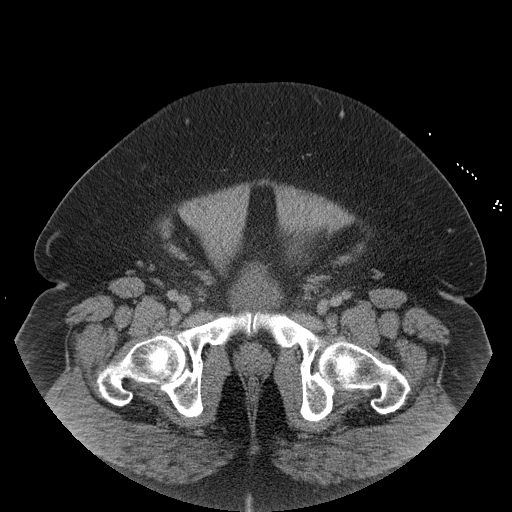
[im 33/105  soft-tissue]
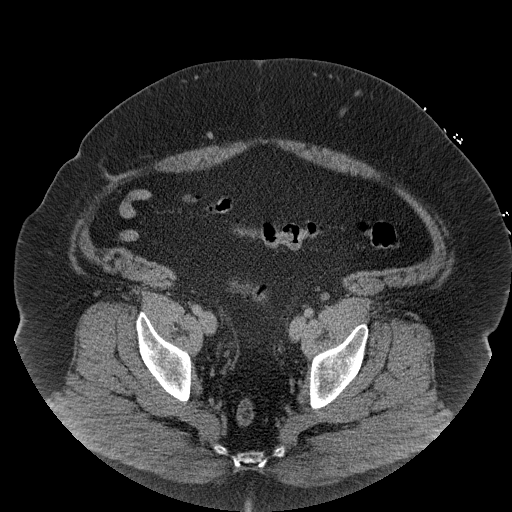
[im 39/105  soft-tissue]
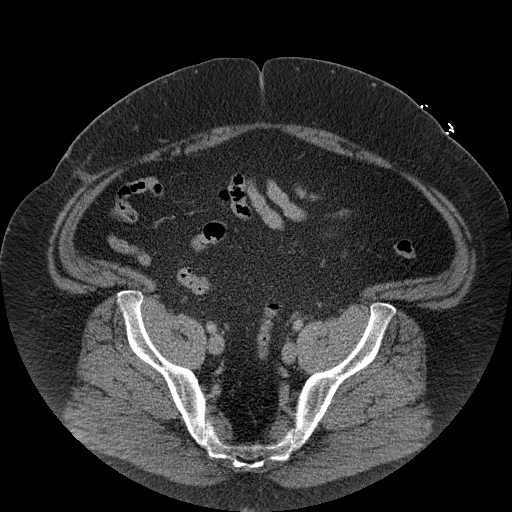
[im 50/105  soft-tissue]
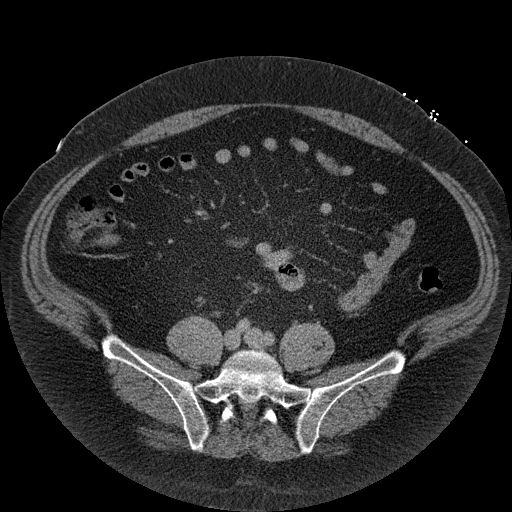
[im 55/105  soft-tissue]
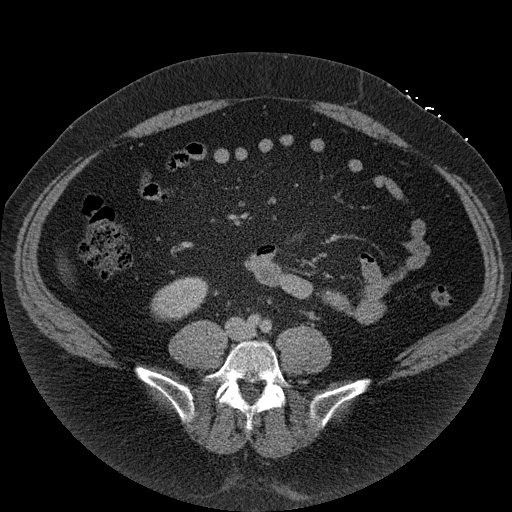
[im 66/105  soft-tissue]
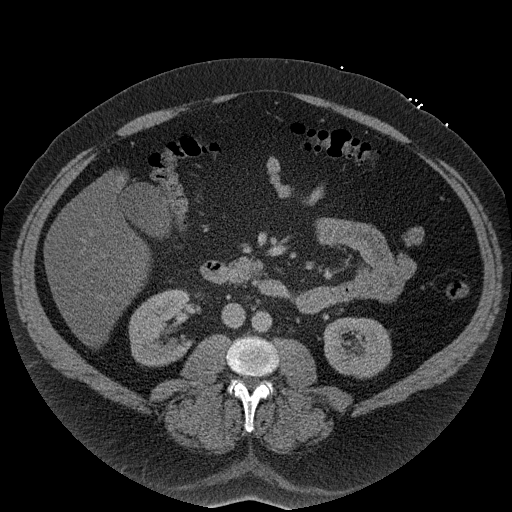
[im 72/105  soft-tissue]
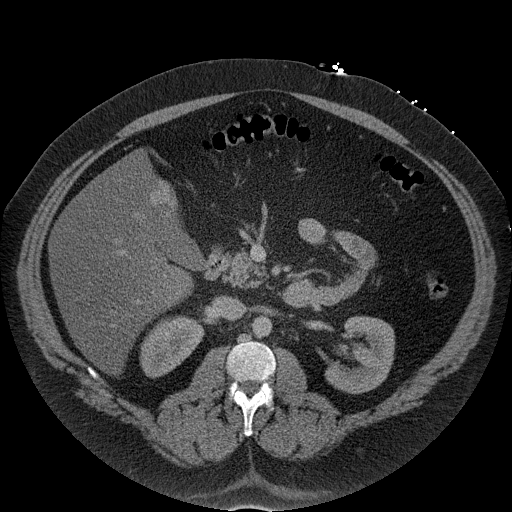
[im 72/105  bone]
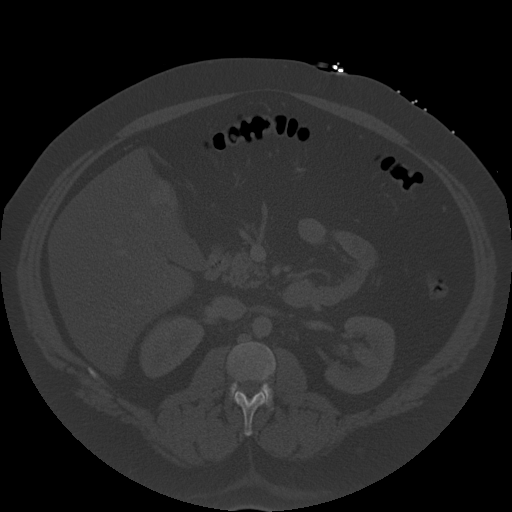
[im 83/105  soft-tissue]
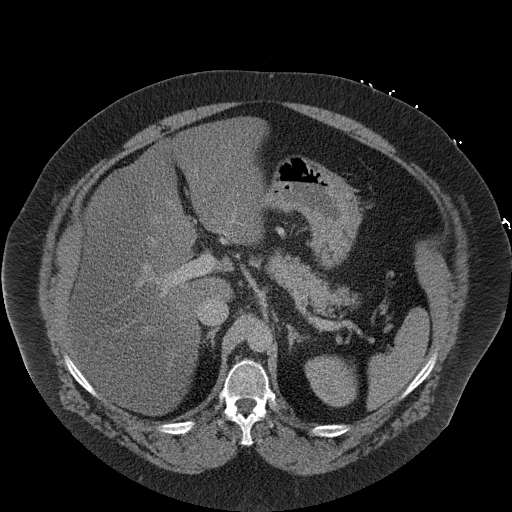
[im 83/105  lung]
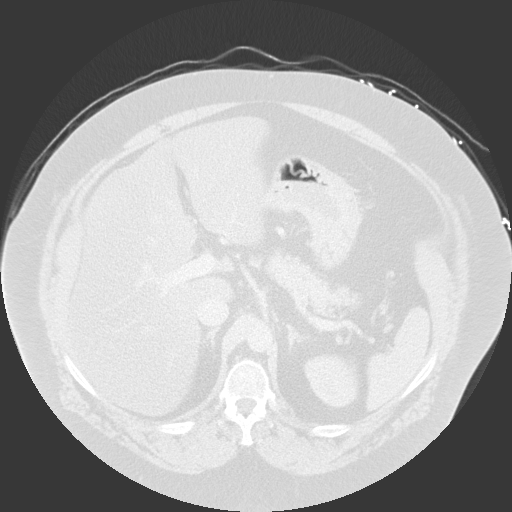
[im 88/105  soft-tissue]
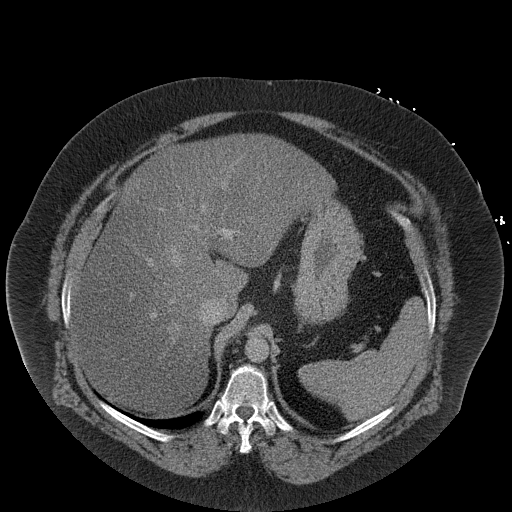
[im 88/105  lung]
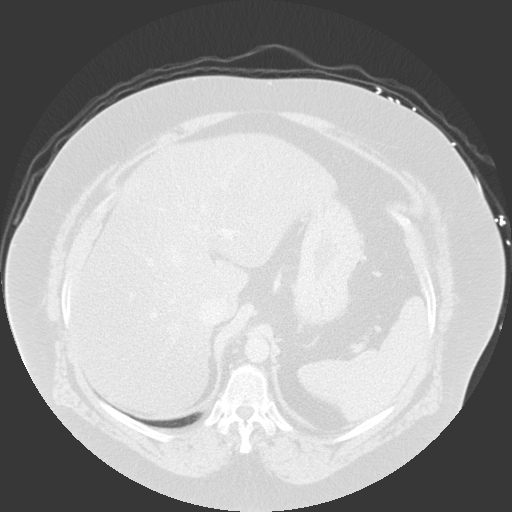
[im 94/105  lung]
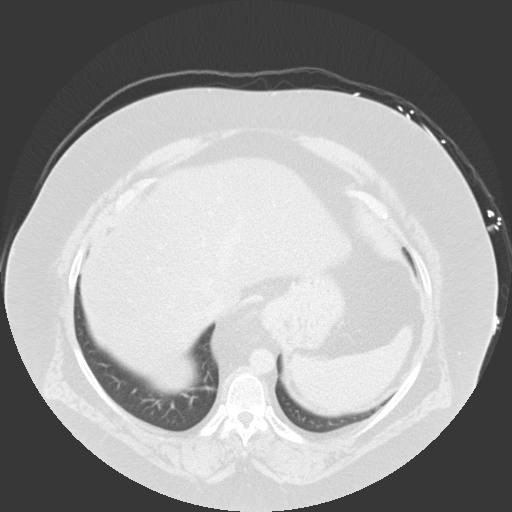
[im 99/105  soft-tissue]
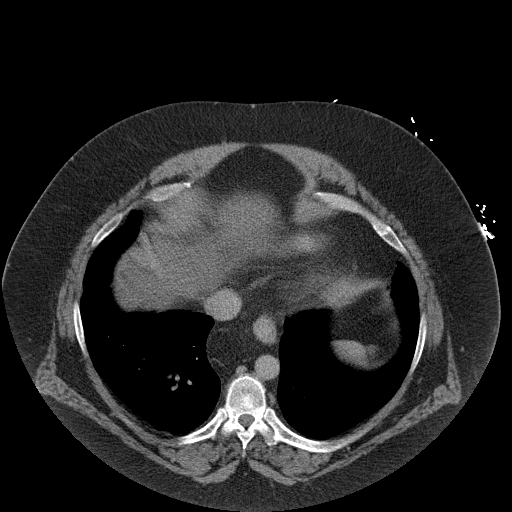
[im 99/105  lung]
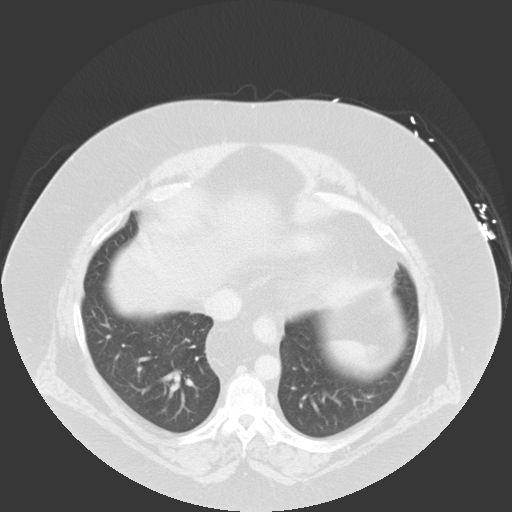

[13 of 32 positions shown; findings below may reference images not displayed]

FINDINGS: Lower chest: Mild scattered subsegmental atelectatic changes present
at the lung bases. Visualized lungs are otherwise clear.

Hepatobiliary: Diffuse hypoattenuation liver consistent with
steatosis. Areas of focal fatty sparing noted adjacent to the
gallbladder fossa. Liver otherwise unremarkable. Gallbladder within
normal limits. No imaging findings to suggest cholelithiasis or
acute cholecystitis. No biliary dilatation.

Pancreas: Pancreas within normal limits. Focal fat deposition noted
at the level of the uncinate process.

Spleen: Spleen within normal limits.

Adrenals/Urinary Tract: Adrenal glands are normal. Kidneys equal in
size with symmetric enhancement. No nephrolithiasis, hydronephrosis,
or focal enhancing renal mass. No hydroureter. Partially distended
bladder within normal limits.

Stomach/Bowel: Stomach within normal limits. No evidence for bowel
obstruction. Appendix is surgically absent. No acute inflammatory
changes seen about the bowels.

Vascular/Lymphatic: Normal intravascular enhancement seen throughout
the intra-abdominal aorta and its branch vessels. Mild atheromatous
plaque about the aortic bifurcation. No pathologically enlarged
lymph nodes identified within the abdomen and pelvis.

Reproductive: Prostate within normal limits.

Other: No free air or fluid.

Musculoskeletal: No acute osseous abnormality. No discrete lytic or
blastic osseous lesions. Postsurgical scarring noted within the
subcutaneous fat of the right ventral abdomen. Minimal hazy
stranding noted within the subcutaneous fat of the lower mid ventral
abdomen (series 3, image 102), nonspecific. No loculated collection.
IMPRESSION: 1. No CT evidence for acute intra-abdominal or pelvic process.
2. Mild hazy stranding within the subcutaneous fat of the lower mid
ventral abdomen, nonspecific, but could reflect mild
infection/cellulitis. Correlation with physical exam recommended. No
loculated collections.
3. Hepatic steatosis.
4. Prior appendectomy.

## 2020-01-27 IMAGING — US US ABDOMEN LIMITED
1 series · 14 of 25 positions shown · non-contrast
Comparison: None.

CLINICAL DATA: Right upper abdominal tenderness

EXAM:
ULTRASOUND ABDOMEN LIMITED RIGHT UPPER QUADRANT

[Series 1: us abdomen limited · 14 of 110 slices shown]
[im 1/110]
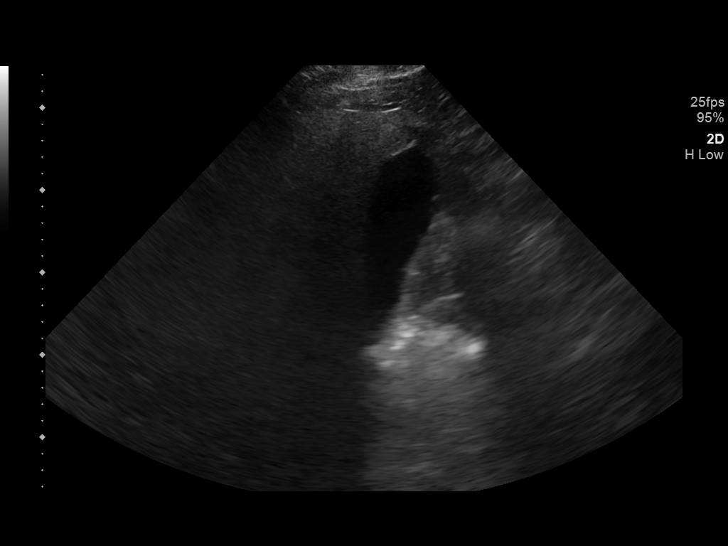
[im 10/110]
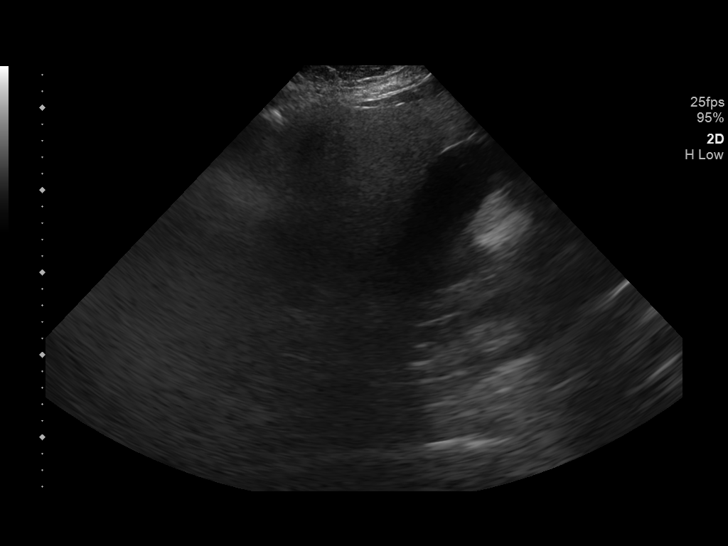
[im 19/110]
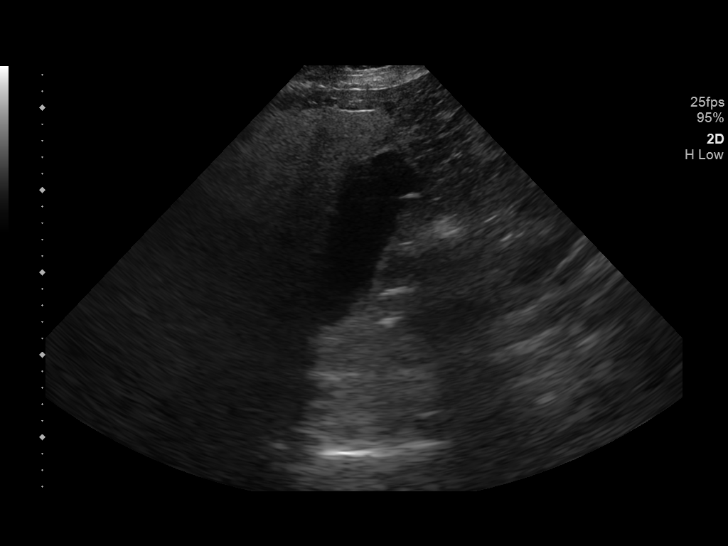
[im 28/110]
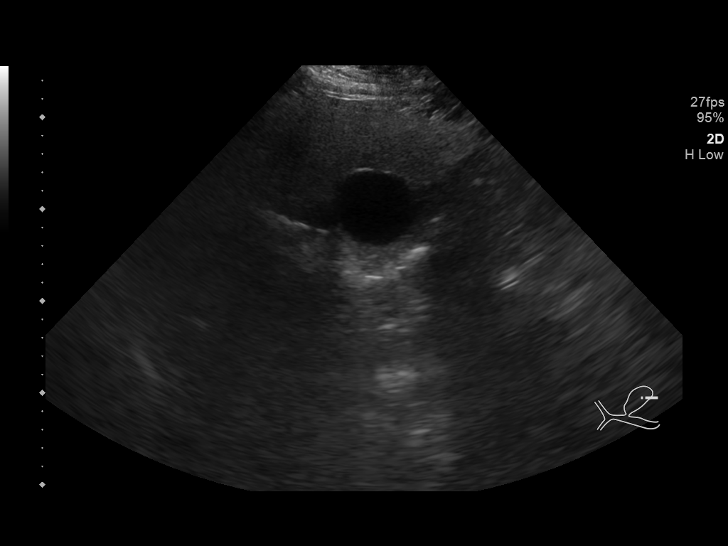
[im 37/110]
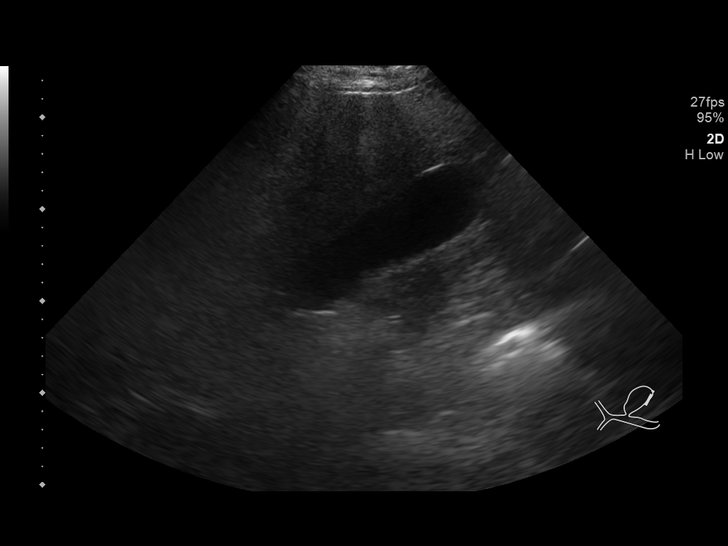
[im 41/110]
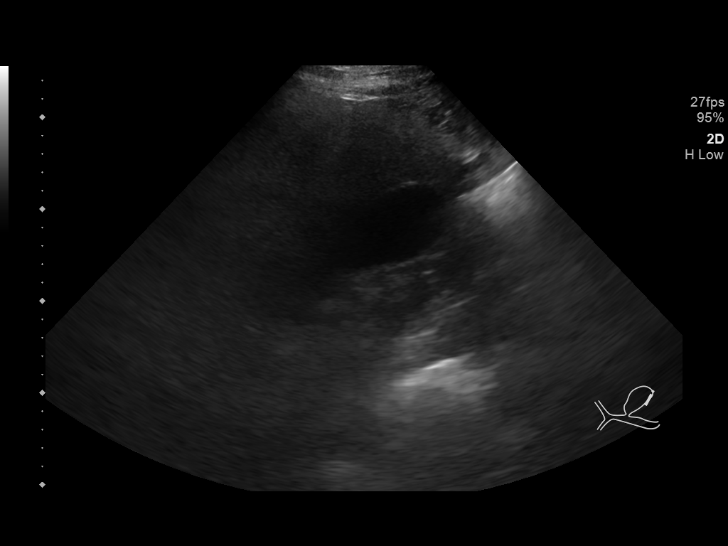
[im 50/110]
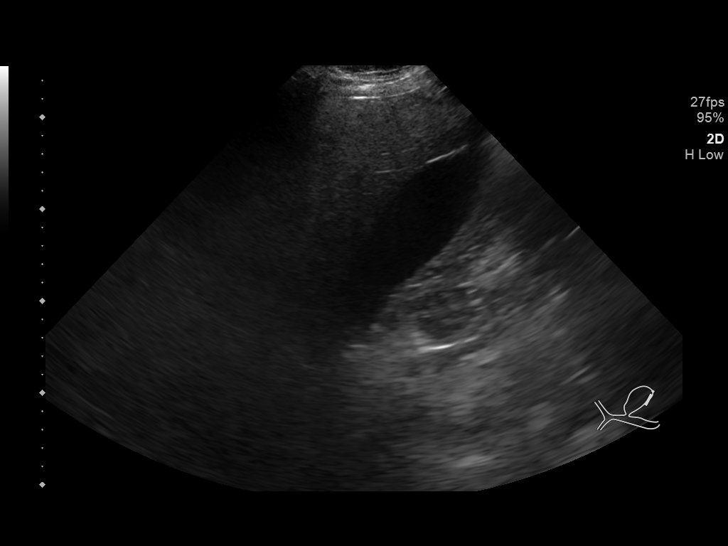
[im 60/110]
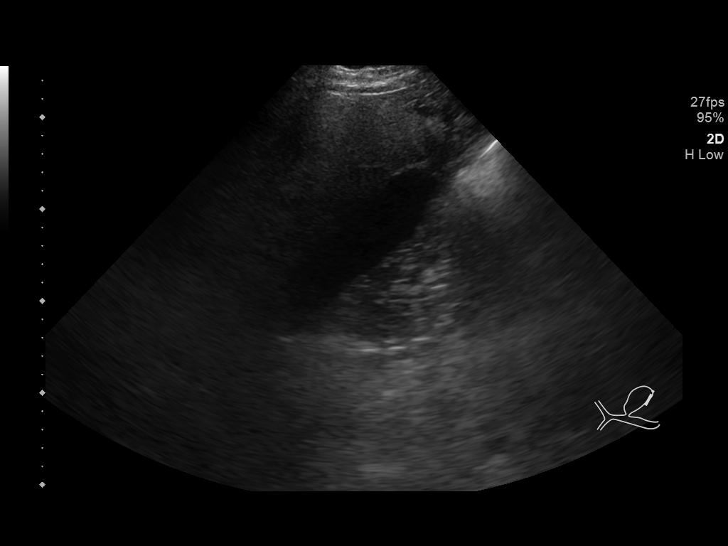
[im 69/110]
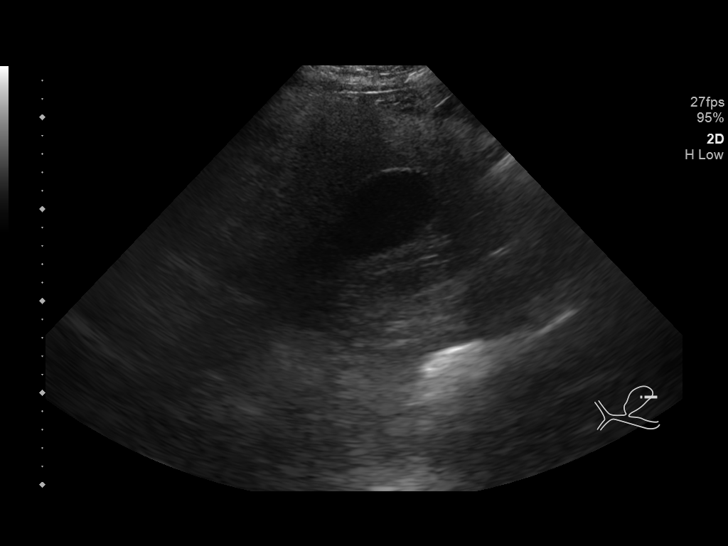
[im 73/110]
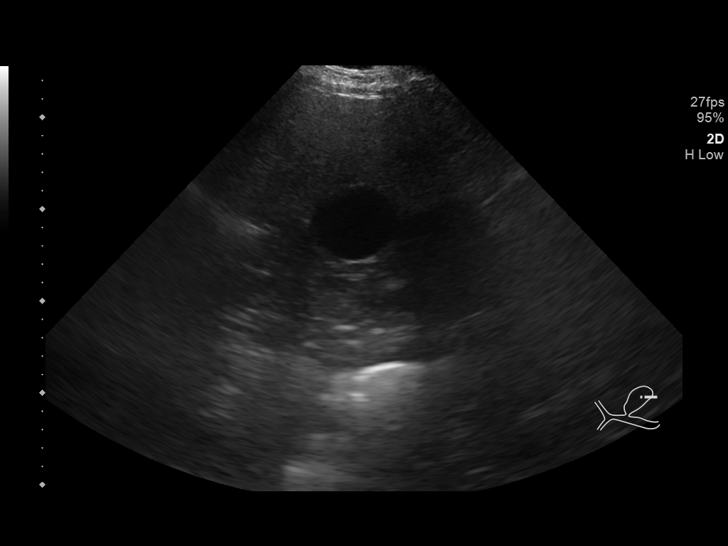
[im 82/110]
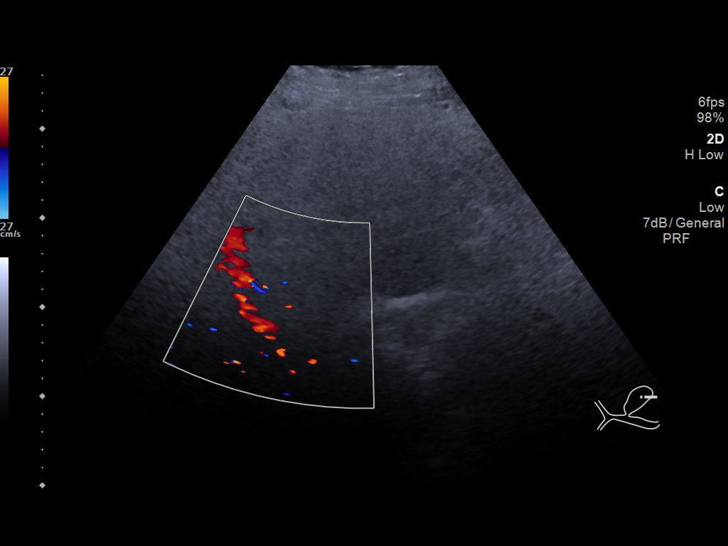
[im 91/110]
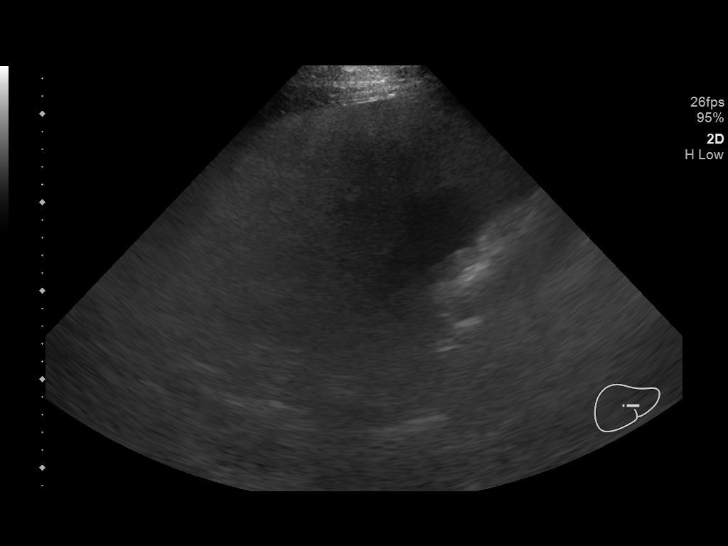
[im 100/110]
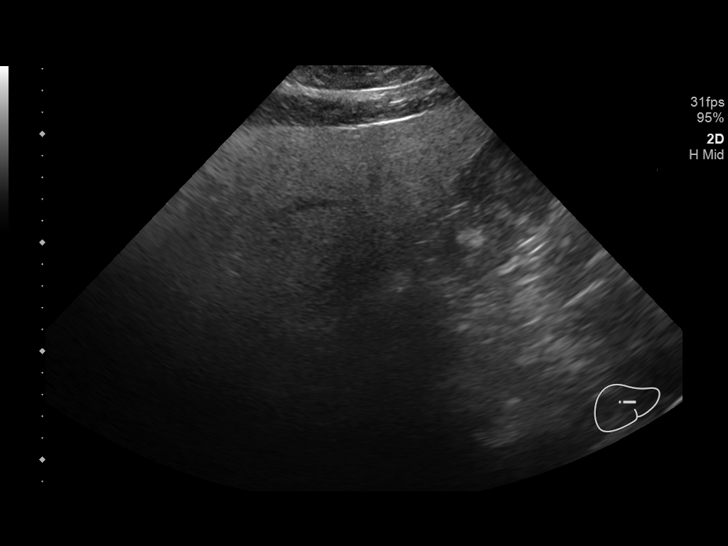
[im 110/110]
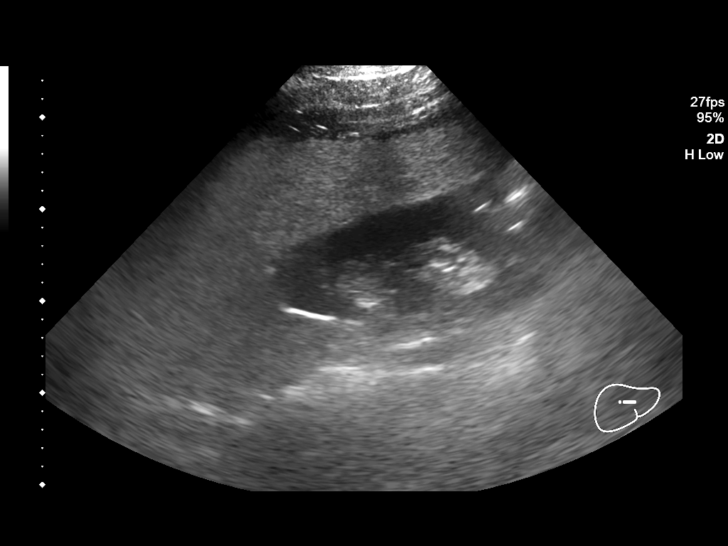

[14 of 25 positions shown; findings below may reference images not displayed]

FINDINGS: Gallbladder:

No gallstones or wall thickening visualized. No sonographic Murphy
sign noted by sonographer.

Common bile duct:

Diameter: 6.5 mm

Liver:

No focal lesion identified. Increased hepatic parenchymal
echogenicity. Portal vein is patent on color Doppler imaging with
normal direction of blood flow towards the liver.
IMPRESSION: 1. No cholelithiasis or sonographic evidence of acute cholecystitis.
2. Increased hepatic echogenicity as can be seen with hepatic
steatosis.

## 2020-07-28 ENCOUNTER — Other Ambulatory Visit: Payer: Self-pay | Admitting: Interventional Cardiology

## 2020-08-06 DIAGNOSIS — I214 Non-ST elevation (NSTEMI) myocardial infarction: Secondary | ICD-10-CM | POA: Insufficient documentation

## 2022-04-15 ENCOUNTER — Ambulatory Visit (HOSPITAL_COMMUNITY)
Admission: EM | Admit: 2022-04-15 | Discharge: 2022-04-15 | Disposition: A | Discharge status: Home / Self Care | Payer: No Typology Code available for payment source

## 2022-04-15 DIAGNOSIS — F10230 Alcohol dependence with withdrawal, uncomplicated: Secondary | ICD-10-CM

## 2022-04-15 NOTE — ED Provider Notes (Signed)
Behavioral Health Urgent Care Medical Screening Exam  Patient Name: Scott Waller MRN: [id number] Date of Evaluation: 04/15/22 Chief Complaint:   Diagnosis:  Final diagnoses:  None    History of Present illness: Scott Waller is a 41 y.o. male. patient presented to [facility] as a walk in accompanied by wife with complaints of alcohol abuse.  He denied that he is seeking for inpatient admission.  States he would like resources to help with his addiction on outpatient patient basis.  States" I cannot be away from my son that long."  Reports drinking beer and taking shots daily.  States his longest period of sobriety when he was hospitalized for heart attack a few months ago.  Denied that he is followed by therapy or psychiatry currently.  Denied suicidal or homicidal ideations.  Denies auditory visual hallucinations.  Denying any symptoms with withdrawal seizures or delirium tremors.  Rathana reports his wife has been supportive as she has attempted to make multiple calls for residential outpatient treatment facilities.  Reports he is followed by pain management due to chronic back pain and numbness and tingling in his extremities.  He denied any other illicit drug use or substance abuse history. "  My drinking started to increase when I got demoted."  States that he now is drinking daily and nightly.  Discussed facility based crisis however patient declined.  Scott Waller, 41 y.o., male patient seen face to face by this provider and chart reviewed on 04/15/22.  On evaluation Scott Waller reports " I am going to try to stop drinking on my own."  Education provided with seeking chemical dependency outpatient programming and/or consider inpatient admission for detox treatment.  Reports he he was told about support group with AA at a local [facility] in his community.  Reports plans to follow-up there first.  During evaluation Scott Waller is sitting in no acute distress.  He is alert/oriented x 4;  calm/cooperative; and mood congruent with affect.  He is speaking in a clear tone at moderate volume, and normal pace; with good eye contact.  His thought process is coherent and relevant; There is no indication that he is currently responding to internal/external stimuli or experiencing delusional thought content; and he has denied suicidal/self-harm/homicidal ideation, psychosis, and paranoia.   Patient has remained calm throughout assessment and has answered questions appropriately.    At this time Scott Waller is educated and verbalizes understanding of mental health resources and other crisis services in the community.  He is instructed to call 911 and present to the nearest emergency room should he experience any suicidal/homicidal ideation, auditory/visual/hallucinations, or detrimental worsening of his mental health condition.  He was a also advised by Clinical research associate that he could call the toll-free phone on insurance card to assist with identifying in network counselors and agencies or number on back of Medicaid card to speak with care coordinator.     Psychiatric Specialty Exam  Presentation  General Appearance:Appropriate for Environment  Eye Contact:Good  Speech:Clear and Coherent  Speech Volume:Normal  Handedness:No data recorded  Mood and Affect  Mood:Depressed  Affect:Congruent   Thought Process  Thought Processes:Coherent  Descriptions of Associations:Intact  Orientation:Full (Time, Place and Person)  Thought Content:Logical    Hallucinations:None  Ideas of Reference:None  Suicidal Thoughts:No  Homicidal Thoughts:No   Sensorium  Memory:Immediate Good; Recent Good  Judgment:Fair  Insight:Fair   Executive Functions  Concentration:Fair  Attention Span:Good  Recall:Good  Fund of Knowledge:Good  Language:Good   Psychomotor Activity  Psychomotor Activity:Normal  Assets  Assets:Desire for Improvement; Social Support   Sleep   Sleep:Fair  Number of hours: No data recorded  Nutritional Assessment (For OBS and FBC admissions only) Has the patient had a weight loss or gain of 10 pounds or more in the last 3 months?: No Has the patient had a decrease in food intake/or appetite?: No Does the patient have dental problems?: No Does the patient have eating habits or behaviors that may be indicators of an eating disorder including binging or inducing vomiting?: No Has the patient recently lost weight without trying?: 0 Has the patient been eating poorly because of a decreased appetite?: 0 Malnutrition Screening Tool Score: 0    Physical Exam: Physical Exam Vitals and nursing note reviewed.  HENT:     Head: Normocephalic.  Eyes:     Pupils: Pupils are equal, round, and reactive to light.  Cardiovascular:     Rate and Rhythm: Normal rate and regular rhythm.  Pulmonary:     Effort: Pulmonary effort is normal.  Musculoskeletal:     Cervical back: Normal range of motion.  Neurological:     General: No focal deficit present.     Mental Status: He is alert and oriented to person, place, and time.  Psychiatric:        Attention and Perception: Attention and perception normal.        Mood and Affect: Mood normal.        Speech: Speech normal.        Behavior: Behavior normal.        Thought Content: Thought content normal.        Cognition and Memory: Cognition and memory normal.        Judgment: Judgment normal.    Review of Systems  Constitutional: Negative.   Eyes: Negative.   Cardiovascular: Negative.   Genitourinary: Negative.   Skin: Negative.   Neurological: Negative.   Endo/Heme/Allergies: Negative.   Psychiatric/Behavioral:  Positive for depression and substance abuse. Negative for suicidal ideas. The patient is nervous/anxious.   All other systems reviewed and are negative.  Blood pressure (!) 119/56, pulse 65, temperature 98.3 F (36.8 C), temperature source Oral, resp. rate 19, SpO2 98 %.  There is no height or weight on file to calculate BMI.  Musculoskeletal: Strength & Muscle Tone: within normal limits Gait & Station: normal Patient leans: N/A   [facility] MSE Discharge Disposition for Follow up and Recommendations: Based on my evaluation the patient does not appear to have an emergency medical condition and can be discharged with resources and follow up care in outpatient services for Medication Management and Substance Abuse Intensive Outpatient Program  We will make additional outpatient resources available for alcohol and drug services [facility] and residential treatment facility patient and family was receptive to plan.   Oneta Rack, NP 04/15/2022, 12:40 PM

## 2022-04-15 NOTE — Progress Notes (Signed)
   04/15/22 1254  [facility] Triage Screening (Walk-ins at [facility] only)  How Did You Hear About [place]? Self  What Is the Reason for Your Visit/Call Today? Patient presents requesting assistance with SA treatment options.  He reports drinking every evening for the past 9 years, however he has been drinking throughout the day for the past month.  He was only drinking beer, however over he past 4 years he has added liquor.  He reports the past month has been the worst, after he went through a job transition.  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  Patient states he wants to try to taper on his own,  as he does not want to be in a program and away from his 43 y.o. son.  Patient believes if he were connected with outpatient treatment, substance counseling or CD-IOP, he could stop drinking during the day.  He also plans on attending AA groups.  Another barrier to detox is patient is on opiate pain medication for psoriatic arthritis.  He is concerned he would struggle too much without the pain medication.  He plans to taper alcohol intake on his own while engaging in outpatient SA treatment.  Patient denies SI, HI and AVH.  Outpatient SA resources have been provided.  How Long Has This Been Causing You Problems? > than 6 months  Have You Recently Had Any Thoughts About Hurting Yourself? No  Are You Planning to Commit Suicide/Harm Yourself At This time? No  Have you Recently Had Thoughts About Hurting Someone [name]? No  Are You Planning To Harm Someone At This Time? No  Are you currently experiencing any auditory, visual or other hallucinations? No  Have You Used Any Alcohol or Drugs in the Past 24 Hours? Yes  How long ago did you use Drugs or Alcohol? this morning  What Did You Use and How Much? 1 shot of liquor  Do you have any current medical co-morbidities that require immediate attention? Yes  Please describe current medical co-morbidities  that require immediate attention: psoriatic arthritis, treated by neuro, on pain meds  Clinician description of patient physical appearance/behavior: Patient is calm, cooperative, tearful at times.  AAOx5  What Do You Feel Would Help You the Most Today? Alcohol or Drug Use Treatment  If access to [facility] was not available, would you have sought care in the Emergency Department? No  Determination of Need Routine (7 days)  Options For Referral Chemical Dependency Intensive Outpatient Therapy (CDIOP)

## 2022-04-15 NOTE — Discharge Instructions (Signed)
Take all medications as prescribed. Keep all follow-up appointments as scheduled.  Do not consume alcohol or use illegal drugs while on prescription medications. Report any adverse effects from your medications to your primary care provider promptly.  In the event of recurrent symptoms or worsening symptoms, call 911, a crisis hotline, or go to the nearest emergency department for evaluation.   

## 2022-04-19 ENCOUNTER — Encounter (HOSPITAL_COMMUNITY): Payer: Self-pay

## 2022-04-19 ENCOUNTER — Other Ambulatory Visit: Payer: Self-pay

## 2022-04-19 ENCOUNTER — Inpatient Hospital Stay (HOSPITAL_COMMUNITY)
Admission: EM | Admit: 2022-04-19 | Discharge: 2022-04-22 | DRG: 683 | Disposition: A | Discharge status: Home / Self Care | Payer: Self-pay | Source: Ambulatory Visit | Attending: Internal Medicine | Admitting: Internal Medicine

## 2022-04-19 ENCOUNTER — Emergency Department (HOSPITAL_COMMUNITY): Payer: Self-pay

## 2022-04-19 DIAGNOSIS — I959 Hypotension, unspecified: Secondary | ICD-10-CM

## 2022-04-19 DIAGNOSIS — E860 Dehydration: Secondary | ICD-10-CM | POA: Diagnosis present

## 2022-04-19 DIAGNOSIS — F419 Anxiety disorder, unspecified: Secondary | ICD-10-CM | POA: Diagnosis present

## 2022-04-19 DIAGNOSIS — I252 Old myocardial infarction: Secondary | ICD-10-CM

## 2022-04-19 DIAGNOSIS — E785 Hyperlipidemia, unspecified: Secondary | ICD-10-CM | POA: Diagnosis present

## 2022-04-19 DIAGNOSIS — Z7902 Long term (current) use of antithrombotics/antiplatelets: Secondary | ICD-10-CM

## 2022-04-19 DIAGNOSIS — N179 Acute kidney failure, unspecified: Principal | ICD-10-CM | POA: Diagnosis present

## 2022-04-19 DIAGNOSIS — F32A Depression, unspecified: Secondary | ICD-10-CM | POA: Diagnosis present

## 2022-04-19 DIAGNOSIS — F19939 Other psychoactive substance use, unspecified with withdrawal, unspecified: Secondary | ICD-10-CM | POA: Diagnosis not present

## 2022-04-19 DIAGNOSIS — Z6841 Body Mass Index (BMI) 40.0 and over, adult: Secondary | ICD-10-CM

## 2022-04-19 DIAGNOSIS — Z79620 Long term (current) use of immunosuppressive biologic: Secondary | ICD-10-CM

## 2022-04-19 DIAGNOSIS — J45909 Unspecified asthma, uncomplicated: Secondary | ICD-10-CM | POA: Diagnosis present

## 2022-04-19 DIAGNOSIS — E878 Other disorders of electrolyte and fluid balance, not elsewhere classified: Secondary | ICD-10-CM | POA: Diagnosis present

## 2022-04-19 DIAGNOSIS — L4050 Arthropathic psoriasis, unspecified: Secondary | ICD-10-CM | POA: Diagnosis present

## 2022-04-19 DIAGNOSIS — Z7951 Long term (current) use of inhaled steroids: Secondary | ICD-10-CM

## 2022-04-19 DIAGNOSIS — E8809 Other disorders of plasma-protein metabolism, not elsewhere classified: Secondary | ICD-10-CM | POA: Diagnosis present

## 2022-04-19 DIAGNOSIS — K219 Gastro-esophageal reflux disease without esophagitis: Secondary | ICD-10-CM | POA: Diagnosis present

## 2022-04-19 DIAGNOSIS — F10130 Alcohol abuse with withdrawal, uncomplicated: Secondary | ICD-10-CM | POA: Insufficient documentation

## 2022-04-19 DIAGNOSIS — M1990 Unspecified osteoarthritis, unspecified site: Secondary | ICD-10-CM | POA: Diagnosis present

## 2022-04-19 DIAGNOSIS — I10 Essential (primary) hypertension: Secondary | ICD-10-CM | POA: Diagnosis present

## 2022-04-19 DIAGNOSIS — F411 Generalized anxiety disorder: Secondary | ICD-10-CM | POA: Diagnosis present

## 2022-04-19 DIAGNOSIS — F10139 Alcohol abuse with withdrawal, unspecified: Secondary | ICD-10-CM | POA: Diagnosis present

## 2022-04-19 DIAGNOSIS — K7010 Alcoholic hepatitis without ascites: Secondary | ICD-10-CM | POA: Diagnosis present

## 2022-04-19 DIAGNOSIS — R600 Localized edema: Secondary | ICD-10-CM | POA: Diagnosis present

## 2022-04-19 DIAGNOSIS — Z8249 Family history of ischemic heart disease and other diseases of the circulatory system: Secondary | ICD-10-CM

## 2022-04-19 DIAGNOSIS — Y900 Blood alcohol level of less than 20 mg/100 ml: Secondary | ICD-10-CM | POA: Diagnosis present

## 2022-04-19 DIAGNOSIS — M549 Dorsalgia, unspecified: Secondary | ICD-10-CM | POA: Diagnosis present

## 2022-04-19 DIAGNOSIS — Z7982 Long term (current) use of aspirin: Secondary | ICD-10-CM

## 2022-04-19 DIAGNOSIS — D6959 Other secondary thrombocytopenia: Secondary | ICD-10-CM | POA: Diagnosis present

## 2022-04-19 DIAGNOSIS — Z720 Tobacco use: Secondary | ICD-10-CM | POA: Diagnosis present

## 2022-04-19 DIAGNOSIS — F17210 Nicotine dependence, cigarettes, uncomplicated: Secondary | ICD-10-CM | POA: Diagnosis present

## 2022-04-19 DIAGNOSIS — Z888 Allergy status to other drugs, medicaments and biological substances status: Secondary | ICD-10-CM

## 2022-04-19 DIAGNOSIS — Z79899 Other long term (current) drug therapy: Secondary | ICD-10-CM

## 2022-04-19 DIAGNOSIS — E872 Acidosis, unspecified: Secondary | ICD-10-CM | POA: Diagnosis present

## 2022-04-19 DIAGNOSIS — N170 Acute kidney failure with tubular necrosis: Principal | ICD-10-CM | POA: Diagnosis present

## 2022-04-19 DIAGNOSIS — G8929 Other chronic pain: Secondary | ICD-10-CM

## 2022-04-19 DIAGNOSIS — I2510 Atherosclerotic heart disease of native coronary artery without angina pectoris: Secondary | ICD-10-CM | POA: Diagnosis present

## 2022-04-19 DIAGNOSIS — R7401 Elevation of levels of liver transaminase levels: Secondary | ICD-10-CM | POA: Diagnosis present

## 2022-04-19 DIAGNOSIS — Z79891 Long term (current) use of opiate analgesic: Secondary | ICD-10-CM

## 2022-04-19 DIAGNOSIS — E871 Hypo-osmolality and hyponatremia: Secondary | ICD-10-CM | POA: Diagnosis present

## 2022-04-19 LAB — CBC WITH DIFFERENTIAL/PLATELET
Abs Immature Granulocytes: 0.02 10*3/uL (ref 0.00–0.07)
Basophils Absolute: 0 10*3/uL (ref 0.0–0.1)
Basophils Relative: 1 %
Eosinophils Absolute: 0 10*3/uL (ref 0.0–0.5)
Eosinophils Relative: 0 %
HCT: 36.4 % — ABNORMAL LOW (ref 39.0–52.0)
Hemoglobin: 12.3 g/dL — ABNORMAL LOW (ref 13.0–17.0)
Immature Granulocytes: 0 %
Lymphocytes Relative: 12 %
Lymphs Abs: 0.6 10*3/uL — ABNORMAL LOW (ref 0.7–4.0)
MCH: 34.7 pg — ABNORMAL HIGH (ref 26.0–34.0)
MCHC: 33.8 g/dL (ref 30.0–36.0)
MCV: 102.8 fL — ABNORMAL HIGH (ref 80.0–100.0)
Monocytes Absolute: 0.4 10*3/uL (ref 0.1–1.0)
Monocytes Relative: 8 %
Neutro Abs: 3.9 10*3/uL (ref 1.7–7.7)
Neutrophils Relative %: 79 %
Platelets: 118 10*3/uL — ABNORMAL LOW (ref 150–400)
RBC: 3.54 MIL/uL — ABNORMAL LOW (ref 4.22–5.81)
RDW: 15.4 % (ref 11.5–15.5)
WBC: 4.9 10*3/uL (ref 4.0–10.5)
nRBC: 0 % (ref 0.0–0.2)

## 2022-04-19 LAB — LACTIC ACID, PLASMA
Lactic Acid, Venous: 3.6 mmol/L (ref 0.5–1.9)
Lactic Acid, Venous: 1.2 mmol/L (ref 0.5–1.9)

## 2022-04-19 LAB — COMPREHENSIVE METABOLIC PANEL
ALT: 89 U/L — ABNORMAL HIGH (ref 0–44)
AST: 80 U/L — ABNORMAL HIGH (ref 15–41)
Albumin: 3.1 g/dL — ABNORMAL LOW (ref 3.5–5.0)
Alkaline Phosphatase: 65 U/L (ref 38–126)
Anion gap: 25 — ABNORMAL HIGH (ref 5–15)
BUN: 27 mg/dL — ABNORMAL HIGH (ref 6–20)
CO2: 12 mmol/L — ABNORMAL LOW (ref 22–32)
Calcium: 8 mg/dL — ABNORMAL LOW (ref 8.9–10.3)
Chloride: 93 mmol/L — ABNORMAL LOW (ref 98–111)
Creatinine, Ser: 5.22 mg/dL — ABNORMAL HIGH (ref 0.61–1.24)
GFR, Estimated: 13 mL/min — ABNORMAL LOW (ref >60)
Glucose, Bld: 81 mg/dL (ref 70–99)
Potassium: 4.2 mmol/L (ref 3.5–5.1)
Sodium: 130 mmol/L — ABNORMAL LOW (ref 135–145)
Total Bilirubin: 1.7 mg/dL — ABNORMAL HIGH (ref 0.3–1.2)
Total Protein: 6.3 g/dL — ABNORMAL LOW (ref 6.5–8.1)

## 2022-04-19 LAB — URINALYSIS, ROUTINE W REFLEX MICROSCOPIC
Bilirubin Urine: NEGATIVE
Glucose, UA: NEGATIVE mg/dL
Ketones, ur: 5 mg/dL — AB
Leukocytes,Ua: NEGATIVE
Nitrite: NEGATIVE
Protein, ur: 100 mg/dL — AB
Specific Gravity, Urine: 1.019 (ref 1.005–1.030)
pH: 5 (ref 5.0–8.0)

## 2022-04-19 LAB — ETHANOL: Alcohol, Ethyl (B): 14 mg/dL — ABNORMAL HIGH (ref <10)

## 2022-04-19 LAB — BRAIN NATRIURETIC PEPTIDE: B Natriuretic Peptide: 52.9 pg/mL (ref 0.0–100.0)

## 2022-04-19 MED ORDER — SODIUM CHLORIDE 0.9 % IV BOLUS
1000.0000 mL | Freq: Once | INTRAVENOUS | Status: AC
  Administered 2022-04-19: 1000 mL via INTRAVENOUS

## 2022-04-19 MED ORDER — LORAZEPAM 2 MG/ML IJ SOLN
0.5000 mg | Freq: Once | INTRAMUSCULAR | Status: AC
  Administered 2022-04-19: 0.5 mg via INTRAVENOUS
  Filled 2022-04-19: qty 1

## 2022-04-19 MED ORDER — LACTATED RINGERS IV BOLUS
1000.0000 mL | Freq: Once | INTRAVENOUS | Status: AC
  Administered 2022-04-19: 1000 mL via INTRAVENOUS

## 2022-04-19 MED ORDER — ACETAMINOPHEN 325 MG PO TABS
650.0000 mg | ORAL_TABLET | Freq: Once | ORAL | Status: AC
  Administered 2022-04-19: 650 mg via ORAL
  Filled 2022-04-19: qty 2

## 2022-04-19 NOTE — ED Provider Notes (Signed)
[facility] EMERGENCY DEPARTMENT Provider Note   CSN: [id number] Arrival date & time: 04/19/22  1807     History  Chief Complaint  Patient presents with   ETOH withdraw   Hypotension    Scott Waller is a 41 y.o. male.  HPI Patient presents with dizziness.  Sent from PCP with hypotension.  Has a history of heavy alcohol use.  Drinks about 1/5 a day.  Did not drink yesterday but did drink some today.  Pressures in the 70s for them.  Does have peripheral edema.  On July 3 he did have a change in his blood pressure medicines.  No nausea or vomiting.  No dysuria.  May be urinating less per the patient's wife.  He does have chronic back pain.  No real abdominal pain.   Past Medical History:  Diagnosis Date   Anxiety and depression    Arthritis    Asthma    Bronchitis    CAD (coronary artery disease)    a. 10/15/2018 DES to prox LAD   GERD (gastroesophageal reflux disease)    History of atrial fibrillation    During alcohol binge   Hyperlipidemia with target LDL less than 70    Hypertension    Tobacco abuse     Home Medications Prior to Admission medications   Medication Sig Start Date End Date Taking? Authorizing Provider  albuterol (PROVENTIL) (2.5 MG/3ML) 0.083% nebulizer solution Take 3 mLs (2.5 mg total) by nebulization every 4 (four) hours as needed for wheezing or shortness of breath. 09/28/17   [name], MD  ALPRAZolam Prudy Feeler) 0.5 MG tablet Take 0.5 mg by mouth 3 (three) times daily as needed for anxiety.  11/07/16   [provider]  aspirin 81 MG chewable tablet Chew 1 tablet (81 mg total) by mouth daily. 10/16/18   Berton Bon, NP  BREO ELLIPTA 100-25 MCG/INH AEPB Take 1 puff by mouth daily. 07/26/18   [provider]  CARAFATE 1 GM/10ML suspension Take 10 mLs by mouth 4 (four) times daily -  with meals and at bedtime. 08/05/18   [provider]  Clobetasol Propionate (TEMOVATE) 0.05 % external spray Apply 1  application topically 2 (two) times daily as needed. 08/12/18   [provider]  clopidogrel (PLAVIX) 75 MG tablet Take 1 tablet (75 mg total) by mouth daily. Please make overdue appt with Dr. Eldridge Dace before anymore refills. Thank you 1st attempt 07/29/20   Corky Crafts, MD  COSENTYX SENSOREADY, 300 MG, 150 MG/ML SOAJ Inject 150 mg into the skin every 30 (thirty) days. 07/29/18   [provider]  fluticasone (FLONASE) 50 MCG/ACT nasal spray Place 2 sprays into both nostrils daily. 08/10/18   [provider]  HYDROcodone-acetaminophen (NORCO) 10-325 MG tablet Take 1 tablet by mouth 3 (three) times daily.  11/07/16   [provider]  lamoTRIgine (LAMICTAL) 25 MG tablet Take 50 mg by mouth daily.    [provider]  methocarbamol (ROBAXIN) 500 MG tablet Take 1 tablet (500 mg total) by mouth 2 (two) times daily. 08/14/18   [name], [name], PA-C  metoprolol tartrate (LOPRESSOR) 25 MG tablet Take 1 tablet (25 mg total) by mouth 2 (two) times daily. 10/16/18   Berton Bon, NP  Multiple Vitamins-Minerals (MULTIVITAMIN MEN PO) Take 1 tablet by mouth daily.    [provider]  nicotine (NICODERM CQ - DOSED IN MG/24 HOURS) 21 mg/24hr patch Place 1 patch (21 mg total) onto the skin daily. 10/16/18  Berton Bon, NP  nitroGLYCERIN (NITROSTAT) 0.4 MG SL tablet Place 0.4 mg under the tongue every 5 (five) minutes x 3 doses as needed. If no improvement, call 911 08/08/18   [provider]  ondansetron (ZOFRAN-ODT) 8 MG disintegrating tablet Take 1 tablet (8 mg total) by mouth every 8 (eight) hours as needed for nausea or vomiting. 08/14/18   [name], [name], PA-C  rosuvastatin (CRESTOR) 20 MG tablet Take 10 mg by mouth daily.    [provider]  traZODone (DESYREL) 150 MG tablet Take 150 mg by mouth at bedtime.    [provider]  valsartan-hydrochlorothiazide (DIOVAN-HCT) 320-25 MG tablet Take 1 tablet by mouth daily. 06/10/18    [provider]      Allergies    Desvenlafaxine    Review of Systems   Review of Systems  Physical Exam Updated Vital Signs BP (!) 126/47   Pulse 95   Temp 97.9 F (36.6 C) (Oral)   Resp 12   SpO2 90%  Physical Exam Vitals and nursing note reviewed.  HENT:     Head: Normocephalic.  Eyes:     Pupils: Pupils are equal, round, and reactive to light.  Cardiovascular:     Rate and Rhythm: Regular rhythm.  Abdominal:     Comments: Skin changes in periumbilical/anterior abdomen.  Some scaling of skin.  Musculoskeletal:     Cervical back: Neck supple.     Right lower leg: Edema present.     Left lower leg: Edema present.     Comments: Lumbar tenderness.  Moderate pitting edema bilateral lower extremities.  Skin:    Capillary Refill: Capillary refill takes less than 2 seconds.  Neurological:     Mental Status: He is alert and oriented to person, place, and time.     ED Results / Procedures / Treatments   Labs (all labs ordered are listed, but only abnormal results are displayed) Labs Reviewed  COMPREHENSIVE METABOLIC PANEL - Abnormal; Notable for the following components:      Result Value   Sodium 130 (*)    Chloride 93 (*)    CO2 12 (*)    BUN 27 (*)    Creatinine, Ser 5.22 (*)    Calcium 8.0 (*)    Total Protein 6.3 (*)    Albumin 3.1 (*)    AST 80 (*)    ALT 89 (*)    Total Bilirubin 1.7 (*)    GFR, Estimated 13 (*)    Anion gap 25 (*)    All other components within normal limits  LACTIC ACID, PLASMA - Abnormal; Notable for the following components:   Lactic Acid, Venous 3.6 (*)    All other components within normal limits  CBC WITH DIFFERENTIAL/PLATELET - Abnormal; Notable for the following components:   RBC 3.54 (*)    Hemoglobin 12.3 (*)    HCT 36.4 (*)    MCV 102.8 (*)    MCH 34.7 (*)    Platelets 118 (*)    Lymphs Abs 0.6 (*)    All other components within normal limits  ETHANOL - Abnormal; Notable for the following components:    Alcohol, Ethyl (B) 14 (*)    All other components within normal limits  URINALYSIS, ROUTINE W REFLEX MICROSCOPIC - Abnormal; Notable for the following components:   Color, Urine AMBER (*)    APPearance CLOUDY (*)    Hgb urine dipstick MODERATE (*)    Ketones, ur 5 (*)  Protein, ur 100 (*)    Bacteria, UA FEW (*)    All other components within normal limits  CULTURE, BLOOD (ROUTINE X 2)  CULTURE, BLOOD (ROUTINE X 2)  LACTIC ACID, PLASMA  BRAIN NATRIURETIC PEPTIDE    EKG EKG Interpretation  Date/Time:  Wednesday April 19 2022 18:17:16 EDT Ventricular Rate:  101 PR Interval:  191 QRS Duration: 98 QT Interval:  342 QTC Calculation: 444 R Axis:   -80 Text Interpretation: Sinus tachycardia LAD, consider left anterior fascicular block RSR' in V1 or V2, probably normal variant Confirmed by Benjiman Core [phone]) on 04/19/2022 9:10:26 PM  Radiology DG Chest Portable 1 View  Result Date: 04/19/2022 CLINICAL DATA:  Weakness EXAM: PORTABLE CHEST 1 VIEW COMPARISON:  10/13/2018 FINDINGS: The heart size and mediastinal contours are within normal limits. Both lungs are clear. The visualized skeletal structures are unremarkable. IMPRESSION: No active disease. Electronically Signed   By: Deatra Robinson M.D.   On: 04/19/2022 21:16    Procedures Procedures    Medications Ordered in ED Medications  sodium chloride 0.9 % bolus 1,000 mL (0 mLs Intravenous Stopped 04/19/22 2128)  lactated ringers bolus 1,000 mL (1,000 mLs Intravenous New Bag/Given 04/19/22 2128)  acetaminophen (TYLENOL) tablet 650 mg (650 mg Oral Given 04/19/22 2146)  LORazepam (ATIVAN) injection 0.5 mg (0.5 mg Intravenous Given 04/19/22 2147)    ED Course/ Medical Decision Making/ A&P                           Medical Decision Making Amount and/or Complexity of Data Reviewed Labs: ordered. Radiology: ordered.  Risk OTC drugs. Prescription drug management.   Patient with hypotension.  Differential diagnosis is long  and includes life-threatening conditions such as dehydration, cardiac failure, infection. He is not febrile and does not have an elevated white count which makes infection less likely.  However does have a new renal failure.  Does have recent change in his medications.  However does have peripheral edema.  Did not have retained urine on bladder scan.  Fluid boluses given.  Discussed with ICU pressure was initially not responsive but has since improved some after fluid boluses.  Chest x-ray independently interpreted and showed no volume overload or infection.  Will require admission to the hospital.  Discussed with Dr. [name]. He also is a heavy drinker and is likely at risk for alcohol withdrawal is in the hospital.  CRITICAL CARE Performed by: Benjiman Core Total critical care time: 30 minutes Critical care time was exclusive of separately billable procedures and treating other patients. Critical care was necessary to treat or prevent imminent or life-threatening deterioration. Critical care was time spent personally by me on the following activities: development of treatment plan with patient and/or surrogate as well as nursing, discussions with consultants, evaluation of patient's response to treatment, examination of patient, obtaining history from patient or surrogate, ordering and performing treatments and interventions, ordering and review of laboratory studies, ordering and review of radiographic studies, pulse oximetry and re-evaluation of patient's condition.         Final Clinical Impression(s) / ED Diagnoses Final diagnoses:  AKI (acute kidney injury) (HCC)  Hypotension, unspecified hypotension type    Rx / DC Orders ED Discharge Orders     None         Benjiman Core, MD 04/19/22 2327

## 2022-04-19 NOTE — Subjective & Objective (Signed)
Patient was brought in initially with hypotension BP 70s over 45 given 700 and milliliters normal saline his blood pressure improved to 98/62 Patient has been a heavy alcohol drinker and try to quit on his own for the past 2 weeks But did have alcohol this morning He usually is a very heavy drinker of beer and liquor and for the past month has been daily drinking throughout the day Last time he was sober was when he was hospitalized for heart attack This morning had but half of 1/5 of vodka

## 2022-04-19 NOTE — ED Notes (Signed)
Pt states that he had almost half a fifth of vodka this morning.

## 2022-04-19 NOTE — ED Triage Notes (Signed)
Pt BIB [facility] from his PCP d/t hypotension, reported that he was 70/45 with pulse of 100 bpm. EMS arrived & stated all other VSS & after 700 cc NS in a 22g PIV in Rt AC his pressure went to 98/62 & pulse was 80 bpm. 98% RA & GCS 15. Pt also reports that he has been trying to quit drinking alcohol on his own for about 2 weeks now & did have ETOH this morning.

## 2022-04-20 ENCOUNTER — Inpatient Hospital Stay (HOSPITAL_COMMUNITY): Payer: Self-pay

## 2022-04-20 DIAGNOSIS — G8929 Other chronic pain: Secondary | ICD-10-CM

## 2022-04-20 DIAGNOSIS — I25118 Atherosclerotic heart disease of native coronary artery with other forms of angina pectoris: Secondary | ICD-10-CM

## 2022-04-20 DIAGNOSIS — F10130 Alcohol abuse with withdrawal, uncomplicated: Secondary | ICD-10-CM | POA: Insufficient documentation

## 2022-04-20 DIAGNOSIS — N179 Acute kidney failure, unspecified: Secondary | ICD-10-CM | POA: Diagnosis present

## 2022-04-20 DIAGNOSIS — I952 Hypotension due to drugs: Secondary | ICD-10-CM

## 2022-04-20 DIAGNOSIS — Z720 Tobacco use: Secondary | ICD-10-CM

## 2022-04-20 DIAGNOSIS — I959 Hypotension, unspecified: Secondary | ICD-10-CM

## 2022-04-20 DIAGNOSIS — Z6841 Body Mass Index (BMI) 40.0 and over, adult: Secondary | ICD-10-CM

## 2022-04-20 DIAGNOSIS — I10 Essential (primary) hypertension: Secondary | ICD-10-CM

## 2022-04-20 DIAGNOSIS — L4050 Arthropathic psoriasis, unspecified: Secondary | ICD-10-CM

## 2022-04-20 LAB — OSMOLALITY, URINE: Osmolality, Ur: 235 mOsm/kg — ABNORMAL LOW (ref 300–900)

## 2022-04-20 LAB — BLOOD CULTURE ID PANEL (REFLEXED) - BCID2

## 2022-04-20 LAB — COMPREHENSIVE METABOLIC PANEL
ALT: 86 U/L — ABNORMAL HIGH (ref 0–44)
AST: 73 U/L — ABNORMAL HIGH (ref 15–41)
Albumin: 2.9 g/dL — ABNORMAL LOW (ref 3.5–5.0)
Alkaline Phosphatase: 57 U/L (ref 38–126)
Anion gap: 15 (ref 5–15)
BUN: 27 mg/dL — ABNORMAL HIGH (ref 6–20)
CO2: 19 mmol/L — ABNORMAL LOW (ref 22–32)
Calcium: 8.1 mg/dL — ABNORMAL LOW (ref 8.9–10.3)
Chloride: 94 mmol/L — ABNORMAL LOW (ref 98–111)
Creatinine, Ser: 4.5 mg/dL — ABNORMAL HIGH (ref 0.61–1.24)
GFR, Estimated: 16 mL/min — ABNORMAL LOW (ref >60)
Glucose, Bld: 91 mg/dL (ref 70–99)
Potassium: 4.1 mmol/L (ref 3.5–5.1)
Sodium: 128 mmol/L — ABNORMAL LOW (ref 135–145)
Total Bilirubin: 1.5 mg/dL — ABNORMAL HIGH (ref 0.3–1.2)
Total Protein: 5.7 g/dL — ABNORMAL LOW (ref 6.5–8.1)

## 2022-04-20 LAB — TSH: TSH: 3.197 u[IU]/mL (ref 0.350–4.500)

## 2022-04-20 LAB — CREATININE, URINE, RANDOM: Creatinine, Urine: 81 mg/dL

## 2022-04-20 LAB — HIV ANTIBODY (ROUTINE TESTING W REFLEX): HIV Screen 4th Generation wRfx: NONREACTIVE

## 2022-04-20 LAB — SODIUM, URINE, RANDOM: Sodium, Ur: 61 mmol/L

## 2022-04-20 LAB — MAGNESIUM: Magnesium: 1.4 mg/dL — ABNORMAL LOW (ref 1.7–2.4)

## 2022-04-20 LAB — OSMOLALITY: Osmolality: 278 mOsm/kg (ref 275–295)

## 2022-04-20 MED ORDER — CHLORDIAZEPOXIDE HCL 5 MG PO CAPS: 25.0000 mg | ORAL_CAPSULE | Freq: Four times a day (QID) | ORAL | Status: DC | PRN

## 2022-04-20 MED ORDER — MELATONIN 5 MG PO TABS
10.0000 mg | ORAL_TABLET | Freq: Every evening | ORAL | Status: DC | PRN
  Administered 2022-04-20: 10 mg via ORAL
  Filled 2022-04-20: qty 2

## 2022-04-20 MED ORDER — ACETAMINOPHEN 325 MG PO TABS
650.0000 mg | ORAL_TABLET | Freq: Four times a day (QID) | ORAL | Status: DC | PRN
  Administered 2022-04-20 – 2022-04-21 (×2): 650 mg via ORAL
  Filled 2022-04-20 (×2): qty 2

## 2022-04-20 MED ORDER — ALBUMIN HUMAN 25 % IV SOLN
25.0000 g | Freq: Once | INTRAVENOUS | Status: AC
Start: 2022-04-20 — End: 2022-04-21
  Administered 2022-04-20: 25 g via INTRAVENOUS
  Filled 2022-04-20: qty 100

## 2022-04-20 MED ORDER — HEPARIN SODIUM (PORCINE) 5000 UNIT/ML IJ SOLN
5000.0000 [IU] | Freq: Three times a day (TID) | INTRAMUSCULAR | Status: DC
  Administered 2022-04-20 – 2022-04-22 (×6): 5000 [IU] via SUBCUTANEOUS
  Filled 2022-04-20 (×6): qty 1

## 2022-04-20 MED ORDER — THIAMINE HCL 100 MG/ML IJ SOLN
100.0000 mg | Freq: Every day | INTRAMUSCULAR | Status: DC
Start: 2022-04-20 — End: 2022-04-22

## 2022-04-20 MED ORDER — FOLIC ACID 1 MG PO TABS
1.0000 mg | ORAL_TABLET | Freq: Every day | ORAL | Status: DC
  Administered 2022-04-20 – 2022-04-22 (×3): 1 mg via ORAL
  Filled 2022-04-20 (×3): qty 1

## 2022-04-20 MED ORDER — ADULT MULTIVITAMIN W/MINERALS CH: 1.0000 | ORAL_TABLET | Freq: Every day | ORAL | Status: DC

## 2022-04-20 MED ORDER — ONDANSETRON HCL 4 MG PO TABS: 4.0000 mg | ORAL_TABLET | Freq: Four times a day (QID) | ORAL | Status: DC | PRN

## 2022-04-20 MED ORDER — ONDANSETRON 4 MG PO TBDP
4.0000 mg | ORAL_TABLET | Freq: Four times a day (QID) | ORAL | Status: DC | PRN
Start: 2022-04-20 — End: 2022-04-22

## 2022-04-20 MED ORDER — ONDANSETRON HCL 4 MG/2ML IJ SOLN: 4.0000 mg | Freq: Four times a day (QID) | INTRAMUSCULAR | Status: DC | PRN

## 2022-04-20 MED ORDER — CHLORDIAZEPOXIDE HCL 5 MG PO CAPS: 25.0000 mg | ORAL_CAPSULE | Freq: Every day | ORAL | Status: DC

## 2022-04-20 MED ORDER — THIAMINE HCL 100 MG PO TABS: 100.0000 mg | ORAL_TABLET | Freq: Every day | ORAL | Status: DC

## 2022-04-20 MED ORDER — HYDROMORPHONE HCL 2 MG PO TABS
1.0000 mg | ORAL_TABLET | ORAL | Status: DC | PRN
  Filled 2022-04-20: qty 1

## 2022-04-20 MED ORDER — THIAMINE HCL 100 MG/ML IJ SOLN: 100.0000 mg | Freq: Once | INTRAMUSCULAR | Status: DC

## 2022-04-20 MED ORDER — HYDROMORPHONE HCL 2 MG PO TABS
2.0000 mg | ORAL_TABLET | Freq: Once | ORAL | Status: AC
  Administered 2022-04-20: 2 mg via ORAL
  Filled 2022-04-20: qty 1

## 2022-04-20 MED ORDER — ROSUVASTATIN CALCIUM 5 MG PO TABS
10.0000 mg | ORAL_TABLET | Freq: Every day | ORAL | Status: DC
  Administered 2022-04-20 – 2022-04-21 (×2): 10 mg via ORAL
  Filled 2022-04-20 (×2): qty 2

## 2022-04-20 MED ORDER — CHLORDIAZEPOXIDE HCL 5 MG PO CAPS
25.0000 mg | ORAL_CAPSULE | Freq: Three times a day (TID) | ORAL | Status: AC
  Administered 2022-04-21 (×3): 25 mg via ORAL
  Filled 2022-04-20 (×3): qty 5

## 2022-04-20 MED ORDER — ASPIRIN 81 MG PO CHEW
81.0000 mg | CHEWABLE_TABLET | Freq: Every day | ORAL | Status: DC
  Administered 2022-04-20 – 2022-04-22 (×3): 81 mg via ORAL
  Filled 2022-04-20 (×3): qty 1

## 2022-04-20 MED ORDER — HYDROMORPHONE HCL 2 MG PO TABS
2.0000 mg | ORAL_TABLET | ORAL | Status: DC | PRN
  Administered 2022-04-20 – 2022-04-22 (×9): 2 mg via ORAL
  Filled 2022-04-20 (×10): qty 1

## 2022-04-20 MED ORDER — THIAMINE HCL 100 MG PO TABS
100.0000 mg | ORAL_TABLET | Freq: Every day | ORAL | Status: DC
  Administered 2022-04-20 – 2022-04-22 (×3): 100 mg via ORAL
  Filled 2022-04-20 (×3): qty 1

## 2022-04-20 MED ORDER — MAGNESIUM OXIDE -MG SUPPLEMENT 400 (240 MG) MG PO TABS
400.0000 mg | ORAL_TABLET | Freq: Two times a day (BID) | ORAL | Status: DC
  Administered 2022-04-20 – 2022-04-22 (×4): 400 mg via ORAL
  Filled 2022-04-20 (×4): qty 1

## 2022-04-20 MED ORDER — ACETAMINOPHEN 650 MG RE SUPP: 650.0000 mg | Freq: Four times a day (QID) | RECTAL | Status: DC | PRN

## 2022-04-20 MED ORDER — SODIUM CHLORIDE 0.9 % IV SOLN: INTRAVENOUS | Status: AC

## 2022-04-20 MED ORDER — LOPERAMIDE HCL 2 MG PO CAPS: 2.0000 mg | ORAL_CAPSULE | ORAL | Status: DC | PRN

## 2022-04-20 MED ORDER — SODIUM CHLORIDE 0.9 % IV SOLN: INTRAVENOUS | Status: DC

## 2022-04-20 MED ORDER — LORAZEPAM 2 MG/ML IJ SOLN
1.0000 mg | INTRAMUSCULAR | Status: DC | PRN
  Administered 2022-04-20 – 2022-04-21 (×2): 1 mg via INTRAVENOUS
  Administered 2022-04-22 (×2): 2 mg via INTRAVENOUS
  Filled 2022-04-20 (×4): qty 1

## 2022-04-20 MED ORDER — CHLORDIAZEPOXIDE HCL 5 MG PO CAPS
25.0000 mg | ORAL_CAPSULE | ORAL | Status: DC
  Administered 2022-04-22: 25 mg via ORAL
  Filled 2022-04-20: qty 5

## 2022-04-20 MED ORDER — MAGNESIUM SULFATE 4 GM/100ML IV SOLN
4.0000 g | Freq: Once | INTRAVENOUS | Status: AC
  Administered 2022-04-20: 4 g via INTRAVENOUS
  Filled 2022-04-20: qty 100

## 2022-04-20 MED ORDER — ADULT MULTIVITAMIN W/MINERALS CH
1.0000 | ORAL_TABLET | Freq: Every day | ORAL | Status: DC
  Administered 2022-04-20 – 2022-04-22 (×3): 1 via ORAL
  Filled 2022-04-20 (×3): qty 1

## 2022-04-20 MED ORDER — LORAZEPAM 1 MG PO TABS
1.0000 mg | ORAL_TABLET | ORAL | Status: DC | PRN
  Administered 2022-04-20: 1 mg via ORAL
  Filled 2022-04-20 (×2): qty 1

## 2022-04-20 MED ORDER — NICOTINE 21 MG/24HR TD PT24
21.0000 mg | MEDICATED_PATCH | Freq: Every day | TRANSDERMAL | Status: DC
Start: 2022-04-20 — End: 2022-04-22
  Administered 2022-04-20 – 2022-04-22 (×4): 21 mg via TRANSDERMAL
  Filled 2022-04-20 (×4): qty 1

## 2022-04-20 MED ORDER — HYDROXYZINE HCL 25 MG PO TABS
25.0000 mg | ORAL_TABLET | Freq: Four times a day (QID) | ORAL | Status: DC | PRN
  Administered 2022-04-20: 25 mg via ORAL
  Filled 2022-04-20: qty 1

## 2022-04-20 MED ORDER — SODIUM CHLORIDE 0.9 % IV BOLUS
1000.0000 mL | Freq: Once | INTRAVENOUS | Status: AC
  Administered 2022-04-20: 1000 mL via INTRAVENOUS

## 2022-04-20 MED ORDER — CHLORDIAZEPOXIDE HCL 5 MG PO CAPS
25.0000 mg | ORAL_CAPSULE | Freq: Four times a day (QID) | ORAL | Status: AC
  Administered 2022-04-20 (×4): 25 mg via ORAL
  Filled 2022-04-20: qty 5
  Filled 2022-04-20: qty 1
  Filled 2022-04-20 (×2): qty 5

## 2022-04-20 NOTE — ED Notes (Signed)
Pt continues to sleep, NAD noted, RR even, wife remains at the bedside, call bell in reach, side rails up x2 for safety, care on going, will continue to monitor.

## 2022-04-20 NOTE — Assessment & Plan Note (Addendum)
Stable.   Patient maintained on home regimen Crestor 10 mg daily, aspirin 81 mg daily during the hospitalization. -Beta-blocker initially held as well as ARB and HCTZ on presentation due to hypotension. -Half home dose beta-blocker resumed on day of discharge and patient be discharged home on home regimen beta-blocker. -ARB/HCTZ will be discontinued on discharge due to presentation with acute kidney injury. -Outpatient follow-up with PCP and primary cardiologist as previously scheduled.

## 2022-04-20 NOTE — ED Notes (Signed)
Secure message sent to Dr. [name], pt requesting additional pain medication

## 2022-04-20 NOTE — Subjective & Objective (Signed)
CC: hypotension HPI: 41 yo WM hx of psoriatic arthritis, morbid obesity, HTN, alcohol abuse, presents to ER with hypotension and dizziness from PCP office. pt seen by PCP beginning of July 2023 for annual physical. HCTZ added and lopressor dose increased. wife states she thought it was strange because pt's BP was not elevated during office visit.  since medication changes, pt has felt lethargic and more tired.  pt noted to have dark colored urine for the last 1-2 days.  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 SBP was in the 90s.  Coincidentally, pt stopped drinking etoh about 24 hours ago. pt had been drinking 1/5 of liquor per day for the last month. wife and pt had inquired about alcohol treatment programs recently.  pt went to PCP office where SBP noted to be in the 70s. pt referred to ER.  on arrival to ER, BP 60/34.  pt given IVF. SCr noted to be >5. was normal on annual labs in july 2023.  TR[facility] called for admission due to AKI and acute etoh withdrawal.

## 2022-04-20 NOTE — ED Notes (Signed)
Pt appears to be sleeping, even RR and unlabored, NAD noted, call bell in reach, side rails up x2 for safety, care on going, will continue to monitor. Wife at the bedside

## 2022-04-20 NOTE — Consult Note (Addendum)
Reason for Consult: AKI  Referring Physician: Grandville Silos, MD  Scott Waller is an 41 y.o. male with a PMH significant for CAD s/p NSTEMI with DES to LAD (10/2018), HTN, HLD, tobacco and alcohol abuse, morbid obesity, and psoriatic arthritis who presented to [facility] ED from PCP's office with hypotension and dizziness.  Per history, his lopressor dose was increased and he was continued on Losartan-HCT 100-25 on 04/02/22.  BP was 132/80 at that visit.  In the ED, BP was 60/34 and pt started on IVF's.  Temp 97.9, SpO2 90%.  Labs were notable for Na 130, Cl 93, Co2 12, BUN 27, Cr 5.22, alb 3.1, AST 80, ALT 89, T bili 1.7, lactate 3.6, plts 118, Etoh 14, UA mod blood, 100 protein, + ketones.  He was admitted for further treatment and we were consulted to evaluate and manage his AKI.  The trend in Scr is seen below.   He denies any N/V/D but does admit to poor appetite and po intake other than liquor.  He has had some lower extremity edema for some time but no SOB, orthopnea, or PND.  Trend in Creatinine: Creatinine, Ser  Date/Time Value Ref Range Status  04/20/2022 03:44 AM 4.50 (H) 0.61 - 1.24 mg/dL Final  04/19/2022 06:46 PM 5.22 (H) 0.61 - 1.24 mg/dL Final  04/03/2022 1.26 0.76 - 1.27 mg/dL Final  03/03/2021 0.79  0.76 - 1.27 mg/dL Final  08/06/2020 0.91 0.76 - 1.27 mg/dL Final  10/16/2018 03:31 AM 0.94 0.61 - 1.24 mg/dL Final  10/15/2018 11:15 AM 0.89 0.61 - 1.24 mg/dL Final  10/14/2018 01:50 PM 0.77 0.61 - 1.24 mg/dL Final  10/13/2018 11:36 PM 0.88 0.61 - 1.24 mg/dL Final  08/14/2018 01:45 PM 1.00 0.61 - 1.24 mg/dL Final  09/28/2017 05:05 AM 0.93 0.61 - 1.24 mg/dL Final    PMH:   Past Medical History:  Diagnosis Date   Anxiety and depression    Arthritis    Asthma    Bronchitis    CAD (coronary artery disease)    a. 10/15/2018 DES to prox LAD   GERD (gastroesophageal reflux disease)    History of atrial fibrillation    During alcohol binge   Hyperlipidemia with target LDL less than 70     Hypertension    Tobacco abuse     PSH:   Past Surgical History:  Procedure Laterality Date   APPENDECTOMY     CORONARY STENT INTERVENTION N/A 10/15/2018   Procedure: CORONARY STENT INTERVENTION;  Surgeon: Jettie Booze, MD;  Location: [facility];  Service: Cardiovascular;  Laterality: N/A;   INTRAVASCULAR ULTRASOUND/IVUS N/A 10/15/2018   Procedure: Intravascular Ultrasound/IVUS;  Surgeon: Jettie Booze, MD;  Location: [facility];  Service: Cardiovascular;  Laterality: N/A;   LEFT HEART CATH AND CORONARY ANGIOGRAPHY N/A 10/15/2018   Procedure: LEFT HEART CATH AND CORONARY ANGIOGRAPHY;  Surgeon: Jettie Booze, MD;  Location: [facility];  Service: Cardiovascular;  Laterality: N/A;    Allergies:  Allergies  Allergen Reactions   Desvenlafaxine Anaphylaxis    High BP    Medications:   Prior to Admission medications   Medication Sig Start Date End Date Taking? Authorizing Provider  albuterol (PROVENTIL) (2.5 MG/3ML) 0.083% nebulizer solution Take 3 mLs (2.5 mg total) by nebulization every 4 (four) hours as needed for wheezing or shortness of breath. 09/28/17  Yes Ripley Fraise, MD  ALPRAZolam Duanne Moron) 0.5 MG tablet Take 0.5 mg by mouth 3 (three) times daily as needed for  anxiety.  11/07/16  Yes [provider]  Clobetasol Propionate (TEMOVATE) 0.05 % external spray Apply 1 application topically 2 (two) times daily as needed. 08/12/18  Yes [provider]  COSENTYX SENSOREADY, 300 MG, 150 MG/ML SOAJ Inject 150 mg into the skin every 30 (thirty) days. 07/29/18  Yes [provider]  lamoTRIgine (LAMICTAL) 200 MG tablet Take 200 mg by mouth 2 (two) times daily. 04/03/22  Yes [provider]  losartan-hydrochlorothiazide (HYZAAR) 100-25 MG tablet Take 1 tablet by mouth daily. 04/03/22  Yes [provider]  metoprolol tartrate (LOPRESSOR) 25 MG tablet Take 1 tablet (25 mg total) by mouth 2 (two) times daily. 10/16/18   Yes Daune Perch, NP  Multiple Vitamins-Minerals (MULTIVITAMIN MEN PO) Take 1 tablet by mouth daily.   Yes [provider]  nicotine (NICODERM CQ - DOSED IN MG/24 HOURS) 21 mg/24hr patch Place 1 patch (21 mg total) onto the skin daily. 10/16/18  Yes Daune Perch, NP  nitroGLYCERIN (NITROSTAT) 0.4 MG SL tablet Place 0.4 mg under the tongue every 5 (five) minutes x 3 doses as needed. If no improvement, call 911 08/08/18  Yes [provider]  ondansetron (ZOFRAN-ODT) 8 MG disintegrating tablet Take 1 tablet (8 mg total) by mouth every 8 (eight) hours as needed for nausea or vomiting. 08/14/18  Yes [name], [name], PA-C  Oxycodone HCl 10 MG TABS Take 10 mg by mouth 4 (four) times daily as needed (pain). 04/17/22  Yes [provider]  traZODone (DESYREL) 150 MG tablet Take 150 mg by mouth at bedtime.   Yes [provider]  fluticasone (FLONASE) 50 MCG/ACT nasal spray Place 2 sprays into both nostrils daily. Patient not taking: Reported on 04/20/2022 08/10/18   [provider]  HYDROcodone-acetaminophen (NORCO) 10-325 MG tablet Take 1 tablet by mouth 3 (three) times daily.  Patient not taking: Reported on 04/20/2022 11/07/16   [provider]  methocarbamol (ROBAXIN) 500 MG tablet Take 1 tablet (500 mg total) by mouth 2 (two) times daily. Patient not taking: Reported on 04/20/2022 08/14/18   Arlean Hopping C, PA-C  metoprolol succinate (TOPROL-XL) 50 MG 24 hr tablet Take 50 mg by mouth daily. Take with or immediately following a meal. Patient not taking: Reported on 04/20/2022    [provider]  rosuvastatin (CRESTOR) 20 MG tablet Take 10 mg by mouth daily. Patient not taking: Reported on 04/20/2022    [provider]  valsartan-hydrochlorothiazide (DIOVAN-HCT) 320-25 MG tablet Take 1 tablet by mouth daily. Patient not taking: Reported on 04/20/2022 06/10/18   [provider]    Inpatient medications:  aspirin  81 mg Oral Daily    chlordiazePOXIDE  25 mg Oral QID   Followed by   Derrill Memo ON 04/21/2022] chlordiazePOXIDE  25 mg Oral TID   Followed by   Derrill Memo ON 04/22/2022] chlordiazePOXIDE  25 mg Oral BH-qamhs   Followed by   Derrill Memo ON 04/23/2022] chlordiazePOXIDE  25 mg Oral Daily   folic acid  1 mg Oral Daily   heparin  5,000 Units Subcutaneous Q8H   multivitamin with minerals  1 tablet Oral Daily   nicotine  21 mg Transdermal Daily   rosuvastatin  10 mg Oral Daily   thiamine  100 mg Oral Daily   Or   thiamine  100 mg Intravenous Daily   thiamine  100 mg Intramuscular Once    Discontinued Meds:   Medications Discontinued During This Encounter  Medication Reason   clopidogrel (PLAVIX) 75 MG tablet  0.9 %  sodium chloride infusion    multivitamin with minerals tablet 1 tablet    thiamine tablet 100 mg    ALPRAZolam (XANAX) 1 MG tablet Dose change   BREO ELLIPTA 100-25 MCG/INH AEPB Patient Preference   aspirin 81 MG chewable tablet Patient Preference   CARAFATE 1 GM/10ML suspension Patient Preference   lamoTRIgine (LAMICTAL) 25 MG tablet Dose change   HYDROmorphone (DILAUDID) tablet 1 mg Duplicate    Social History:  reports that he has been smoking cigarettes. He has a 25.00 pack-year smoking history. He has never used smokeless tobacco. He reports current alcohol use of about 20.0 - 24.0 standard drinks of alcohol per week. He reports that he does not use drugs.  Family History:   Family History  Problem Relation Age of Onset   Hypertension Father     Pertinent items are noted in HPI. Weight change:   Intake/Output Summary (Last 24 hours) at 04/20/2022 1700 Last data filed at 04/20/2022 1647 Gross per 24 hour  Intake 1071.18 ml  Output 750 ml  Net 321.18 ml   BP 129/71   Pulse 95   Temp 98.4 F (36.9 C) (Oral)   Resp 18   Ht '6\' 2"'  (1.88 m)   Wt (!) 165.1 kg   SpO2 98%   BMI 46.73 kg/m  Vitals:   04/20/22 1229 04/20/22 1242 04/20/22 1529 04/20/22 1644  BP: (!) 156/68  (!) 94/41 129/71   Pulse: 94  99 95  Resp: 19   18  Temp: 98.2 F (36.8 C)  98.3 F (36.8 C) 98.4 F (36.9 C)  TempSrc: Oral  Oral Oral  SpO2: 96%  96% 98%  Weight:  (!) 165.1 kg    Height:  '6\' 2"'  (1.88 m)       General appearance: fatigued, no distress, morbidly obese, and shaky Head: Normocephalic, without obvious abnormality, atraumatic Resp: clear to auscultation bilaterally Cardio: regular rate and rhythm, S1, S2 normal, no murmur, click, rub or gallop GI: soft, non-tender; bowel sounds normal; no masses,  no organomegaly Extremities: edema 1+ pretibial edema bilaterally  Labs: Basic Metabolic Panel: Recent Labs  Lab 04/19/22 1846 04/20/22 0344  NA 130* 128*  K 4.2 4.1  CL 93* 94*  CO2 12* 19*  GLUCOSE 81 91  BUN 27* 27*  CREATININE 5.22* 4.50*  ALBUMIN 3.1* 2.9*  CALCIUM 8.0* 8.1*   Liver Function Tests: Recent Labs  Lab 04/19/22 1846 04/20/22 0344  AST 80* 73*  ALT 89* 86*  ALKPHOS 65 57  BILITOT 1.7* 1.5*  PROT 6.3* 5.7*  ALBUMIN 3.1* 2.9*   No results for input(s): "LIPASE", "AMYLASE" in the last 168 hours. No results for input(s): "AMMONIA" in the last 168 hours. CBC: Recent Labs  Lab 04/19/22 1846  WBC 4.9  NEUTROABS 3.9  HGB 12.3*  HCT 36.4*  MCV 102.8*  PLT 118*   PT/INR: '@LABRCNTIP' (inr:5) Cardiac Enzymes: )No results for input(s): "CKTOTAL", "CKMB", "CKMBINDEX", "TROPONINI" in the last 168 hours. CBG: No results for input(s): "GLUCAP" in the last 168 hours.  Iron Studies: No results for input(s): "IRON", "TIBC", "TRANSFERRIN", "FERRITIN" in the last 168 hours.  Xrays/Other Studies: US RENAL  Result Date: 04/20/2022 CLINICAL DATA:  [id number] acute renal failure EXAM: RENAL / URINARY TRACT ULTRASOUND COMPLETE COMPARISON:  None Available. FINDINGS: Right Kidney: Renal measurements: 14.1 x 5.4 x 7.9 cm = volume: 314.5 mL. Echogenicity within normal limits. No mass or hydronephrosis visualized. Left Kidney: Renal measurements: 13.7 x 7 x 6.9  cm = volume:  300 mL. Echogenicity within normal limits. No mass or hydronephrosis visualized. Bladder: Appears normal for degree of bladder distention. Other: Limited visualization of the liver shows the echogenicity of the hepatic parenchyma is increased. IMPRESSION: Unremarkable ultrasound of the kidneys. No hydronephrosis or hydroureter seen. Electronically Signed   By: Frazier Richards M.D.   On: 04/20/2022 11:57   DG Chest Portable 1 View  Result Date: 04/19/2022 CLINICAL DATA:  Weakness EXAM: PORTABLE CHEST 1 VIEW COMPARISON:  10/13/2018 FINDINGS: The heart size and mediastinal contours are within normal limits. Both lungs are clear. The visualized skeletal structures are unremarkable. IMPRESSION: No active disease. Electronically Signed   By: Ulyses Jarred M.D.   On: 04/19/2022 21:16     Assessment/Plan:  AKI - presumably ischemic ATN in setting of hypotension, volume depletion, and concomitant ARB and HCTZ therapy.  BUN/Cr improving with IVF's overnight.  Continue with IVF's and holding HYZAAR. Renal dose meds for eGFR <15 ml/min Avoid nephrotoxic medications including NSAIDs and iodinated intravenous contrast exposure unless the latter is absolutely indicated. Preferred narcotic agents for pain control are hydromorphone, fentanyl, and methadone. Morphine should not be used.  Avoid Baclofen and avoid oral sodium phosphate and magnesium citrate based laxatives / bowel preps.  Continue strict Input and Output monitoring.  Will monitor the patient closely with you and intervene or adjust therapy as indicated by changes in clinical status/labs  Hypotension - likely combination of volume depletion and recent increase in BP meds.  Improving with IVF's and holding meds.  Hyponatremia - likely pseudohyponatremia given normal serum osmolality.  Continue with IVF's and follow.  Agree with change to IV NS given hypochloremia. Alcoholic hepatitis - slowly improving with IVF's.  Metabolic acidosis - improving with  lactated ringers. Hypomagnesemia - due to Etoh use.  Will start magnesium oxide oral replacement.  IV magnesium replacement is often ineffective unless give over long periods of time (ie 6-8 hours) due to tubular wasting, especially in Etoh abuse.  Alcohol withdrawals - under CIWA protocol.  He is interested in treatment program after discharge.  CM will assist with paperwork.  On prn ativan. Lower extremity edema - likely due to hypoalbuminemia related to liver injury.  Continue to follow and hold off on diuretics. Tobacco abuse - nicotine patch per primary Thrombocytopenia - likely due to liver disease.   Scott Waller 04/20/2022, 5:00 PM

## 2022-04-20 NOTE — ED Notes (Signed)
Pt has OSA, does not have machine with him (Cpap), sats decrease to 88% on RA while sleeping, placing pt on 2L [place] for comfort

## 2022-04-20 NOTE — Progress Notes (Signed)
I have seen and assessed patient and agree with Dr. Ruthe Mannan assessment and plan.  Patient is a 41 year old gentleman history of psoriatic arthritis, morbid obesity, hypertension, alcohol abuse presented to the ED with hypotension and dizziness from PCPs office.  Noted to have had adjustments in his antihypertensive medications.  Patient noted to have felt lethargic and more tired with dark-colored urine over the past 1 to 2 days prior to admission.  Patient with a history of alcohol abuse stopped drinking 24 hours prior to admission and inquiring about alcohol treatment programs.  Patient seen in PCPs office systolic blood pressures noted in the 70s and sent to the ED.  Patient on presentation to the ED had blood pressure of 60/34, serum creatinine noted to be greater than 5 noted to be normal on his annual labs in July 2023.  Patient admitted for acute kidney injury, acute alcohol withdrawal.  Patient given IV fluids with some improvement with blood pressure however blood pressure now soft in the mid 90s.  Patient also noted to be hyponatremic, hypomagnesemic.  Urinalysis nitrite negative, leukocytes negative, 100 protein.  Urine osmolality of 235.  Urine sodium 61, urine creatinine 81.  Renal sono ordered this morning unremarkable with no hydronephrosis or hydroureter seen.  Patient noted to have 2+ lower extremity edema.  Continue IV fluids.  Give IV albumin.  Family requesting nephrology consultation.  Consult with nephrology for further evaluation and management.  Placed on the Librium detox protocol.  IV Ativan as needed for alcohol withdrawal.  TOC consulted.  No charge.

## 2022-04-20 NOTE — Assessment & Plan Note (Addendum)
Patient had oxycodone at home for his psoriatic arthritis.  -Patient maintained on IV Dilaudid during the hospitalization for pain management due to AKI.   -Chronic home pain regimen will be resumed on discharge.

## 2022-04-20 NOTE — Progress Notes (Signed)
PHARMACY - PHYSICIAN COMMUNICATION CRITICAL VALUE ALERT - BLOOD CULTURE IDENTIFICATION (BCID)  Assessment: 67 YOM presenting with suspected EtOH withdrawal.   Culture Result(s): 2/4 bottles (same set) gram-positive cocci in clusters   BCID Result(s): Staph epi (no resistance mechanisms detected   Suspected source(s): likely contaminant    Name of physician (or Provider) Contacted: Ramiro Harvest, MD   Changes to prescribed antibiotics recommended: None- monitor off of antibiotics (likely contaminant); awaiting provider response   Jani Gravel, PharmD PGY-2 Infectious Diseases Resident  04/20/2022 3:23 PM

## 2022-04-20 NOTE — Assessment & Plan Note (Addendum)
Chronic.   BMI of greater than 40.   Weight is 176.9 kg height is 6 feet 3 inches.

## 2022-04-20 NOTE — Assessment & Plan Note (Addendum)
-   Patient drinks at least 1/5 of liquor a day.  - Patient and wife are interested in the patient going to residential drug/alcohol treatment program after discharge.  -TOC consulted to help patient and family with outpatient residential alcohol treatment program.  -Patient started on Librium detox protocol in addition to IV Ativan as needed, thiamine, multivitamin, folic acid and improved during the hospitalization with no significant alcohol withdrawal symptoms noted.   -Patient anxious to be discharged and as such was discharged on day 4/5 of Librium detox protocol.   -Outpatient follow-up with PCP.

## 2022-04-20 NOTE — H&P (Signed)
History and Physical    Kache Mcclurg KVQ:[id number] DOB: 05-27-1981 DOA: 04/19/2022  DOS: the patient was seen and examined on 04/19/2022  PCP: April Manson, NP   Patient coming from: Clinic  I have personally briefly reviewed patient's old medical records in [place] [facility]  CC: hypotension HPI: 41 yo WM hx of psoriatic arthritis, morbid obesity, HTN, alcohol abuse, presents to ER with hypotension and dizziness from PCP office. pt seen by PCP beginning of July 2023 for annual physical. HCTZ added and lopressor dose increased. wife states she thought it was strange because pt's BP was not elevated during office visit.  since medication changes, pt has felt lethargic and more tired.  pt noted to have dark colored urine for the last 1-2 days.  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 SBP was in the 90s.  Coincidentally, pt stopped drinking etoh about 24 hours ago. pt had been drinking 1/5 of liquor per day for the last month. wife and pt had inquired about alcohol treatment programs recently.  pt went to PCP office where SBP noted to be in the 70s. pt referred to ER.  on arrival to ER, BP 60/34.  pt given IVF. SCr noted to be >5. was normal on annual labs in july 2023.  TRH called for admission due to AKI and acute etoh withdrawal.   ED Course: hypotensive to the 70s on arrival. Given Ivf. Pt has urinated since IVF given.  Review of Systems:  Review of Systems  Constitutional: Negative.   HENT: Negative.    Eyes:  Positive for blurred vision.  Respiratory: Negative.    Cardiovascular: Negative.   Gastrointestinal: Negative.   Genitourinary:        Dark colored urine  Musculoskeletal:  Positive for falls.  Skin:        psoriasis  Neurological:  Positive for weakness.  Endo/Heme/Allergies: Negative.   Psychiatric/Behavioral:  Positive for substance abuse.   All other systems reviewed and are negative.   Past Medical History:  Diagnosis Date   Anxiety and  depression    Arthritis    Asthma    Bronchitis    CAD (coronary artery disease)    a. 10/15/2018 DES to prox LAD   GERD (gastroesophageal reflux disease)    History of atrial fibrillation    During alcohol binge   Hyperlipidemia with target LDL less than 70    Hypertension    Tobacco abuse     Past Surgical History:  Procedure Laterality Date   APPENDECTOMY     CORONARY STENT INTERVENTION N/A 10/15/2018   Procedure: CORONARY STENT INTERVENTION;  Surgeon: Corky Crafts, MD;  Location: [facility] INVASIVE CV LAB;  Service: Cardiovascular;  Laterality: N/A;   INTRAVASCULAR ULTRASOUND/IVUS N/A 10/15/2018   Procedure: Intravascular Ultrasound/IVUS;  Surgeon: Corky Crafts, MD;  Location: [facility] INVASIVE CV LAB;  Service: Cardiovascular;  Laterality: N/A;   LEFT HEART CATH AND CORONARY ANGIOGRAPHY N/A 10/15/2018   Procedure: LEFT HEART CATH AND CORONARY ANGIOGRAPHY;  Surgeon: Corky Crafts, MD;  Location: [facility] INVASIVE CV LAB;  Service: Cardiovascular;  Laterality: N/A;     reports that he has been smoking cigarettes. He has a 25.00 pack-year smoking history. He has never used smokeless tobacco. He reports current alcohol use of about 20.0 - 24.0 standard drinks of alcohol per week. He reports that he does not use drugs.  Allergies  Allergen Reactions   Desvenlafaxine Anaphylaxis    High BP  Family History  Problem Relation Age of Onset   Hypertension Father     Prior to Admission medications   Medication Sig Start Date End Date Taking? Authorizing Provider  albuterol (PROVENTIL) (2.5 MG/3ML) 0.083% nebulizer solution Take 3 mLs (2.5 mg total) by nebulization every 4 (four) hours as needed for wheezing or shortness of breath. 09/28/17   [name], MD  ALPRAZolam Prudy Feeler) 0.5 MG tablet Take 0.5 mg by mouth 3 (three) times daily as needed for anxiety.  11/07/16   [provider]  aspirin 81 MG chewable tablet Chew 1 tablet (81 mg total) by mouth daily. 10/16/18    Berton Bon, NP  BREO ELLIPTA 100-25 MCG/INH AEPB Take 1 puff by mouth daily. 07/26/18   [provider]  CARAFATE 1 GM/10ML suspension Take 10 mLs by mouth 4 (four) times daily -  with meals and at bedtime. 08/05/18   [provider]  Clobetasol Propionate (TEMOVATE) 0.05 % external spray Apply 1 application topically 2 (two) times daily as needed. 08/12/18   [provider]  clopidogrel (PLAVIX) 75 MG tablet Take 1 tablet (75 mg total) by mouth daily. Please make overdue appt with Dr. Eldridge Dace before anymore refills. Thank you 1st attempt 07/29/20   Corky Crafts, MD  COSENTYX SENSOREADY, 300 MG, 150 MG/ML SOAJ Inject 150 mg into the skin every 30 (thirty) days. 07/29/18   [provider]  fluticasone (FLONASE) 50 MCG/ACT nasal spray Place 2 sprays into both nostrils daily. 08/10/18   [provider]  HYDROcodone-acetaminophen (NORCO) 10-325 MG tablet Take 1 tablet by mouth 3 (three) times daily.  11/07/16   [provider]  lamoTRIgine (LAMICTAL) 25 MG tablet Take 50 mg by mouth daily.    [provider]  methocarbamol (ROBAXIN) 500 MG tablet Take 1 tablet (500 mg total) by mouth 2 (two) times daily. 08/14/18   [name], [name], PA-C  metoprolol tartrate (LOPRESSOR) 25 MG tablet Take 1 tablet (25 mg total) by mouth 2 (two) times daily. 10/16/18   Berton Bon, NP  Multiple Vitamins-Minerals (MULTIVITAMIN MEN PO) Take 1 tablet by mouth daily.    [provider]  nicotine (NICODERM CQ - DOSED IN MG/24 HOURS) 21 mg/24hr patch Place 1 patch (21 mg total) onto the skin daily. 10/16/18   Berton Bon, NP  nitroGLYCERIN (NITROSTAT) 0.4 MG SL tablet Place 0.4 mg under the tongue every 5 (five) minutes x 3 doses as needed. If no improvement, call 911 08/08/18   [provider]  ondansetron (ZOFRAN-ODT) 8 MG disintegrating tablet Take 1 tablet (8 mg total) by mouth every 8 (eight) hours as needed for nausea or vomiting.  08/14/18   [name], [name], PA-C  rosuvastatin (CRESTOR) 20 MG tablet Take 10 mg by mouth daily.    [provider]  traZODone (DESYREL) 150 MG tablet Take 150 mg by mouth at bedtime.    [provider]  valsartan-hydrochlorothiazide (DIOVAN-HCT) 320-25 MG tablet Take 1 tablet by mouth daily. 06/10/18   [provider]    Physical Exam: Vitals:   04/19/22 2245 04/19/22 2300 04/19/22 2315 04/19/22 2342  BP: (!) 100/52 (!) 105/52 (!) 126/47   Pulse: 86 88 95   Resp: 14 14 12    Temp:    (!) 97 F (36.1 C)  TempSrc:    Temporal  SpO2: 92% 91% 90%     Physical Exam Vitals and nursing note reviewed.  Constitutional:      General: He is not in  acute distress.    Appearance: He is obese. He is not ill-appearing, toxic-appearing or diaphoretic.  HENT:     Head: Normocephalic and atraumatic.  Eyes:     General: No scleral icterus.    Pupils: Pupils are equal, round, and reactive to light.  Cardiovascular:     Rate and Rhythm: Normal rate and regular rhythm.     Pulses: Normal pulses.  Pulmonary:     Effort: Pulmonary effort is normal.  Abdominal:     General: Abdomen is protuberant. There is no distension.     Palpations: Abdomen is soft.     Tenderness: There is no abdominal tenderness. There is no guarding.  Musculoskeletal:     Right lower leg: 2+ Edema present.     Left lower leg: 2+ Edema present.  Skin:    General: Skin is warm and dry.     Capillary Refill: Capillary refill takes less than 2 seconds.  Neurological:     General: No focal deficit present.     Mental Status: He is alert and oriented to person, place, and time.      Labs on Admission: I have personally reviewed following labs and imaging studies  CBC: Recent Labs  Lab 04/19/22 1846  WBC 4.9  NEUTROABS 3.9  HGB 12.3*  HCT 36.4*  MCV 102.8*  PLT 118*   Basic Metabolic Panel: Recent Labs  Lab 04/19/22 1846  NA 130*  K 4.2  CL 93*  CO2 12*  GLUCOSE 81  BUN 27*   CREATININE 5.22*  CALCIUM 8.0*   GFR: CrCl cannot be calculated (Unknown ideal weight.). Liver Function Tests: Recent Labs  Lab 04/19/22 1846  AST 80*  ALT 89*  ALKPHOS 65  BILITOT 1.7*  PROT 6.3*  ALBUMIN 3.1*   No results for input(s): "LIPASE", "AMYLASE" in the last 168 hours. No results for input(s): "AMMONIA" in the last 168 hours. Coagulation Profile: No results for input(s): "INR", "PROTIME" in the last 168 hours. Cardiac Enzymes: No results for input(s): "CKTOTAL", "CKMB", "CKMBINDEX", "TROPONINI", "TROPONINIHS" in the last 168 hours. BNP (last 3 results) No results for input(s): "PROBNP" in the last 8760 hours. HbA1C: No results for input(s): "HGBA1C" in the last 72 hours. CBG: No results for input(s): "GLUCAP" in the last 168 hours. Lipid Profile: No results for input(s): "CHOL", "HDL", "LDLCALC", "TRIG", "CHOLHDL", "LDLDIRECT" in the last 72 hours. Thyroid Function Tests: No results for input(s): "TSH", "T4TOTAL", "FREET4", "T3FREE", "THYROIDAB" in the last 72 hours. Anemia Panel: No results for input(s): "VITAMINB12", "FOLATE", "FERRITIN", "TIBC", "IRON", "RETICCTPCT" in the last 72 hours. Urine analysis:    Component Value Date/Time   COLORURINE AMBER (A) 04/19/2022 2129   APPEARANCEUR CLOUDY (A) 04/19/2022 2129   LABSPEC 1.019 04/19/2022 2129   PHURINE 5.0 04/19/2022 2129   GLUCOSEU NEGATIVE 04/19/2022 2129   HGBUR MODERATE (A) 04/19/2022 2129   BILIRUBINUR NEGATIVE 04/19/2022 2129   KETONESUR 5 (A) 04/19/2022 2129   PROTEINUR 100 (A) 04/19/2022 2129   NITRITE NEGATIVE 04/19/2022 2129   LEUKOCYTESUR NEGATIVE 04/19/2022 2129    Radiological Exams on Admission: I have personally reviewed images DG Chest Portable 1 View  Result Date: 04/19/2022 CLINICAL DATA:  Weakness EXAM: PORTABLE CHEST 1 VIEW COMPARISON:  10/13/2018 FINDINGS: The heart size and mediastinal contours are within normal limits. Both lungs are clear. The visualized skeletal structures  are unremarkable. IMPRESSION: No active disease. Electronically Signed   By: Deatra Robinson M.D.   On: 04/19/2022 21:16    EKG:  My personal interpretation of EKG shows: sinus tachycardia    Assessment/Plan Principal Problem:   Acute kidney injury (HCC) Active Problems:   Alcohol abuse with withdrawal without complication (HCC)   Essential hypertension   CAD (coronary artery disease)   Tobacco abuse   Hypotension   Chronic pain   Morbid obesity with BMI of 45.0-49.9, adult (HCC)   Psoriatic arthritis (HCC)    Assessment and Plan: * Acute kidney injury (HCC) Admit to inpatient telemetry bed. Hypotension improved after IVF. Hypotension likely due to dehydration, AKI and increase in HTN meds. Continue with IVf. Repeat BMP in AM. If Scr fails to improve, pt's wife would like nephrology consult in the AM. Wife works for [employer].  Alcohol abuse with withdrawal without complication (HCC) Patient drinks at least 1/5 of liquor a day.  Start CIWA protocol.  Patient and wife are interested in the patient going to residential drug/alcohol treatment program after discharge.  Will need case management involvement to help patient and family with the paperwork.  Start p.o./IV Ativan as needed.  Psoriatic arthritis (HCC) Chronic.  Morbid obesity with BMI of 45.0-49.9, adult (HCC) Chronic.  BMI of greater than 40.  Weight is 176.9 kg height is 6 feet 3 inches.  Chronic pain Patient had oxycodone at home for his psoriatic arthritis.  We use p.o. Dilaudid due to his acute kidney failure.  Hypotension Hypotension due to recent increase in his blood pressure medications.  Tobacco abuse Patient smokes at least a pack a day.  Nicotine patch 21 mg daily ordered.  CAD (coronary artery disease) Stable.  Continue Crestor 10 mg daily, aspirin 81 mg daily.  Holding his beta-blocker until blood pressure returns to normal.  Essential hypertension Hold all of his antihypertensives due to  hypotension.  We will need to stop his ARB/HCTZ.  Decrease his Lopressor to 12.5 mg twice daily once his blood pressure returns to normal without IV fluids.   DVT prophylaxis: SQ Heparin Code Status: Full Code Family Communication: discussed with pt and wife linda at bedside  Disposition Plan: return home vs residential detox program  Consults called: none  Admission status: Inpatient, Telemetry bed   Carollee Herter, DO [facility] 04/20/2022, 1:47 AM

## 2022-04-20 NOTE — Assessment & Plan Note (Addendum)
-   Felt secondary to ischemic ATN in the setting of hypotension, volume depletion in the setting of concomitant ARB HCTZ therapy.   -Patient was noted to be hypotensive on admission and hydrated aggressively with IV fluids. -Antihypertensive medications held during the hospitalization. -Renal ultrasound done unremarkable with no hydronephrosis or hydroureter seen. -Nephrology consulted on the patient during the hospitalization and followed the patient throughout the hospitalization. -Urine output improved during the hospitalization, renal function trended down such that by day of discharge creatinine was down to 1.44 from 5.22 on admission. -Patient's ARB HCTZ will be discontinued on discharge. -Outpatient follow-up with nephrology.

## 2022-04-20 NOTE — Assessment & Plan Note (Addendum)
Chronic. Outpatient follow-up. ?

## 2022-04-20 NOTE — Assessment & Plan Note (Addendum)
Hypotension noted on admission due to recent increase in his blood pressure medications. -Blood pressure improved with hydration during the hospitalization.  -Patient received a dose of IV albumin.  -Antihypertensives were held during most of the hospitalization and home regimen Lopressor resumed on day of discharge.  -Hypotension had resolved by day of discharge.

## 2022-04-20 NOTE — Assessment & Plan Note (Addendum)
Patient smokes at least a pack a day.  -Patient placed on nicotine patch.

## 2022-04-20 NOTE — Assessment & Plan Note (Addendum)
-   Patient presented with hypotension, requiring fluid resuscitation and IV fluids.   -Continue to hold antihypertensive medications.   -If further blood pressure control is needed we will resume patient's beta-blocker.   -IV fluids.

## 2022-04-21 ENCOUNTER — Inpatient Hospital Stay (HOSPITAL_COMMUNITY): Payer: Self-pay

## 2022-04-21 DIAGNOSIS — F411 Generalized anxiety disorder: Secondary | ICD-10-CM

## 2022-04-21 DIAGNOSIS — R601 Generalized edema: Secondary | ICD-10-CM

## 2022-04-21 DIAGNOSIS — N179 Acute kidney failure, unspecified: Principal | ICD-10-CM

## 2022-04-21 DIAGNOSIS — R7401 Elevation of levels of liver transaminase levels: Secondary | ICD-10-CM | POA: Diagnosis present

## 2022-04-21 LAB — ECHOCARDIOGRAM COMPLETE
AR max vel: 2.8 cm2
AV Peak grad: 11 mmHg
Ao pk vel: 1.66 m/s
Area-P 1/2: 3.97 cm2
Height: 74 in
S' Lateral: 3.6 cm
Weight: 5824 oz

## 2022-04-21 LAB — CBC WITH DIFFERENTIAL/PLATELET
Abs Immature Granulocytes: 0.01 10*3/uL (ref 0.00–0.07)
Basophils Absolute: 0 10*3/uL (ref 0.0–0.1)
Basophils Relative: 1 %
Eosinophils Absolute: 0 10*3/uL (ref 0.0–0.5)
Eosinophils Relative: 1 %
HCT: 31.7 % — ABNORMAL LOW (ref 39.0–52.0)
Hemoglobin: 10.9 g/dL — ABNORMAL LOW (ref 13.0–17.0)
Immature Granulocytes: 0 %
Lymphocytes Relative: 21 %
Lymphs Abs: 0.7 10*3/uL (ref 0.7–4.0)
MCH: 34.5 pg — ABNORMAL HIGH (ref 26.0–34.0)
MCHC: 34.4 g/dL (ref 30.0–36.0)
MCV: 100.3 fL — ABNORMAL HIGH (ref 80.0–100.0)
Monocytes Absolute: 0.4 10*3/uL (ref 0.1–1.0)
Monocytes Relative: 13 %
Neutro Abs: 2.2 10*3/uL (ref 1.7–7.7)
Neutrophils Relative %: 64 %
Platelets: 110 10*3/uL — ABNORMAL LOW (ref 150–400)
RBC: 3.16 MIL/uL — ABNORMAL LOW (ref 4.22–5.81)
RDW: 15.3 % (ref 11.5–15.5)
WBC: 3.4 10*3/uL — ABNORMAL LOW (ref 4.0–10.5)
nRBC: 0 % (ref 0.0–0.2)

## 2022-04-21 LAB — COMPREHENSIVE METABOLIC PANEL
ALT: 85 U/L — ABNORMAL HIGH (ref 0–44)
AST: 72 U/L — ABNORMAL HIGH (ref 15–41)
Albumin: 3 g/dL — ABNORMAL LOW (ref 3.5–5.0)
Alkaline Phosphatase: 58 U/L (ref 38–126)
Anion gap: 10 (ref 5–15)
BUN: 14 mg/dL (ref 6–20)
CO2: 24 mmol/L (ref 22–32)
Calcium: 8.7 mg/dL — ABNORMAL LOW (ref 8.9–10.3)
Chloride: 100 mmol/L (ref 98–111)
Creatinine, Ser: 2.35 mg/dL — ABNORMAL HIGH (ref 0.61–1.24)
GFR, Estimated: 34 mL/min — ABNORMAL LOW (ref >60)
Glucose, Bld: 105 mg/dL — ABNORMAL HIGH (ref 70–99)
Potassium: 3.7 mmol/L (ref 3.5–5.1)
Sodium: 134 mmol/L — ABNORMAL LOW (ref 135–145)
Total Bilirubin: 1.6 mg/dL — ABNORMAL HIGH (ref 0.3–1.2)
Total Protein: 6.1 g/dL — ABNORMAL LOW (ref 6.5–8.1)

## 2022-04-21 LAB — HEPATITIS PANEL, ACUTE
HCV Ab: NONREACTIVE
Hep A IgM: NONREACTIVE
Hep B C IgM: NONREACTIVE
Hepatitis B Surface Ag: NONREACTIVE

## 2022-04-21 LAB — MAGNESIUM: Magnesium: 1.8 mg/dL (ref 1.7–2.4)

## 2022-04-21 MED ORDER — LAMOTRIGINE 100 MG PO TABS
200.0000 mg | ORAL_TABLET | Freq: Two times a day (BID) | ORAL | Status: DC
  Administered 2022-04-21 – 2022-04-22 (×2): 200 mg via ORAL
  Filled 2022-04-21 (×2): qty 2

## 2022-04-21 MED ORDER — FLUTICASONE PROPIONATE 50 MCG/ACT NA SUSP: 2.0000 | Freq: Every day | NASAL | Status: DC

## 2022-04-21 MED ORDER — CLOBETASOL PROPIONATE 0.05 % EX CREA: 1.0000 | TOPICAL_CREAM | Freq: Two times a day (BID) | CUTANEOUS | Status: DC | PRN

## 2022-04-21 NOTE — Assessment & Plan Note (Signed)
-   Likely secondary to chronic alcohol use. -Acute hepatitis panel negative. -Outpatient follow-up.

## 2022-04-21 NOTE — Assessment & Plan Note (Signed)
-   Repleted. -Patient will be discharged on magnesium oxide 800 mg twice daily x7 days. -Outpatient follow-up for repeat labs and further management.

## 2022-04-21 NOTE — Assessment & Plan Note (Signed)
-   Remained stable on Librium detox protocol during the hospitalization.   -Home regimen anxiolytics were held during the hospitalization and will be resumed on discharge.

## 2022-04-21 NOTE — Plan of Care (Signed)

## 2022-04-21 NOTE — Plan of Care (Signed)
  Problem: Pain Managment: Goal: General experience of comfort will improve Outcome: Progressing   Problem: Clinical Measurements: Goal: Ability to maintain clinical measurements within normal limits will improve Outcome: Progressing Goal: Will remain free from infection Outcome: Progressing Goal: Diagnostic test results will improve Outcome: Progressing Goal: Respiratory complications will improve Outcome: Progressing Goal: Cardiovascular complication will be avoided Outcome: Progressing   

## 2022-04-21 NOTE — Progress Notes (Signed)
PROGRESS NOTE    Scott Waller  ZWC:[id number] DOB: [birth year] DOA: 04/19/2022 PCP: April Manson, NP    Chief Complaint  Patient presents with   ETOH withdraw   Hypotension    Brief Narrative:  HPI per Dr. [name] 41 yo WM hx of psoriatic arthritis, morbid obesity, HTN, alcohol abuse, presents to ER with hypotension and dizziness from PCP office. pt seen by PCP beginning of July 2023 for annual physical. HCTZ added and lopressor dose increased. wife states she thought it was strange because pt's BP was not elevated during office visit.   since medication changes, pt has felt lethargic and more tired.  pt noted to have dark colored urine for the last 1-2 days.  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 SBP was in the 90s.   Coincidentally, pt stopped drinking etoh about 24 hours ago. pt had been drinking 1/5 of liquor per day for the last month. wife and pt had inquired about alcohol treatment programs recently.   pt went to PCP office where SBP noted to be in the 70s. pt referred to ER.   on arrival to ER, BP 60/34.  pt given IVF. SCr noted to be >5. was normal on annual labs in july 2023.   TRH called for admission due to AKI and acute etoh withdrawal.    ED Course: hypotensive to the 70s on arrival. Given Ivf. Pt has urinated since IVF given.    Assessment & Plan:  Principal Problem:   Acute kidney injury (HCC) Active Problems:   Alcohol abuse with withdrawal without complication (HCC)   Essential hypertension   CAD (coronary artery disease)   Tobacco abuse   Hypotension   Chronic pain   Morbid obesity with BMI of 45.0-49.9, adult (HCC)   Psoriatic arthritis (HCC)   Generalized anxiety disorder   Hypomagnesemia   Transaminitis   AKI (acute kidney injury) (HCC)    Assessment and Plan: * Acute kidney injury (HCC) - Hypotension improved after IVF.  - Hypotension likely due to dehydration, AKI and increase in HTN meds in the setting of ARB and  HCTZ. -Renal function improving with hydration and holding antihypertensive medications.  -Per nephrology.  Alcohol abuse with withdrawal without complication (HCC) - Patient drinks at least 1/5 of liquor a day.  - Patient and wife are interested in the patient going to residential drug/alcohol treatment program after discharge.  Will need case management involvement to help patient and family with the paperwork.   -Patient started on Librium detox protocol which we will continue, continue thiamine, multivitamin, folic acid  - IV Ativan as needed.   Psoriatic arthritis (HCC) Chronic. Outpatient follow-up.  Morbid obesity with BMI of 45.0-49.9, adult (HCC) Chronic.   BMI of greater than 40.   Weight is 176.9 kg height is 6 feet 3 inches.  Chronic pain Patient had oxycodone at home for his psoriatic arthritis.  -Continue Dilaudid secondary to AKI.   Hypotension Hypotension due to recent increase in his blood pressure medications. -Blood pressure improving with hydration. -Status post IV albumin. -Continue to hold antihypertensive medications. -Follow.  Tobacco abuse Patient smokes at least a pack a day.  -Continue nicotine patch.    CAD (coronary artery disease) Stable.   Continue Crestor 10 mg daily, aspirin 81 mg daily.   -Continue to hold beta-blocker secondary to hypotension/low blood pressure.   -Could likely resume beta-blocker in 1 to 2 days or if further blood pressure control is needed.  Essential hypertension - Patient presented with hypotension, requiring fluid resuscitation and IV fluids.   -Continue to hold antihypertensive medications.   -If further blood pressure control is needed we will resume patient's beta-blocker.   -IV fluids.   Transaminitis - Likely secondary to chronic alcohol use. -Acute hepatitis panel negative. -Follow-up.  Hypomagnesemia - Repleted.  Generalized anxiety disorder - Currently on Librium detox protocol. -Hold anxiolytics  until of Librium detox protocol.         DVT prophylaxis: Heparin. Code Status: Full Family Communication: Updated patient, wife at bedside Disposition: Likely home when clinically improved, renal function normalized, completion of Librium detox protocol and when cleared by nephrology hopefully in 2 days  Status is: Inpatient Remains inpatient appropriate because: Severity of illness   Consultants:  Labs nephrology: Dr. Arrie Aran 04/20/2022  Procedures:  Chest x-ray 04/19/2022 Renal ultrasound 04/20/2022 2D echo 04/21/2022  Antimicrobials:  None   Subjective: Patient sitting up at the side of the bed.  Feels better.  No chest pain.  No shortness of breath.  No abdominal pain.  States has good urine output.  Feels no significant withdrawal.  Wife at bedside.  Objective: Vitals:   04/21/22 0030 04/21/22 0504 04/21/22 0921 04/21/22 1645  BP: (!) 105/58 129/81 (!) 145/84 (!) 133/53  Pulse: 81 87 84 84  Resp: 18 20 18 19   Temp: 98.9 F (37.2 C) 98.1 F (36.7 C) 98.2 F (36.8 C) 98.3 F (36.8 C)  TempSrc: Oral  Oral   SpO2: 96% 97% 100% 94%  Weight:      Height:        Intake/Output Summary (Last 24 hours) at 04/21/2022 1859 Last data filed at 04/21/2022 1700 Gross per 24 hour  Intake 1040 ml  Output --  Net 1040 ml   Filed Weights   04/20/22 1242  Weight: (!) 165.1 kg    Examination:  General exam: NAD.  Minimal tremors. Respiratory system: CTA B.  No wheezes, no crackles, no rhonchi.   Cardiovascular system: Regular rate rhythm no murmurs rubs or gallops.  No JVD.  No lower extremity edema.  Gastrointestinal system: Abdomen is soft, nontender, nondistended, positive bowel sounds.  No rebound.  No guarding.   Central nervous system: Alert and oriented. No focal neurological deficits. Extremities: 1-2+ bilateral lower extremity edema.   Skin: Psoriasis noted on back, lower abdomen.  Psychiatry: Judgement and insight appear normal. Mood & affect appropriate.      Data Reviewed:   CBC: Recent Labs  Lab 04/19/22 1846 04/21/22 0420  WBC 4.9 3.4*  NEUTROABS 3.9 2.2  HGB 12.3* 10.9*  HCT 36.4* 31.7*  MCV 102.8* 100.3*  PLT 118* 110*    Basic Metabolic Panel: Recent Labs  Lab 04/19/22 1846 04/20/22 0344 04/21/22 0420  NA 130* 128* 134*  K 4.2 4.1 3.7  CL 93* 94* 100  CO2 12* 19* 24  GLUCOSE 81 91 105*  BUN 27* 27* 14  CREATININE 5.22* 4.50* 2.35*  CALCIUM 8.0* 8.1* 8.7*  MG  --  1.4* 1.8    GFR: Estimated Creatinine Clearance: 66.2 mL/min (A) (by C-G formula based on SCr of 2.35 mg/dL (H)).  Liver Function Tests: Recent Labs  Lab 04/19/22 1846 04/20/22 0344 04/21/22 0420  AST 80* 73* 72*  ALT 89* 86* 85*  ALKPHOS 65 57 58  BILITOT 1.7* 1.5* 1.6*  PROT 6.3* 5.7* 6.1*  ALBUMIN 3.1* 2.9* 3.0*    CBG: No results for input(s): "GLUCAP" in the last 168 hours.  Recent Results (from the past 240 hour(s))  Culture, blood (routine x 2)     Status: Abnormal (Preliminary result)   Collection Time: 04/19/22  6:58 PM   Specimen: BLOOD  Result Value Ref Range Status   Specimen Description BLOOD RIGHT ANTECUBITAL  Final   Special Requests   Final    BOTTLES DRAWN AEROBIC AND ANAEROBIC Blood Culture adequate volume   Culture  Setup Time   Final    GRAM POSITIVE COCCI IN CLUSTERS IN BOTH AEROBIC AND ANAEROBIC BOTTLES CRITICAL RESULT CALLED TO, READ BACK BY AND VERIFIED WITH: PHARMD AUSTIN PAYTES ON 04/20/22 @ 1520 BY DRT    Culture (A)  Final    STAPHYLOCOCCUS EPIDERMIDIS THE SIGNIFICANCE OF ISOLATING THIS ORGANISM FROM A SINGLE SET OF BLOOD CULTURES WHEN MULTIPLE SETS ARE DRAWN IS UNCERTAIN. PLEASE NOTIFY THE MICROBIOLOGY DEPARTMENT WITHIN ONE WEEK IF SPECIATION AND SENSITIVITIES ARE REQUIRED. Performed at [facility], [address] [place], [place] [address]    Report Status PENDING  Incomplete  Blood Culture ID Panel (Reflexed)     Status: Abnormal   Collection Time: 04/19/22  6:58 PM  Result Value Ref  Range Status   Enterococcus faecalis NOT DETECTED NOT DETECTED Final   Enterococcus Faecium NOT DETECTED NOT DETECTED Final   Listeria monocytogenes NOT DETECTED NOT DETECTED Final   Staphylococcus species DETECTED (A) NOT DETECTED Final    Comment: CRITICAL RESULT CALLED TO, READ BACK BY AND VERIFIED WITH: PHARMD AUSTIN PAYTES ON 04/20/22 @ 1520 BY DRT    Staphylococcus aureus (BCID) NOT DETECTED NOT DETECTED Final   Staphylococcus epidermidis DETECTED (A) NOT DETECTED Final    Comment: CRITICAL RESULT CALLED TO, READ BACK BY AND VERIFIED WITH: PHARMD AUSTIN PAYTES ON 04/20/22 @ 1520 BY DRT    Staphylococcus lugdunensis NOT DETECTED NOT DETECTED Final   Streptococcus species NOT DETECTED NOT DETECTED Final   Streptococcus agalactiae NOT DETECTED NOT DETECTED Final   Streptococcus pneumoniae NOT DETECTED NOT DETECTED Final   Streptococcus pyogenes NOT DETECTED NOT DETECTED Final   A.calcoaceticus-baumannii NOT DETECTED NOT DETECTED Final   Bacteroides fragilis NOT DETECTED NOT DETECTED Final   Enterobacterales NOT DETECTED NOT DETECTED Final   Enterobacter cloacae complex NOT DETECTED NOT DETECTED Final   Escherichia coli NOT DETECTED NOT DETECTED Final   Klebsiella aerogenes NOT DETECTED NOT DETECTED Final   Klebsiella oxytoca NOT DETECTED NOT DETECTED Final   Klebsiella pneumoniae NOT DETECTED NOT DETECTED Final   Proteus species NOT DETECTED NOT DETECTED Final   Salmonella species NOT DETECTED NOT DETECTED Final   Serratia marcescens NOT DETECTED NOT DETECTED Final   Haemophilus influenzae NOT DETECTED NOT DETECTED Final   Neisseria meningitidis NOT DETECTED NOT DETECTED Final   Pseudomonas aeruginosa NOT DETECTED NOT DETECTED Final   Stenotrophomonas maltophilia NOT DETECTED NOT DETECTED Final   Candida albicans NOT DETECTED NOT DETECTED Final   Candida auris NOT DETECTED NOT DETECTED Final   Candida glabrata NOT DETECTED NOT DETECTED Final   Candida krusei NOT DETECTED NOT  DETECTED Final   Candida parapsilosis NOT DETECTED NOT DETECTED Final   Candida tropicalis NOT DETECTED NOT DETECTED Final   Cryptococcus neoformans/gattii NOT DETECTED NOT DETECTED Final   Methicillin resistance mecA/C NOT DETECTED NOT DETECTED Final    Comment: Performed at [facility], [address] [place], [place] [address]  Culture, blood (routine x 2)     Status: None (Preliminary result)   Collection Time: 04/19/22  9:04 PM  Specimen: BLOOD  Result Value Ref Range Status   Specimen Description BLOOD SITE NOT SPECIFIED  Final   Special Requests   Final    BOTTLES DRAWN AEROBIC AND ANAEROBIC Blood Culture adequate volume   Culture   Final    NO GROWTH 2 DAYS Performed at [facility], [address] [place], [place] [address]    Report Status PENDING  Incomplete         Radiology Studies: ECHOCARDIOGRAM COMPLETE  Result Date: 04/21/2022    ECHOCARDIOGRAM REPORT   Patient Name:   Scott Waller Date of Exam: 04/21/2022 Medical Rec #:  [id number]     Height:       74.0 in Accession #:    [id number]    Weight:       364.0 lb Date of Birth:  08-01-1981      BSA:          2.803 m Patient Age:    41 years      BP:           145/84 mmHg Patient Gender: M             HR:           78 bpm. Exam Location:  Inpatient Procedure: 2D Echo, Cardiac Doppler and Color Doppler Indications:    Bilateral leg edema  History:        Patient has no prior history of Echocardiogram examinations.                 CAD; Risk Factors:Hypertension.  Sonographer:    Eduard Roux Referring Phys: [phone] ERIC CHEN  Sonographer Comments: Patient is morbidly obese. Image acquisition challenging due to patient body habitus. IMPRESSIONS  1. Left ventricular ejection fraction, by estimation, is 60 to 65%. The left ventricle has normal function. The left ventricle has no regional wall motion abnormalities. Left ventricular diastolic parameters were normal.  2. Right ventricular systolic function is normal.  The right ventricular size is normal.  3. The mitral valve is normal in structure. No evidence of mitral valve regurgitation. No evidence of mitral stenosis.  4. The aortic valve was not well visualized. There is mild calcification of the aortic valve. Aortic valve regurgitation is not visualized. Aortic valve sclerosis is present, with no evidence of aortic valve stenosis.  5. The inferior vena cava is normal in size with greater than 50% respiratory variability, suggesting right atrial pressure of 3 mmHg. FINDINGS  Left Ventricle: Left ventricular ejection fraction, by estimation, is 60 to 65%. The left ventricle has normal function. The left ventricle has no regional wall motion abnormalities. The left ventricular internal cavity size was normal in size. There is  no left ventricular hypertrophy. Left ventricular diastolic parameters were normal. Right Ventricle: The right ventricular size is normal. No increase in right ventricular wall thickness. Right ventricular systolic function is normal. Left Atrium: Left atrial size was normal in size. Right Atrium: Right atrial size was normal in size. Pericardium: Trivial pericardial effusion is present. The pericardial effusion is posterior to the left ventricle. Mitral Valve: The mitral valve is normal in structure. No evidence of mitral valve regurgitation. No evidence of mitral valve stenosis. Tricuspid Valve: The tricuspid valve is normal in structure. Tricuspid valve regurgitation is trivial. No evidence of tricuspid stenosis. Aortic Valve: The aortic valve was not well visualized. There is mild calcification of the aortic valve. Aortic valve regurgitation is not visualized. Aortic valve sclerosis is present, with no  evidence of aortic valve stenosis. Aortic valve peak gradient measures 11.0 mmHg. Pulmonic Valve: The pulmonic valve was not well visualized. Pulmonic valve regurgitation is not visualized. No evidence of pulmonic stenosis. Aorta: The aortic root is  normal in size and structure. Venous: The inferior vena cava is normal in size with greater than 50% respiratory variability, suggesting right atrial pressure of 3 mmHg. IAS/Shunts: The interatrial septum was not well visualized.  LEFT VENTRICLE PLAX 2D LVIDd:         6.50 cm   Diastology LVIDs:         3.60 cm   LV e' medial:    8.17 cm/s LV PW:         1.40 cm   LV E/e' medial:  13.7 LV IVS:        1.10 cm   LV e' lateral:   14.50 cm/s LVOT diam:     2.10 cm   LV E/e' lateral: 7.7 LV SV:         81 LV SV Index:   29 LVOT Area:     3.46 cm  RIGHT VENTRICLE RV Basal diam:  2.90 cm RV S prime:     16.20 cm/s TAPSE (M-mode): 2.6 cm LEFT ATRIUM             Index LA diam:        4.50 cm 1.61 cm/m LA Vol (A2C):   64.9 ml 23.15 ml/m LA Vol (A4C):   77.4 ml 27.61 ml/m LA Biplane Vol: 72.5 ml 25.86 ml/m  AORTIC VALVE                 PULMONIC VALVE AV Area (Vmax): 2.80 cm     PV Vmax:       1.28 m/s AV Vmax:        166.00 cm/s  PV Peak grad:  6.6 mmHg AV Peak Grad:   11.0 mmHg LVOT Vmax:      134.00 cm/s LVOT Vmean:     78.700 cm/s LVOT VTI:       0.234 m  AORTA Ao Root diam: 3.60 cm Ao Asc diam:  3.60 cm MITRAL VALVE MV Area (PHT): 3.97 cm     SHUNTS MV Decel Time: 191 msec     Systemic VTI:  0.23 m MV E velocity: 112.00 cm/s  Systemic Diam: 2.10 cm MV A velocity: 99.60 cm/s MV E/A ratio:  1.12 Charlton Haws MD Electronically signed by Charlton Haws MD Signature Date/Time: 04/21/2022/11:30:13 AM    Final    US RENAL  Result Date: 04/20/2022 CLINICAL DATA:  [id number] acute renal failure EXAM: RENAL / URINARY TRACT ULTRASOUND COMPLETE COMPARISON:  None Available. FINDINGS: Right Kidney: Renal measurements: 14.1 x 5.4 x 7.9 cm = volume: 314.5 mL. Echogenicity within normal limits. No mass or hydronephrosis visualized. Left Kidney: Renal measurements: 13.7 x 7 x 6.9 cm = volume: 300 mL. Echogenicity within normal limits. No mass or hydronephrosis visualized. Bladder: Appears normal for degree of bladder distention. Other:  Limited visualization of the liver shows the echogenicity of the hepatic parenchyma is increased. IMPRESSION: Unremarkable ultrasound of the kidneys. No hydronephrosis or hydroureter seen. Electronically Signed   By: Marjo Bicker M.D.   On: 04/20/2022 11:57   DG Chest Portable 1 View  Result Date: 04/19/2022 CLINICAL DATA:  Weakness EXAM: PORTABLE CHEST 1 VIEW COMPARISON:  10/13/2018 FINDINGS: The heart size and mediastinal contours are within normal limits. Both lungs are clear. The visualized skeletal structures are  unremarkable. IMPRESSION: No active disease. Electronically Signed   By: Deatra Robinson M.D.   On: 04/19/2022 21:16        Scheduled Meds:  aspirin  81 mg Oral Daily   chlordiazePOXIDE  25 mg Oral TID   Followed by   Melene Muller ON 04/22/2022] chlordiazePOXIDE  25 mg Oral BH-qamhs   Followed by   Melene Muller ON 04/23/2022] chlordiazePOXIDE  25 mg Oral Daily   folic acid  1 mg Oral Daily   heparin  5,000 Units Subcutaneous Q8H   lamoTRIgine  200 mg Oral BID   magnesium oxide  400 mg Oral BID   multivitamin with minerals  1 tablet Oral Daily   nicotine  21 mg Transdermal Daily   rosuvastatin  10 mg Oral Daily   thiamine  100 mg Oral Daily   Or   thiamine  100 mg Intravenous Daily   thiamine  100 mg Intramuscular Once   Continuous Infusions:  sodium chloride 75 mL/hr at 04/21/22 1340     LOS: 1 day    Time spent: 40 minutes    Ramiro Harvest, MD [facility]   To contact the attending provider between 7A-7P or the covering provider during after hours 7P-7A, please log into the web site [email or web address] and access using universal [place] password for that web site. If you do not have the password, please call the hospital operator.  04/21/2022, 6:59 PM

## 2022-04-21 NOTE — Progress Notes (Signed)
[facility] Progress Note    Assessment/ Plan:    AKI - presumably ischemic ATN in setting of hypotension, volume depletion with concomitant ARB and HCTZ therapy.  Kidney function improving as BP is improving and with IVF. Will decrease his IVF rate to 75cc/hr especially as his appetite and BP are improving.  Renal dose meds for eGFR <15 ml/min Avoid nephrotoxic medications including NSAIDs and iodinated intravenous contrast exposure unless the latter is absolutely indicated. Preferred narcotic agents for pain control are hydromorphone, fentanyl, and methadone. Morphine should not be used.  Avoid Baclofen and avoid oral sodium phosphate and magnesium citrate based laxatives / bowel preps.  Continue strict Input and Output monitoring.  Will monitor the patient closely with you and intervene or adjust therapy as indicated by changes in clinical status/labs  Hypotension - likely combination of volume depletion and recent increase in BP meds.  Improving with IVF's and holding meds. If BP continues to improve and/or becomes high, can consider adding back his metoprolol to start with Hyponatremia - likely pseudohyponatremia given normal serum osmolality.  Continue with IVF's and follow. Improving, 371 today Alcoholic hepatitis - LFT's stable Metabolic acidosis - resolved Hypomagnesemia - due to Etoh use.  On oral magnesium oxide oral replacement.  IV magnesium replacement is often ineffective unless give over long periods of time (ie 6-8 hours) due to tubular wasting, especially in Etoh abuse. Mag improved Alcohol withdrawals - under CIWA protocol.  He is interested in treatment program after discharge.  CM will assist with paperwork.  On prn ativan. Per primary service Lower extremity edema - likely due to hypoalbuminemia related to liver injury. Edema stable today  Tobacco abuse - nicotine patch per primary Thrombocytopenia - likely due to liver disease. Stable/no sequelae of  bleeding  Subjective:   No acute events. Patient reports urinating about 4 times overnight (adequate amount each time). He reports that his swelling has actually been improving (initially was not able to fit into his shoes). Discussed w/ wife at bedside as well. Appetite also seems to be improving as well. No chest pain/SOB.   Objective:   BP (!) 145/84 (BP Location: Left Arm)   Pulse 84   Temp 98.2 F (36.8 C) (Oral)   Resp 18   Ht '6\' 2"'  (1.88 m)   Wt (!) 165.1 kg   SpO2 100%   BMI 46.73 kg/m   Intake/Output Summary (Last 24 hours) at 04/21/2022 1151 Last data filed at 04/20/2022 1800 Gross per 24 hour  Intake 1432.06 ml  Output --  Net 1432.06 ml   Weight change:   Physical Exam: Gen:NAD CVS:S1S2, RRR Resp:CTA B/L, normal WOB, b/l chest expansion IRC:VELFY, soft, nt/nd Ext:1-2+ pitting edema b/l LE's Neuro: nonfocal, awake/alert, moves all ext spontaneously, speech clear and coherent  Imaging: ECHOCARDIOGRAM COMPLETE  Result Date: 04/21/2022    ECHOCARDIOGRAM REPORT   Patient Name:   Roni Kutzer Date of Exam: 04/21/2022 Medical Rec #:  [id number]     Height:       74.0 in Accession #:    [id number]    Weight:       364.0 lb Date of Birth:  Jul 23, 1981      BSA:          2.803 m Patient Age:    41 years      BP:           145/84 mmHg Patient Gender: M  HR:           78 bpm. Exam Location:  Inpatient Procedure: 2D Echo, Cardiac Doppler and Color Doppler Indications:    Bilateral leg edema  History:        Patient has no prior history of Echocardiogram examinations.                 CAD; Risk Factors:Hypertension.  Sonographer:    Jefferey Pica Referring Phys: [phone] ERIC CHEN  Sonographer Comments: Patient is morbidly obese. Image acquisition challenging due to patient body habitus. IMPRESSIONS  1. Left ventricular ejection fraction, by estimation, is 60 to 65%. The left ventricle has normal function. The left ventricle has no regional wall motion abnormalities. Left  ventricular diastolic parameters were normal.  2. Right ventricular systolic function is normal. The right ventricular size is normal.  3. The mitral valve is normal in structure. No evidence of mitral valve regurgitation. No evidence of mitral stenosis.  4. The aortic valve was not well visualized. There is mild calcification of the aortic valve. Aortic valve regurgitation is not visualized. Aortic valve sclerosis is present, with no evidence of aortic valve stenosis.  5. The inferior vena cava is normal in size with greater than 50% respiratory variability, suggesting right atrial pressure of 3 mmHg. FINDINGS  Left Ventricle: Left ventricular ejection fraction, by estimation, is 60 to 65%. The left ventricle has normal function. The left ventricle has no regional wall motion abnormalities. The left ventricular internal cavity size was normal in size. There is  no left ventricular hypertrophy. Left ventricular diastolic parameters were normal. Right Ventricle: The right ventricular size is normal. No increase in right ventricular wall thickness. Right ventricular systolic function is normal. Left Atrium: Left atrial size was normal in size. Right Atrium: Right atrial size was normal in size. Pericardium: Trivial pericardial effusion is present. The pericardial effusion is posterior to the left ventricle. Mitral Valve: The mitral valve is normal in structure. No evidence of mitral valve regurgitation. No evidence of mitral valve stenosis. Tricuspid Valve: The tricuspid valve is normal in structure. Tricuspid valve regurgitation is trivial. No evidence of tricuspid stenosis. Aortic Valve: The aortic valve was not well visualized. There is mild calcification of the aortic valve. Aortic valve regurgitation is not visualized. Aortic valve sclerosis is present, with no evidence of aortic valve stenosis. Aortic valve peak gradient measures 11.0 mmHg. Pulmonic Valve: The pulmonic valve was not well visualized. Pulmonic  valve regurgitation is not visualized. No evidence of pulmonic stenosis. Aorta: The aortic root is normal in size and structure. Venous: The inferior vena cava is normal in size with greater than 50% respiratory variability, suggesting right atrial pressure of 3 mmHg. IAS/Shunts: The interatrial septum was not well visualized.  LEFT VENTRICLE PLAX 2D LVIDd:         6.50 cm   Diastology LVIDs:         3.60 cm   LV e' medial:    8.17 cm/s LV PW:         1.40 cm   LV E/e' medial:  13.7 LV IVS:        1.10 cm   LV e' lateral:   14.50 cm/s LVOT diam:     2.10 cm   LV E/e' lateral: 7.7 LV SV:         81 LV SV Index:   29 LVOT Area:     3.46 cm  RIGHT VENTRICLE RV Basal diam:  2.90 cm RV S  prime:     16.20 cm/s TAPSE (M-mode): 2.6 cm LEFT ATRIUM             Index LA diam:        4.50 cm 1.61 cm/m LA Vol (A2C):   64.9 ml 23.15 ml/m LA Vol (A4C):   77.4 ml 27.61 ml/m LA Biplane Vol: 72.5 ml 25.86 ml/m  AORTIC VALVE                 PULMONIC VALVE AV Area (Vmax): 2.80 cm     PV Vmax:       1.28 m/s AV Vmax:        166.00 cm/s  PV Peak grad:  6.6 mmHg AV Peak Grad:   11.0 mmHg LVOT Vmax:      134.00 cm/s LVOT Vmean:     78.700 cm/s LVOT VTI:       0.234 m  AORTA Ao Root diam: 3.60 cm Ao Asc diam:  3.60 cm MITRAL VALVE MV Area (PHT): 3.97 cm     SHUNTS MV Decel Time: 191 msec     Systemic VTI:  0.23 m MV E velocity: 112.00 cm/s  Systemic Diam: 2.10 cm MV A velocity: 99.60 cm/s MV E/A ratio:  1.12 Jenkins Rouge MD Electronically signed by Jenkins Rouge MD Signature Date/Time: 04/21/2022/11:30:13 AM    Final    US RENAL  Result Date: 04/20/2022 CLINICAL DATA:  [id number] acute renal failure EXAM: RENAL / URINARY TRACT ULTRASOUND COMPLETE COMPARISON:  None Available. FINDINGS: Right Kidney: Renal measurements: 14.1 x 5.4 x 7.9 cm = volume: 314.5 mL. Echogenicity within normal limits. No mass or hydronephrosis visualized. Left Kidney: Renal measurements: 13.7 x 7 x 6.9 cm = volume: 300 mL. Echogenicity within normal limits. No  mass or hydronephrosis visualized. Bladder: Appears normal for degree of bladder distention. Other: Limited visualization of the liver shows the echogenicity of the hepatic parenchyma is increased. IMPRESSION: Unremarkable ultrasound of the kidneys. No hydronephrosis or hydroureter seen. Electronically Signed   By: Frazier Richards M.D.   On: 04/20/2022 11:57   DG Chest Portable 1 View  Result Date: 04/19/2022 CLINICAL DATA:  Weakness EXAM: PORTABLE CHEST 1 VIEW COMPARISON:  10/13/2018 FINDINGS: The heart size and mediastinal contours are within normal limits. Both lungs are clear. The visualized skeletal structures are unremarkable. IMPRESSION: No active disease. Electronically Signed   By: Ulyses Jarred M.D.   On: 04/19/2022 21:16    Labs: BMET Recent Labs  Lab 04/19/22 1846 04/20/22 0344 04/21/22 0420  NA 130* 128* 134*  K 4.2 4.1 3.7  CL 93* 94* 100  CO2 12* 19* 24  GLUCOSE 81 91 105*  BUN 27* 27* 14  CREATININE 5.22* 4.50* 2.35*  CALCIUM 8.0* 8.1* 8.7*   CBC Recent Labs  Lab 04/19/22 1846 04/21/22 0420  WBC 4.9 3.4*  NEUTROABS 3.9 2.2  HGB 12.3* 10.9*  HCT 36.4* 31.7*  MCV 102.8* 100.3*  PLT 118* 110*    Medications:     aspirin  81 mg Oral Daily   chlordiazePOXIDE  25 mg Oral TID   Followed by   Derrill Memo ON 04/22/2022] chlordiazePOXIDE  25 mg Oral BH-qamhs   Followed by   Derrill Memo ON 04/23/2022] chlordiazePOXIDE  25 mg Oral Daily   folic acid  1 mg Oral Daily   heparin  5,000 Units Subcutaneous Q8H   magnesium oxide  400 mg Oral BID   multivitamin with minerals  1 tablet Oral Daily   nicotine  21  mg Transdermal Daily   rosuvastatin  10 mg Oral Daily   thiamine  100 mg Oral Daily   Or   thiamine  100 mg Intravenous Daily   thiamine  100 mg Intramuscular Once      Gean Quint, MD [facility] 04/21/2022, 11:51 AM

## 2022-04-21 NOTE — Hospital Course (Signed)
HPI per Dr. [name] 41 yo WM hx of psoriatic arthritis, morbid obesity, HTN, alcohol abuse, presents to ER with hypotension and dizziness from PCP office. pt seen by PCP beginning of July 2023 for annual physical. HCTZ added and lopressor dose increased. wife states she thought it was strange because pt's BP was not elevated during office visit.   since medication changes, pt has felt lethargic and more tired.  pt noted to have dark colored urine for the last 1-2 days.  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 SBP was in the 90s.   Coincidentally, pt stopped drinking etoh about 24 hours ago. pt had been drinking 1/5 of liquor per day for the last month. wife and pt had inquired about alcohol treatment programs recently.   pt went to PCP office where SBP noted to be in the 70s. pt referred to ER.   on arrival to ER, BP 60/34.  pt given IVF. SCr noted to be >5. was normal on annual labs in july 2023.   TRH called for admission due to AKI and acute etoh withdrawal.    ED Course: hypotensive to the 70s on arrival. Given Ivf. Pt has urinated since IVF given.

## 2022-04-22 DIAGNOSIS — I959 Hypotension, unspecified: Secondary | ICD-10-CM

## 2022-04-22 LAB — COMPREHENSIVE METABOLIC PANEL
ALT: 95 U/L — ABNORMAL HIGH (ref 0–44)
AST: 85 U/L — ABNORMAL HIGH (ref 15–41)
Albumin: 3.1 g/dL — ABNORMAL LOW (ref 3.5–5.0)
Alkaline Phosphatase: 66 U/L (ref 38–126)
Anion gap: 12 (ref 5–15)
BUN: 8 mg/dL (ref 6–20)
CO2: 24 mmol/L (ref 22–32)
Calcium: 8.4 mg/dL — ABNORMAL LOW (ref 8.9–10.3)
Chloride: 97 mmol/L — ABNORMAL LOW (ref 98–111)
Creatinine, Ser: 1.44 mg/dL — ABNORMAL HIGH (ref 0.61–1.24)
GFR, Estimated: >60 mL/min (ref >60)
Glucose, Bld: 101 mg/dL — ABNORMAL HIGH (ref 70–99)
Potassium: 3.5 mmol/L (ref 3.5–5.1)
Sodium: 133 mmol/L — ABNORMAL LOW (ref 135–145)
Total Bilirubin: 1.1 mg/dL (ref 0.3–1.2)
Total Protein: 6.6 g/dL (ref 6.5–8.1)

## 2022-04-22 LAB — CBC WITH DIFFERENTIAL/PLATELET
Abs Immature Granulocytes: 0.04 10*3/uL (ref 0.00–0.07)
Basophils Absolute: 0 10*3/uL (ref 0.0–0.1)
Basophils Relative: 1 %
Eosinophils Absolute: 0 10*3/uL (ref 0.0–0.5)
Eosinophils Relative: 1 %
HCT: 35.6 % — ABNORMAL LOW (ref 39.0–52.0)
Hemoglobin: 12.2 g/dL — ABNORMAL LOW (ref 13.0–17.0)
Immature Granulocytes: 1 %
Lymphocytes Relative: 27 %
Lymphs Abs: 1 10*3/uL (ref 0.7–4.0)
MCH: 34.3 pg — ABNORMAL HIGH (ref 26.0–34.0)
MCHC: 34.3 g/dL (ref 30.0–36.0)
MCV: 100 fL (ref 80.0–100.0)
Monocytes Absolute: 0.6 10*3/uL (ref 0.1–1.0)
Monocytes Relative: 15 %
Neutro Abs: 2.1 10*3/uL (ref 1.7–7.7)
Neutrophils Relative %: 55 %
Platelets: 117 10*3/uL — ABNORMAL LOW (ref 150–400)
RBC: 3.56 MIL/uL — ABNORMAL LOW (ref 4.22–5.81)
RDW: 15.2 % (ref 11.5–15.5)
WBC: 3.8 10*3/uL — ABNORMAL LOW (ref 4.0–10.5)
nRBC: 0 % (ref 0.0–0.2)

## 2022-04-22 LAB — CULTURE, BLOOD (ROUTINE X 2): Special Requests: ADEQUATE

## 2022-04-22 LAB — MAGNESIUM: Magnesium: 1.3 mg/dL — ABNORMAL LOW (ref 1.7–2.4)

## 2022-04-22 MED ORDER — METOPROLOL TARTRATE 12.5 MG HALF TABLET: 12.5000 mg | ORAL_TABLET | Freq: Two times a day (BID) | ORAL | Status: DC

## 2022-04-22 MED ORDER — METOPROLOL TARTRATE 12.5 MG HALF TABLET
12.5000 mg | ORAL_TABLET | Freq: Two times a day (BID) | ORAL | Status: DC
Start: 2022-04-22 — End: 2022-04-22
  Administered 2022-04-22: 12.5 mg via ORAL
  Filled 2022-04-22: qty 1

## 2022-04-22 MED ORDER — THIAMINE HCL 100 MG PO TABS: 100.0000 mg | ORAL_TABLET | Freq: Every day | ORAL | Status: DC

## 2022-04-22 MED ORDER — ROSUVASTATIN CALCIUM 20 MG PO TABS: 10.0000 mg | ORAL_TABLET | Freq: Every day | ORAL | 1 refills | Status: DC

## 2022-04-22 MED ORDER — FOLIC ACID 1 MG PO TABS: 1.0000 mg | ORAL_TABLET | Freq: Every day | ORAL | Status: DC

## 2022-04-22 MED ORDER — ASPIRIN 81 MG PO CHEW
81.0000 mg | CHEWABLE_TABLET | Freq: Every day | ORAL | 3 refills | Status: DC
Start: 2022-04-22 — End: 2022-05-12

## 2022-04-22 MED ORDER — MAGNESIUM SULFATE 4 GM/100ML IV SOLN
4.0000 g | Freq: Once | INTRAVENOUS | Status: AC
  Administered 2022-04-22: 4 g via INTRAVENOUS
  Filled 2022-04-22: qty 100

## 2022-04-22 MED ORDER — METOPROLOL TARTRATE 25 MG PO TABS: 25.0000 mg | ORAL_TABLET | Freq: Two times a day (BID) | ORAL | Status: DC

## 2022-04-22 MED ORDER — POTASSIUM CHLORIDE CRYS ER 20 MEQ PO TBCR
40.0000 meq | EXTENDED_RELEASE_TABLET | Freq: Once | ORAL | Status: AC
  Administered 2022-04-22: 40 meq via ORAL
  Filled 2022-04-22: qty 2

## 2022-04-22 MED ORDER — METOPROLOL TARTRATE 12.5 MG HALF TABLET
12.5000 mg | ORAL_TABLET | Freq: Once | ORAL | Status: DC
Start: 2022-04-22 — End: 2022-04-22

## 2022-04-22 MED ORDER — MAGNESIUM OXIDE -MG SUPPLEMENT 400 (240 MG) MG PO TABS: 800.0000 mg | ORAL_TABLET | Freq: Two times a day (BID) | ORAL | 0 refills | Status: AC

## 2022-04-22 NOTE — Discharge Instructions (Signed)
                   Outpatient Substance Abuse  Treatment- uninsured  Narcotics Anonymous 24-HOUR HELPLINE Pre-recorded for Meeting Schedules [facility] [phone]  [email or web address] ALCOHOLICS ANONYMOUS  [place]  Answering Service [phone] Please Note: All [place] Meetings are Non-smoking [email or web address]  [facility] Drug Services -  Insurance: Medicaid /State funding/private insurance Methadone, suboxone/Intensive outpatient  [place] [phone] Fax: [phone] [address], [place], [place], [address] [place] [phone] Fax: [phone]    [address], [place], [place], [address] (Serves [place], [place], [place], [place], [place], [place], [place], [place]) [facility] [email or web address] Accepts State funding/Medicaid Transitional housing, Intensive Outpatient Treatment, Outpatient treatment, Veterans Services  Phone: [phone] Fax: [phone] Address: [address], [place] [address]   [facility] ([email or web address]://[email or web address]/) Insurance: Medicaid Case Management, Community Support Team, Medication Management, Outpatient Therapy, Psychosocial Rehabilitation, Substance Abuse Intensive Outpatient  Phone: [phone] Fax: [phone] [address], [place], [place], [address]  [facility] Medicaid, most private insurance providers Types of Program: Individual/Group Therapy, Substance Abuse Treatment  Phone: [place] [phone] Fax: [phone] [address], [place], [place], [address] [place] [phone] [address], [place], [place], [address]  [facility]  Medicaid Types of Program: SAIOP  Phone: [phone] Fax: [phone] [address], [place], [place], [address] [facility] Medicaid/state funds Crisis line [phone] [place]-[address] [phone] [place] -[address] [phone]  [facility] [address];  [place], [place] [address] [phone]  Substance Abuse Intensive Outpatient Program [facility] [address] [place], [place] [address] [phone]- Substance abuse treatment  [facility]  Insurance: [place] Medicaid, BCBS, sliding scale Types of Program: substance abuse treatment, transportation assistance Phone: [phone] Fax: [phone] Address: [address], [place] [address] [facility] ([email or web address]/) Insurance: UHC, BCBS, Aetna, Medicaid of [place] Program: addiction counseling, detoxification,  Phone: [phone]  Fax: [phone] Address: [address], [place] [address]  [facility] (statewide facilities/programs) [address] (Medicaid/state funds) [place], [place] [address]                      [facility] [phone] [place]- [phone] [place]- [phone] [facility] (2 Locations) (Medicaid/state funds) --[address]  walk in 8:30-12 and 1-2:30 , [id number]   PH- [phone] --[address] [place], [place] [address]  [phone] walk in 8:30-12 and 2-3:30  [facility] state funds/medicaid [address]  [place], [place] [address] [phone] [facility] (Suboxone clinic) Medicaid/state funds  [address]  [place], [place] [address] [phone]   [facility]  [place]-[address], [place], [place] [address]  [phone] (24 hours) [place]- [address] [place], [place] [address]  [phone] (24 hours) [place]- [address] [place] [phone] [place]- [address] [place] [phone] [place]- [address] [place] [address] [place] [phone] [facility]- Medicaid and state funds  [place]- [address] [place], [place] [address] [phone] (24 hours) [place]- [address] [place], [place]  [address] [phone] [place]- [address] [place], [place] [address] [phone] (24 hours) [place] [address] [place], [place]  [address] [phone] [place]- [address]  [phone]    

## 2022-04-22 NOTE — Discharge Summary (Signed)
Physician Discharge Summary  Scott Waller ZOX:[id number] DOB: 1981-01-03 DOA: 04/19/2022  PCP: April Manson, NP  Admit date: 04/19/2022 Discharge date: 04/22/2022  Time spent: 60 minutes  Recommendations for Outpatient Follow-up:  Follow-up with April Manson, NP in 1 to 2 weeks.  On follow-up patient need a comprehensive metabolic profile, magnesium level, CBC done to follow-up on electrolytes, renal function, H&H. Follow-up with Dr. Thedore Mins, nephrology in 2 weeks.  Office will call with appointment time.   Discharge Diagnoses:  Principal Problem:   Acute kidney injury (HCC) Active Problems:   Alcohol abuse with withdrawal without complication (HCC)   Essential hypertension   CAD (coronary artery disease)   Tobacco abuse   Hypotension   Chronic pain   Morbid obesity with BMI of 45.0-49.9, adult (HCC)   Psoriatic arthritis (HCC)   Generalized anxiety disorder   Hypomagnesemia   Transaminitis   AKI (acute kidney injury) (HCC)   Discharge Condition: Stable and improved  Diet recommendation: Heart healthy  Filed Weights   04/20/22 1242 04/22/22 0500  Weight: (!) 165.1 kg (!) 164 kg    History of present illness:  HPI per Dr. Imogene Waller 41 yo WM hx of psoriatic arthritis, morbid obesity, HTN, alcohol abuse, presents to ER with hypotension and dizziness from PCP office. pt seen by PCP beginning of July 2023 for annual physical. HCTZ added and lopressor dose increased. wife stated she thought it was strange because pt's BP was not elevated during office visit.   since medication changes, pt has felt lethargic and more tired.  pt noted to have dark colored urine for the last 1-2 days.  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 SBP was in the 90s.   Coincidentally, pt stopped drinking etoh about 24 hours ago. pt had been drinking 1/5 of liquor per day for the last month. wife and pt had inquired about alcohol treatment programs recently.   pt went to PCP office where  SBP noted to be in the 70s. pt referred to ER.   on arrival to ER, BP 60/34.  pt given IVF. SCr noted to be >5. was normal on annual labs in july 2023.   TRH called for admission due to AKI and acute etoh withdrawal.    ED Course: hypotensive to the 70s on arrival. Given Ivf. Pt has urinated since IVF given. Hospital Course:   Assessment and Plan: * Acute kidney injury (HCC) - Felt secondary to ischemic ATN in the setting of hypotension, volume depletion in the setting of concomitant ARB HCTZ therapy.   -Patient was noted to be hypotensive on admission and hydrated aggressively with IV fluids. -Antihypertensive medications held during the hospitalization. -Renal ultrasound done unremarkable with no hydronephrosis or hydroureter seen. -Nephrology consulted on the patient during the hospitalization and followed the patient throughout the hospitalization. -Urine output improved during the hospitalization, renal function trended down such that by day of discharge creatinine was down to 1.44 from 5.22 on admission. -Patient's ARB HCTZ will be discontinued on discharge. -Outpatient follow-up with nephrology.  Alcohol abuse with withdrawal without complication (HCC) - Patient drinks at least 1/5 of liquor a day.  - Patient and wife are interested in the patient going to residential drug/alcohol treatment program after discharge.  -TOC consulted to help patient and family with outpatient residential alcohol treatment program.  -Patient started on Librium detox protocol in addition to IV Ativan as needed, thiamine, multivitamin, folic acid and improved during the hospitalization with no significant  alcohol withdrawal symptoms noted.   -Patient anxious to be discharged and as such was discharged on day 4/5 of Librium detox protocol.   -Outpatient follow-up with PCP.  Psoriatic arthritis (HCC) Chronic. Outpatient follow-up.  Morbid obesity with BMI of 45.0-49.9, adult (HCC) Chronic.   BMI of  greater than 40.   Weight is 176.9 kg height is 6 feet 3 inches.  Chronic pain Patient had oxycodone at home for his psoriatic arthritis.  -Patient maintained on IV Dilaudid during the hospitalization for pain management due to AKI.   -Chronic home pain regimen will be resumed on discharge.  Hypotension Hypotension noted on admission due to recent increase in his blood pressure medications. -Blood pressure improved with hydration during the hospitalization.  -Patient received a dose of IV albumin.  -Antihypertensives were held during most of the hospitalization and home regimen Lopressor resumed on day of discharge.  -Hypotension had resolved by day of discharge.    Tobacco abuse Patient smokes at least a pack a day.  -Patient placed on nicotine patch.     CAD (coronary artery disease) Stable.   Patient maintained on home regimen Crestor 10 mg daily, aspirin 81 mg daily during the hospitalization. -Beta-blocker initially held as well as ARB and HCTZ on presentation due to hypotension. -Half home dose beta-blocker resumed on day of discharge and patient be discharged home on home regimen beta-blocker. -ARB/HCTZ will be discontinued on discharge due to presentation with acute kidney injury. -Outpatient follow-up with PCP and primary cardiologist as previously scheduled.  Essential hypertension - Patient presented with hypotension, requiring fluid resuscitation and IV fluids.   -Antihypertensive medications were held during most of the hospitalization.   -Patient's regimen of Lopressor was resumed at half his home dose on day of discharge and patient was discharged back on home regimen.   -Patient's ARB/HCTZ will be discontinued on discharge.  -Outpatient follow-up with PCP.   Transaminitis - Likely secondary to chronic alcohol use. -Acute hepatitis panel negative. -Outpatient follow-up.  Hypomagnesemia - Repleted. -Patient will be discharged on magnesium oxide 800 mg twice daily  x7 days. -Outpatient follow-up for repeat labs and further management.  Generalized anxiety disorder - Remained stable on Librium detox protocol during the hospitalization.   -Home regimen anxiolytics were held during the hospitalization and will be resumed on discharge.         Procedures: Chest x-ray 04/19/2022 Renal ultrasound 04/20/2022 2D echo 04/21/2022  Consultations: Nephrology: Dr. Arrie Aran 04/20/2022  Discharge Exam: Vitals:   04/22/22 0554 04/22/22 0943  BP: (!) 144/82 (!) 160/70  Pulse: 76 84  Resp: 18 19  Temp: 98.3 F (36.8 C) 98.4 F (36.9 C)  SpO2: 97% 97%    General: NAD Cardiovascular: Regular rate rhythm no murmurs rubs or gallops.  No JVD.  1-2+ bilateral lower extremity edema Respiratory: Clear to auscultation bilaterally.  No wheezes, no crackles, no rhonchi.  Discharge Instructions   Discharge Instructions     Diet - low sodium heart healthy   Complete by: As directed    Increase activity slowly   Complete by: As directed       Allergies as of 04/22/2022       Reactions   Desvenlafaxine Anaphylaxis   High BP        Medication List     STOP taking these medications    fluticasone 50 MCG/ACT nasal spray Commonly known as: FLONASE   HYDROcodone-acetaminophen 10-325 MG tablet Commonly known as: NORCO   losartan-hydrochlorothiazide 100-25 MG tablet  Commonly known as: HYZAAR   methocarbamol 500 MG tablet Commonly known as: ROBAXIN   metoprolol succinate 50 MG 24 hr tablet Commonly known as: TOPROL-XL   valsartan-hydrochlorothiazide 320-25 MG tablet Commonly known as: DIOVAN-HCT       TAKE these medications    albuterol (2.5 MG/3ML) 0.083% nebulizer solution Commonly known as: PROVENTIL Take 3 mLs (2.5 mg total) by nebulization every 4 (four) hours as needed for wheezing or shortness of breath.   ALPRAZolam 0.5 MG tablet Commonly known as: XANAX Take 0.5 mg by mouth 3 (three) times daily as needed for anxiety.    aspirin 81 MG chewable tablet Chew 1 tablet (81 mg total) by mouth daily.   Clobetasol Propionate 0.05 % external spray Commonly known as: TEMOVATE Apply 1 application topically 2 (two) times daily as needed.   Cosentyx Sensoready (300 MG) 150 MG/ML Soaj Generic drug: Secukinumab (300 MG Dose) Inject 150 mg into the skin every 30 (thirty) days.   folic acid 1 MG tablet Commonly known as: FOLVITE Take 1 tablet (1 mg total) by mouth daily. Start taking on: April 23, 2022   lamoTRIgine 200 MG tablet Commonly known as: LAMICTAL Take 200 mg by mouth 2 (two) times daily.   magnesium oxide 400 (240 Mg) MG tablet Commonly known as: MAG-OX Take 2 tablets (800 mg total) by mouth 2 (two) times daily for 7 days.   metoprolol tartrate 25 MG tablet Commonly known as: LOPRESSOR Take 1 tablet (25 mg total) by mouth 2 (two) times daily.   MULTIVITAMIN MEN PO Take 1 tablet by mouth daily.   nicotine 21 mg/24hr patch Commonly known as: NICODERM CQ - dosed in mg/24 hours Place 1 patch (21 mg total) onto the skin daily.   nitroGLYCERIN 0.4 MG SL tablet Commonly known as: NITROSTAT Place 0.4 mg under the tongue every 5 (five) minutes x 3 doses as needed. If no improvement, call 911   ondansetron 8 MG disintegrating tablet Commonly known as: Zofran ODT Take 1 tablet (8 mg total) by mouth every 8 (eight) hours as needed for nausea or vomiting.   Oxycodone HCl 10 MG Tabs Take 10 mg by mouth 4 (four) times daily as needed (pain).   rosuvastatin 20 MG tablet Commonly known as: CRESTOR Take 0.5 tablets (10 mg total) by mouth daily.   thiamine 100 MG tablet Take 1 tablet (100 mg total) by mouth daily. Start taking on: April 23, 2022   traZODone 150 MG tablet Commonly known as: DESYREL Take 150 mg by mouth at bedtime.       Allergies  Allergen Reactions   Desvenlafaxine Anaphylaxis    High BP    Follow-up Information     April Manson, NP. Schedule an appointment as soon as  possible for a visit in 2 week(s).   Specialty: Family Medicine Why: f/u in 1-2 weeks Contact information: [address] [place] [address] [phone]         Corky Crafts, MD .   Specialties: Cardiology, Radiology, Interventional Cardiology Contact information: [address] [place] [address] [phone]         Anthony Sar, MD. Schedule an appointment as soon as possible for a visit in 2 week(s).   Specialty: Nephrology Why: office will call with appointment time. Contact information: [address] [place] [address] [phone]                  The results of  significant diagnostics from this hospitalization (including imaging, microbiology, ancillary and laboratory) are listed below for reference.    Significant Diagnostic Studies: ECHOCARDIOGRAM COMPLETE  Result Date: 04/21/2022    ECHOCARDIOGRAM REPORT   Patient Name:   Marvie Cliburn Date of Exam: 04/21/2022 Medical Rec #:  [id number]     Height:       74.0 in Accession #:    [id number]    Weight:       364.0 lb Date of Birth:  26-May-1981      BSA:          2.803 m Patient Age:    41 years      BP:           145/84 mmHg Patient Gender: M             HR:           78 bpm. Exam Location:  Inpatient Procedure: 2D Echo, Cardiac Doppler and Color Doppler Indications:    Bilateral leg edema  History:        Patient has no prior history of Echocardiogram examinations.                 CAD; Risk Factors:Hypertension.  Sonographer:    Eduard Roux Referring Phys: [phone] ERIC CHEN  Sonographer Comments: Patient is morbidly obese. Image acquisition challenging due to patient body habitus. IMPRESSIONS  1. Left ventricular ejection fraction, by estimation, is 60 to 65%. The left ventricle has normal function. The left ventricle has no regional wall motion abnormalities. Left ventricular diastolic parameters were normal.  2. Right ventricular systolic function is normal. The right  ventricular size is normal.  3. The mitral valve is normal in structure. No evidence of mitral valve regurgitation. No evidence of mitral stenosis.  4. The aortic valve was not well visualized. There is mild calcification of the aortic valve. Aortic valve regurgitation is not visualized. Aortic valve sclerosis is present, with no evidence of aortic valve stenosis.  5. The inferior vena cava is normal in size with greater than 50% respiratory variability, suggesting right atrial pressure of 3 mmHg. FINDINGS  Left Ventricle: Left ventricular ejection fraction, by estimation, is 60 to 65%. The left ventricle has normal function. The left ventricle has no regional wall motion abnormalities. The left ventricular internal cavity size was normal in size. There is  no left ventricular hypertrophy. Left ventricular diastolic parameters were normal. Right Ventricle: The right ventricular size is normal. No increase in right ventricular wall thickness. Right ventricular systolic function is normal. Left Atrium: Left atrial size was normal in size. Right Atrium: Right atrial size was normal in size. Pericardium: Trivial pericardial effusion is present. The pericardial effusion is posterior to the left ventricle. Mitral Valve: The mitral valve is normal in structure. No evidence of mitral valve regurgitation. No evidence of mitral valve stenosis. Tricuspid Valve: The tricuspid valve is normal in structure. Tricuspid valve regurgitation is trivial. No evidence of tricuspid stenosis. Aortic Valve: The aortic valve was not well visualized. There is mild calcification of the aortic valve. Aortic valve regurgitation is not visualized. Aortic valve sclerosis is present, with no evidence of aortic valve stenosis. Aortic valve peak gradient measures 11.0 mmHg. Pulmonic Valve: The pulmonic valve was not well visualized. Pulmonic valve regurgitation is not visualized. No evidence of pulmonic stenosis. Aorta: The aortic root is normal in  size and structure. Venous: The inferior vena cava is normal in size with greater than 50% respiratory variability, suggesting  right atrial pressure of 3 mmHg. IAS/Shunts: The interatrial septum was not well visualized.  LEFT VENTRICLE PLAX 2D LVIDd:         6.50 cm   Diastology LVIDs:         3.60 cm   LV e' medial:    8.17 cm/s LV PW:         1.40 cm   LV E/e' medial:  13.7 LV IVS:        1.10 cm   LV e' lateral:   14.50 cm/s LVOT diam:     2.10 cm   LV E/e' lateral: 7.7 LV SV:         81 LV SV Index:   29 LVOT Area:     3.46 cm  RIGHT VENTRICLE RV Basal diam:  2.90 cm RV S prime:     16.20 cm/s TAPSE (M-mode): 2.6 cm LEFT ATRIUM             Index LA diam:        4.50 cm 1.61 cm/m LA Vol (A2C):   64.9 ml 23.15 ml/m LA Vol (A4C):   77.4 ml 27.61 ml/m LA Biplane Vol: 72.5 ml 25.86 ml/m  AORTIC VALVE                 PULMONIC VALVE AV Area (Vmax): 2.80 cm     PV Vmax:       1.28 m/s AV Vmax:        166.00 cm/s  PV Peak grad:  6.6 mmHg AV Peak Grad:   11.0 mmHg LVOT Vmax:      134.00 cm/s LVOT Vmean:     78.700 cm/s LVOT VTI:       0.234 m  AORTA Ao Root diam: 3.60 cm Ao Asc diam:  3.60 cm MITRAL VALVE MV Area (PHT): 3.97 cm     SHUNTS MV Decel Time: 191 msec     Systemic VTI:  0.23 m MV E velocity: 112.00 cm/s  Systemic Diam: 2.10 cm MV A velocity: 99.60 cm/s MV E/A ratio:  1.12 Charlton Haws MD Electronically signed by Charlton Haws MD Signature Date/Time: 04/21/2022/11:30:13 AM    Final    US RENAL  Result Date: 04/20/2022 CLINICAL DATA:  [id number] acute renal failure EXAM: RENAL / URINARY TRACT ULTRASOUND COMPLETE COMPARISON:  None Available. FINDINGS: Right Kidney: Renal measurements: 14.1 x 5.4 x 7.9 cm = volume: 314.5 mL. Echogenicity within normal limits. No mass or hydronephrosis visualized. Left Kidney: Renal measurements: 13.7 x 7 x 6.9 cm = volume: 300 mL. Echogenicity within normal limits. No mass or hydronephrosis visualized. Bladder: Appears normal for degree of bladder distention. Other: Limited  visualization of the liver shows the echogenicity of the hepatic parenchyma is increased. IMPRESSION: Unremarkable ultrasound of the kidneys. No hydronephrosis or hydroureter seen. Electronically Signed   By: Marjo Bicker M.D.   On: 04/20/2022 11:57   DG Chest Portable 1 View  Result Date: 04/19/2022 CLINICAL DATA:  Weakness EXAM: PORTABLE CHEST 1 VIEW COMPARISON:  10/13/2018 FINDINGS: The heart size and mediastinal contours are within normal limits. Both lungs are clear. The visualized skeletal structures are unremarkable. IMPRESSION: No active disease. Electronically Signed   By: Deatra Robinson M.D.   On: 04/19/2022 21:16    Microbiology: Recent Results (from the past 240 hour(s))  Culture, blood (routine x 2)     Status: Abnormal (Preliminary result)   Collection Time: 04/19/22  6:58 PM   Specimen: BLOOD  Result Value  Ref Range Status   Specimen Description BLOOD RIGHT ANTECUBITAL  Final   Special Requests   Final    BOTTLES DRAWN AEROBIC AND ANAEROBIC Blood Culture adequate volume   Culture  Setup Time   Final    GRAM POSITIVE COCCI IN CLUSTERS IN BOTH AEROBIC AND ANAEROBIC BOTTLES CRITICAL RESULT CALLED TO, READ BACK BY AND VERIFIED WITH: PHARMD AUSTIN PAYTES ON 04/20/22 @ 1520 BY DRT    Culture (A)  Final    STAPHYLOCOCCUS EPIDERMIDIS THE SIGNIFICANCE OF ISOLATING THIS ORGANISM FROM A SINGLE SET OF BLOOD CULTURES WHEN MULTIPLE SETS ARE DRAWN IS UNCERTAIN. PLEASE NOTIFY THE MICROBIOLOGY DEPARTMENT WITHIN ONE WEEK IF SPECIATION AND SENSITIVITIES ARE REQUIRED. Performed at [facility], [address] [place], [place] [address]    Report Status PENDING  Incomplete  Blood Culture ID Panel (Reflexed)     Status: Abnormal   Collection Time: 04/19/22  6:58 PM  Result Value Ref Range Status   Enterococcus faecalis NOT DETECTED NOT DETECTED Final   Enterococcus Faecium NOT DETECTED NOT DETECTED Final   Listeria monocytogenes NOT DETECTED NOT DETECTED Final   Staphylococcus species  DETECTED (A) NOT DETECTED Final    Comment: CRITICAL RESULT CALLED TO, READ BACK BY AND VERIFIED WITH: PHARMD AUSTIN PAYTES ON 04/20/22 @ 1520 BY DRT    Staphylococcus aureus (BCID) NOT DETECTED NOT DETECTED Final   Staphylococcus epidermidis DETECTED (A) NOT DETECTED Final    Comment: CRITICAL RESULT CALLED TO, READ BACK BY AND VERIFIED WITH: PHARMD AUSTIN PAYTES ON 04/20/22 @ 1520 BY DRT    Staphylococcus lugdunensis NOT DETECTED NOT DETECTED Final   Streptococcus species NOT DETECTED NOT DETECTED Final   Streptococcus agalactiae NOT DETECTED NOT DETECTED Final   Streptococcus pneumoniae NOT DETECTED NOT DETECTED Final   Streptococcus pyogenes NOT DETECTED NOT DETECTED Final   A.calcoaceticus-baumannii NOT DETECTED NOT DETECTED Final   Bacteroides fragilis NOT DETECTED NOT DETECTED Final   Enterobacterales NOT DETECTED NOT DETECTED Final   Enterobacter cloacae complex NOT DETECTED NOT DETECTED Final   Escherichia coli NOT DETECTED NOT DETECTED Final   Klebsiella aerogenes NOT DETECTED NOT DETECTED Final   Klebsiella oxytoca NOT DETECTED NOT DETECTED Final   Klebsiella pneumoniae NOT DETECTED NOT DETECTED Final   Proteus species NOT DETECTED NOT DETECTED Final   Salmonella species NOT DETECTED NOT DETECTED Final   Serratia marcescens NOT DETECTED NOT DETECTED Final   Haemophilus influenzae NOT DETECTED NOT DETECTED Final   Neisseria meningitidis NOT DETECTED NOT DETECTED Final   Pseudomonas aeruginosa NOT DETECTED NOT DETECTED Final   Stenotrophomonas maltophilia NOT DETECTED NOT DETECTED Final   Candida albicans NOT DETECTED NOT DETECTED Final   Candida auris NOT DETECTED NOT DETECTED Final   Candida glabrata NOT DETECTED NOT DETECTED Final   Candida krusei NOT DETECTED NOT DETECTED Final   Candida parapsilosis NOT DETECTED NOT DETECTED Final   Candida tropicalis NOT DETECTED NOT DETECTED Final   Cryptococcus neoformans/gattii NOT DETECTED NOT DETECTED Final   Methicillin  resistance mecA/C NOT DETECTED NOT DETECTED Final    Comment: Performed at [facility], [address] [place], [place] [address]  Culture, blood (routine x 2)     Status: None (Preliminary result)   Collection Time: 04/19/22  9:04 PM   Specimen: BLOOD  Result Value Ref Range Status   Specimen Description BLOOD SITE NOT SPECIFIED  Final   Special Requests   Final    BOTTLES DRAWN AEROBIC AND ANAEROBIC Blood Culture adequate  volume   Culture   Final    NO GROWTH 3 DAYS Performed at [facility], [address], [place], [place] [address]    Report Status PENDING  Incomplete     Labs: Basic Metabolic Panel: Recent Labs  Lab 04/19/22 1846 04/20/22 0344 04/21/22 0420 04/22/22 0548  NA 130* 128* 134* 133*  K 4.2 4.1 3.7 3.5  CL 93* 94* 100 97*  CO2 12* 19* 24 24  GLUCOSE 81 91 105* 101*  BUN 27* 27* 14 8  CREATININE 5.22* 4.50* 2.35* 1.44*  CALCIUM 8.0* 8.1* 8.7* 8.4*  MG  --  1.4* 1.8 1.3*   Liver Function Tests: Recent Labs  Lab 04/19/22 1846 04/20/22 0344 04/21/22 0420 04/22/22 0548  AST 80* 73* 72* 85*  ALT 89* 86* 85* 95*  ALKPHOS 65 57 58 66  BILITOT 1.7* 1.5* 1.6* 1.1  PROT 6.3* 5.7* 6.1* 6.6  ALBUMIN 3.1* 2.9* 3.0* 3.1*   No results for input(s): "LIPASE", "AMYLASE" in the last 168 hours. No results for input(s): "AMMONIA" in the last 168 hours. CBC: Recent Labs  Lab 04/19/22 1846 04/21/22 0420 04/22/22 0548  WBC 4.9 3.4* 3.8*  NEUTROABS 3.9 2.2 2.1  HGB 12.3* 10.9* 12.2*  HCT 36.4* 31.7* 35.6*  MCV 102.8* 100.3* 100.0  PLT 118* 110* 117*   Cardiac Enzymes: No results for input(s): "CKTOTAL", "CKMB", "CKMBINDEX", "TROPONINI" in the last 168 hours. BNP: BNP (last 3 results) Recent Labs    04/19/22 1846  BNP 52.9    ProBNP (last 3 results) No results for input(s): "PROBNP" in the last 8760 hours.  CBG: No results for input(s): "GLUCAP" in the last 168 hours.     Signed:  Ramiro Harvest MD.  [facility] 04/22/2022, 11:01 AM

## 2022-04-22 NOTE — Plan of Care (Signed)

## 2022-04-22 NOTE — Progress Notes (Signed)
DISCHARGE NOTE HOME Scott Waller to be discharged Home per MD order. Discussed prescriptions and follow up appointments with the patient. Prescriptions given to patient; medication list explained in detail. Patient verbalized understanding.  Skin clean, dry and intact without evidence of skin break down, no evidence of skin tears noted. IV catheter discontinued intact. Site without signs and symptoms of complications. Dressing and pressure applied. Pt denies pain at the site currently. No complaints noted.  Patient free of lines, drains, and wounds.   An After Visit Summary (AVS) was printed and given to the patient. Patient escorted via wheelchair, and discharged home via private auto.  Myrtis Hopping, RN

## 2022-04-22 NOTE — Progress Notes (Signed)
Scott Waller Progress Note    Assessment/ Plan:    AKI - presumably ischemic ATN in setting of hypotension, volume depletion with concomitant ARB and HCTZ therapy.  Kidney function improving as BP improved w/ IVF. Hold off on further fluids. Peak Cr 5.2, down to 1.4 today. Would be okay with discharge to home from a nephrology perspective. Will arrange for outpatient labs and follow up. Time will tell as to where his kidney function will settle out to. Discussed w/ Mrs. Scott Waller as well. Renal dose meds for eGFR <15 ml/min Avoid nephrotoxic medications including NSAIDs and iodinated intravenous contrast exposure unless the latter is absolutely indicated. Preferred narcotic agents for pain control are hydromorphone, fentanyl, and methadone. Morphine should not be used.  Avoid Baclofen and avoid oral sodium phosphate and magnesium citrate based laxatives / bowel preps.  Continue strict Input and Output monitoring.  Will monitor the patient closely with you and intervene or adjust therapy as indicated by changes in clinical status/labs  Hypotension, h/o HTN - likely combination of volume depletion and recent increase in BP meds.  BP improved with fluid resuscitation. Now hypertensive, okay with lopressor 12.66m BID. Hyponatremia - likely pseudohyponatremia given normal serum osmolality.  Continue with IVF's and follow. Stable today, 1852today Alcoholic hepatitis - LFT's relatively stable Metabolic acidosis - resolved Hypomagnesemia - due to Etoh use.  On oral magnesium oxide oral replacement.  IV magnesium replacement is often ineffective unless give over long periods of time (ie 6-8 hours) due to tubular wasting, especially in Etoh abuse. Mag low today but receiving IV mag over 2 hours. On PO mag, can increase to 8073mBID if needed Alcohol withdrawals - under CIWA protocol.  He is interested in treatment program after discharge. Per primary service Lower extremity edema - likely due to  hypoalbuminemia related to liver injury. Suspecting he has chronic venous insufficiency as well based on exam. Advised compression stockings/leg elevation. Edema stable today. Off IVF. No need for diuretics at this time. EF 60-65% on echo 7/21 Tobacco abuse - nicotine patch per primary Thrombocytopenia - likely due to liver disease. Stable/no sequelae of bleeding  Subjective:   No acute events. Off ivf. Swelling in legs still persistent. To start metoprolol 12.66m42mID today. Eager to go home which is reasonable. No other complaints, denies CP/SOB.   Objective:   BP (!) 160/70   Pulse 84   Temp 98.4 F (36.9 C)   Resp 19   Ht _0  (1.88 m)   Wt (!) 164 kg   SpO2 97%   BMI 46.42 kg/m   Intake/Output Summary (Last 24 hours) at 04/22/2022 [id number] data filed at 04/22/2022 0602 Gross per 24 hour  Intake 2100 ml  Output --  Net 2100 ml   Weight change: -1.109 kg  Physical Exam: Gen:NAD, laying flat in bed CVS:S1S2, RRR Resp:CTA B/L, normal WOB, b/l chest expansion AbdUMP:NTIRWoft, nt/nd Ext:1+ pitting edema b/l LE's (with some chronic stasis changes) Neuro: nonfocal, awake/alert, moves all ext spontaneously, speech clear and coherent  Imaging: ECHOCARDIOGRAM COMPLETE  Result Date: 04/21/2022    ECHOCARDIOGRAM REPORT   Patient Name:   Scott Waller Date of Exam: 04/21/2022 Medical Rec #:  [id number]  Height:       74.0 in Accession #:    [id number] Weight:       364.0 lb Date of Birth:  [birth year]   BSA:  2.803 m Patient Age:    41 years      BP:           145/84 mmHg Patient Gender: M             HR:           78 bpm. Exam Location:  Inpatient Procedure: 2D Echo, Cardiac Doppler and Color Doppler Indications:    Bilateral leg edema  History:        Patient has no prior history of Echocardiogram examinations.                 CAD; Risk Factors:Hypertension.  Sonographer:    Jefferey Pica Referring Phys: [phone] ERIC CHEN  Sonographer Comments: Patient is morbidly obese.  Image acquisition challenging due to patient body habitus. IMPRESSIONS  1. Left ventricular ejection fraction, by estimation, is 60 to 65%. The left ventricle has normal function. The left ventricle has no regional wall motion abnormalities. Left ventricular diastolic parameters were normal.  2. Right ventricular systolic function is normal. The right ventricular size is normal.  3. The mitral valve is normal in structure. No evidence of mitral valve regurgitation. No evidence of mitral stenosis.  4. The aortic valve was not well visualized. There is mild calcification of the aortic valve. Aortic valve regurgitation is not visualized. Aortic valve sclerosis is present, with no evidence of aortic valve stenosis.  5. The inferior vena cava is normal in size with greater than 50% respiratory variability, suggesting right atrial pressure of 3 mmHg. FINDINGS  Left Ventricle: Left ventricular ejection fraction, by estimation, is 60 to 65%. The left ventricle has normal function. The left ventricle has no regional wall motion abnormalities. The left ventricular internal cavity size was normal in size. There is  no left ventricular hypertrophy. Left ventricular diastolic parameters were normal. Right Ventricle: The right ventricular size is normal. No increase in right ventricular wall thickness. Right ventricular systolic function is normal. Left Atrium: Left atrial size was normal in size. Right Atrium: Right atrial size was normal in size. Pericardium: Trivial pericardial effusion is present. The pericardial effusion is posterior to the left ventricle. Mitral Valve: The mitral valve is normal in structure. No evidence of mitral valve regurgitation. No evidence of mitral valve stenosis. Tricuspid Valve: The tricuspid valve is normal in structure. Tricuspid valve regurgitation is trivial. No evidence of tricuspid stenosis. Aortic Valve: The aortic valve was not well visualized. There is mild calcification of the aortic  valve. Aortic valve regurgitation is not visualized. Aortic valve sclerosis is present, with no evidence of aortic valve stenosis. Aortic valve peak gradient measures 11.0 mmHg. Pulmonic Valve: The pulmonic valve was not well visualized. Pulmonic valve regurgitation is not visualized. No evidence of pulmonic stenosis. Aorta: The aortic root is normal in size and structure. Venous: The inferior vena cava is normal in size with greater than 50% respiratory variability, suggesting right atrial pressure of 3 mmHg. IAS/Shunts: The interatrial septum was not well visualized.  LEFT VENTRICLE PLAX 2D LVIDd:         6.50 cm   Diastology LVIDs:         3.60 cm   LV e' medial:    8.17 cm/s LV PW:         1.40 cm   LV E/e' medial:  13.7 LV IVS:        1.10 cm   LV e' lateral:   14.50 cm/s LVOT diam:     2.10 cm  LV E/e' lateral: 7.7 LV SV:         81 LV SV Index:   29 LVOT Area:     3.46 cm  RIGHT VENTRICLE RV Basal diam:  2.90 cm RV S prime:     16.20 cm/s TAPSE (M-mode): 2.6 cm LEFT ATRIUM             Index LA diam:        4.50 cm 1.61 cm/m LA Vol (A2C):   64.9 ml 23.15 ml/m LA Vol (A4C):   77.4 ml 27.61 ml/m LA Biplane Vol: 72.5 ml 25.86 ml/m  AORTIC VALVE                 PULMONIC VALVE AV Area (Vmax): 2.80 cm     PV Vmax:       1.28 m/s AV Vmax:        166.00 cm/s  PV Peak grad:  6.6 mmHg AV Peak Grad:   11.0 mmHg LVOT Vmax:      134.00 cm/s LVOT Vmean:     78.700 cm/s LVOT VTI:       0.234 m  AORTA Ao Root diam: 3.60 cm Ao Asc diam:  3.60 cm MITRAL VALVE MV Area (PHT): 3.97 cm     SHUNTS MV Decel Time: 191 msec     Systemic VTI:  0.23 m MV E velocity: 112.00 cm/s  Systemic Diam: 2.10 cm MV A velocity: 99.60 cm/s MV E/A ratio:  1.12 Jenkins Rouge MD Electronically signed by Jenkins Rouge MD Signature Date/Time: 04/21/2022/11:30:13 AM    Final    US RENAL  Result Date: 04/20/2022 CLINICAL DATA:  [id number] acute renal failure EXAM: RENAL / URINARY TRACT ULTRASOUND COMPLETE COMPARISON:  None Available. FINDINGS: Right  Kidney: Renal measurements: 14.1 x 5.4 x 7.9 cm = volume: 314.5 mL. Echogenicity within normal limits. No mass or hydronephrosis visualized. Left Kidney: Renal measurements: 13.7 x 7 x 6.9 cm = volume: 300 mL. Echogenicity within normal limits. No mass or hydronephrosis visualized. Bladder: Appears normal for degree of bladder distention. Other: Limited visualization of the liver shows the echogenicity of the hepatic parenchyma is increased. IMPRESSION: Unremarkable ultrasound of the kidneys. No hydronephrosis or hydroureter seen. Electronically Signed   By: Frazier Richards M.D.   On: 04/20/2022 11:57    Labs: BMET Recent Labs  Lab 04/19/22 1846 04/20/22 0344 04/21/22 0420 04/22/22 0548  NA 130* 128* 134* 133*  K 4.2 4.1 3.7 3.5  CL 93* 94* 100 97*  CO2 12* 19* 24 24  GLUCOSE 81 91 105* 101*  BUN 27* 27* 14 8  CREATININE 5.22* 4.50* 2.35* 1.44*  CALCIUM 8.0* 8.1* 8.7* 8.4*   CBC Recent Labs  Lab 04/19/22 1846 04/21/22 0420 04/22/22 0548  WBC 4.9 3.4* 3.8*  NEUTROABS 3.9 2.2 2.1  HGB 12.3* 10.9* 12.2*  HCT 36.4* 31.7* 35.6*  MCV 102.8* 100.3* 100.0  PLT 118* 110* 117*    Medications:     aspirin  81 mg Oral Daily   chlordiazePOXIDE  25 mg Oral BH-qamhs   Followed by   Derrill Memo ON 04/23/2022] chlordiazePOXIDE  25 mg Oral Daily   folic acid  1 mg Oral Daily   heparin  5,000 Units Subcutaneous Q8H   lamoTRIgine  200 mg Oral BID   magnesium oxide  400 mg Oral BID   metoprolol tartrate  12.5 mg Oral BID   multivitamin with minerals  1 tablet Oral Daily   nicotine  21 mg Transdermal  Daily   rosuvastatin  10 mg Oral Daily   thiamine  100 mg Oral Daily   Or   thiamine  100 mg Intravenous Daily   thiamine  100 mg Intramuscular Once      Gean Quint, MD [facility] 04/22/2022, 9:52 AM

## 2022-04-24 LAB — CULTURE, BLOOD (ROUTINE X 2)
Culture: NO GROWTH
Special Requests: ADEQUATE

## 2022-05-12 ENCOUNTER — Ambulatory Visit: Payer: Self-pay | Admitting: Interventional Cardiology

## 2022-05-12 ENCOUNTER — Ambulatory Visit (INDEPENDENT_AMBULATORY_CARE_PROVIDER_SITE_OTHER): Payer: Managed Care, Other (non HMO) | Admitting: Interventional Cardiology

## 2022-05-12 ENCOUNTER — Encounter: Payer: Self-pay | Admitting: Interventional Cardiology

## 2022-05-12 VITALS — BP 124/70 | HR 68 | Ht 74.0 in | Wt 378.2 lb

## 2022-05-12 DIAGNOSIS — I10 Essential (primary) hypertension: Secondary | ICD-10-CM

## 2022-05-12 DIAGNOSIS — Z720 Tobacco use: Secondary | ICD-10-CM

## 2022-05-12 DIAGNOSIS — I25118 Atherosclerotic heart disease of native coronary artery with other forms of angina pectoris: Secondary | ICD-10-CM

## 2022-05-12 DIAGNOSIS — E782 Mixed hyperlipidemia: Secondary | ICD-10-CM

## 2022-05-12 MED ORDER — CLOPIDOGREL BISULFATE 75 MG PO TABS: 75.0000 mg | ORAL_TABLET | Freq: Every day | ORAL | 3 refills | Status: DC

## 2022-05-12 NOTE — Progress Notes (Signed)
Cardiology Office Note   Date:  05/12/2022   ID:  Scott Waller, DOB 1981-08-15, MRN [id number]  PCP:  Jettie Booze, NP    No chief complaint on file.  CAD  Wt Readings from Last 3 Encounters:  05/12/22 (!) 378 lb 3.2 oz (171.6 kg)  04/22/22 (!) 361 lb 8.9 oz (164 kg)  03/10/19 (!) 390 lb (176.9 kg)       History of Present Illness: Scott Waller is a 41 y.o. male  with a past medical history significant for hypertension, diabetes, obesity, family history of CAD with grandfather dying in his 87s of an MI another grandfather MI in his 56s, tobacco abuse with 20-pack-year history of smoking, EtOH drinks a sixpack daily, a lot of anxiety.  Had AF single episode in 2011 related to alcohol binge.  Negative stress Cardiolite in 2016.  He last saw Dr. Lamar Blinks, cardiologist with [facility] 03/2017.    Works at a [employer] in [place].    Prior records reveal: "He presented to the hospital on 10/13/2018 with complaints of several weeks of progressive worsening of chest pain.  Troponin became mildly elevated at 0.10.  He was taken for cardiac catheterization on 10/15/2018 which revealed 95% proximal LAD lesion that was successfully stented.  Plans for treatment with dual antiplatelet therapy with aspirin and ticagrelor for at least 12 months.  The patient will need aggressive lifestyle modification and adherence to medical regimen.  The patient did have some shortness of breath related to Brilinta in the hospital.  He was advised to try to complete 1 month of Brilinta and hopefully the side effect would resolve, if not could switch to Plavix.  The patient has type 2 diabetes with A1c less than 7.  He is on an ARB.  His diltiazem was switched to metoprolol in the setting of CAD.  Patient has hyperlipidemia with LDL of 133.  He was started on high intensity statin.  He will need lipids checked and 6 weeks post MI.  The patient has been smoking since age 80, used to smoke 3 packs/day now down to  1 pack/day."   He was doing well post PCI.  He was trying to be more active, stop smoking and lose weight. He had some atypical chest pains post PCI.   I have not seen him in several years.    In early July 2023, valsartan was changed to losartan.  He had been having some LE swelling.  He went back to the PMD.  He was not eating much.  He had low BP.  He was admitted for dehydration. He had ARF.    Hospital records show: "Felt secondary to ischemic ATN in the setting of hypotension, volume depletion in the setting of concomitant ARB HCTZ therapy.   -Patient was noted to be hypotensive on admission and hydrated aggressively with IV fluids. -Antihypertensive medications held during the hospitalization. -Renal ultrasound done unremarkable with no hydronephrosis or hydroureter seen. -Nephrology consulted on the patient during the hospitalization and followed the patient throughout the hospitalization. -Urine output improved during the hospitalization, renal function trended down such that by day of discharge creatinine was down to 1.44 from 5.22 on admission. -Patient's ARB HCTZ will be discontinued on discharge. -Outpatient follow-up with nephrology."  He has been treated with doxycycline and Lasix. Followed with Dr. Candiss Norse at nephrology.   Denies : Chest pain. Dizziness. Leg edema. Nitroglycerin use. Orthopnea. Palpitations. Paroxysmal nocturnal dyspnea. Shortness of breath. Syncope.  Past Medical History:  Diagnosis Date   AKI (acute kidney injury) (HCC)    Anxiety and depression    Arthritis    Asthma    Bronchitis    CAD (coronary artery disease)    a. 10/15/2018 DES to prox LAD   GERD (gastroesophageal reflux disease)    History of atrial fibrillation    During alcohol binge   Hyperlipidemia with target LDL less than 70    Hypertension    Tobacco abuse     Past Surgical History:  Procedure Laterality Date   APPENDECTOMY     CORONARY STENT INTERVENTION N/A 10/15/2018    Procedure: CORONARY STENT INTERVENTION;  Surgeon: Corky Crafts, MD;  Location: [facility] INVASIVE CV LAB;  Service: Cardiovascular;  Laterality: N/A;   INTRAVASCULAR ULTRASOUND/IVUS N/A 10/15/2018   Procedure: Intravascular Ultrasound/IVUS;  Surgeon: Corky Crafts, MD;  Location: [facility] INVASIVE CV LAB;  Service: Cardiovascular;  Laterality: N/A;   LEFT HEART CATH AND CORONARY ANGIOGRAPHY N/A 10/15/2018   Procedure: LEFT HEART CATH AND CORONARY ANGIOGRAPHY;  Surgeon: Corky Crafts, MD;  Location: [facility] INVASIVE CV LAB;  Service: Cardiovascular;  Laterality: N/A;     Current Outpatient Medications  Medication Sig Dispense Refill   albuterol (PROVENTIL) (2.5 MG/3ML) 0.083% nebulizer solution Take 3 mLs (2.5 mg total) by nebulization every 4 (four) hours as needed for wheezing or shortness of breath. 75 mL 12   ALPRAZolam (XANAX) 0.5 MG tablet Take 0.5 mg by mouth 3 (three) times daily as needed for anxiety.      aspirin 81 MG chewable tablet Chew 1 tablet (81 mg total) by mouth daily. 90 tablet 3   Clobetasol Propionate (TEMOVATE) 0.05 % external spray Apply 1 application topically 2 (two) times daily as needed.     COSENTYX SENSOREADY, 300 MG, 150 MG/ML SOAJ Inject 150 mg into the skin every 30 (thirty) days.     doxycycline (ADOXA) 100 MG tablet Take 100 mg by mouth 2 (two) times daily.     furosemide (LASIX) 40 MG tablet Take 40 mg by mouth in the morning and at bedtime.     lamoTRIgine (LAMICTAL) 200 MG tablet Take 200 mg by mouth 2 (two) times daily.     metoprolol tartrate (LOPRESSOR) 25 MG tablet Take 1 tablet (25 mg total) by mouth 2 (two) times daily. 180 tablet 3   Multiple Vitamins-Minerals (MULTIVITAMIN MEN PO) Take 1 tablet by mouth daily.     nicotine (NICODERM CQ - DOSED IN MG/24 HOURS) 21 mg/24hr patch Place 1 patch (21 mg total) onto the skin daily. 28 patch 0   nitroGLYCERIN (NITROSTAT) 0.4 MG SL tablet Place 0.4 mg under the tongue every 5 (five) minutes x 3 doses as  needed. If no improvement, call 911  [phone]   ondansetron (ZOFRAN-ODT) 8 MG disintegrating tablet Take 1 tablet (8 mg total) by mouth every 8 (eight) hours as needed for nausea or vomiting. 20 tablet 0   Oxycodone HCl 10 MG TABS Take 10 mg by mouth 4 (four) times daily as needed (pain).     pantoprazole (PROTONIX) 40 MG tablet Take 40 mg by mouth daily.     potassium chloride SA (KLOR-CON M) 20 MEQ tablet Take 20 mEq by mouth daily.     thiamine 100 MG tablet Take 1 tablet (100 mg total) by mouth daily.     traZODone (DESYREL) 150 MG tablet Take 150 mg by mouth at bedtime.     No current facility-administered medications for  this visit.    Allergies:   Desvenlafaxine    Social History:  The patient  reports that he has been smoking cigarettes. He has a 25.00 pack-year smoking history. He has never used smokeless tobacco. He reports current alcohol use of about 20.0 - 24.0 standard drinks of alcohol per week. He reports that he does not use drugs.   Family History:  The patient's family history includes Hypertension in his father.    ROS:  Please see the history of present illness.   Otherwise, review of systems are positive for leg edema.   All other systems are reviewed and negative.    PHYSICAL EXAM: VS:  BP 124/70   Pulse 68   Ht 6\' 2"  (1.88 m)   Wt (!) 378 lb 3.2 oz (171.6 kg)   BMI 48.56 kg/m  , BMI Body mass index is 48.56 kg/m. GEN: Well nourished, well developed, in no acute distress HEENT: normal Neck: no JVD, carotid bruits, or masses Cardiac: RRR; no murmurs, rubs, or gallops,no edema  Respiratory:  clear to auscultation bilaterally, normal work of breathing GI: soft, nontender, nondistended, + BS MS: no deformity or atrophy Skin: warm and dry, no rash Neuro:  Strength and sensation are intact Psych: euthymic mood, full affect   EKG:   The ekg ordered 7/19 demonstrates sinus rhythm   Recent Labs: 04/19/2022: B Natriuretic Peptide 52.9 04/20/2022: TSH  3.197 04/22/2022: ALT 95; BUN 8; Creatinine, Ser 1.44; Hemoglobin 12.2; Magnesium 1.3; Platelets 117; Potassium 3.5; Sodium 133   Lipid Panel    Component Value Date/Time   CHOL 211 (H) 10/16/2018 0331   TRIG 149 10/16/2018 0331   HDL 48 10/16/2018 0331   CHOLHDL 4.4 10/16/2018 0331   VLDL 30 10/16/2018 0331   LDLCALC 133 (H) 10/16/2018 0331     Other studies Reviewed: Additional studies/ records that were reviewed today with results demonstrating: Labs reviewed: July 2023, total cholesterol 308, LDL 152.   ASSESSMENT AND PLAN:  CAD: Prior LAD stent.  No angina.  He needs aggressive secondary prevention.   Stop aspirin.  Start Plavix 75 mg daily. HTN: The current medical regimen is effective;  continue present plan and medications.  Had issues with low blood pressure.  This was the likely cause of renal failure.  Blood pressure now stabilized. Hyperlipidemia: Statin intolerant.  Will refer to Pharm.D. lipid clinic for consideration of PCSK9 inhibitor. Tobacco abuse: He needs to stop smoking. Morbid obesity: He has lost some weight.  Healthy diet.  Try to increase exercise as his legs allow. LE edema: venous insufficiency.  Elevate legs is much as possible.  Compression stockings recommended as well, 15 to 20 mmHg.  Redness in his legs may be stasis dermatitis.  If no improvement with conservative measures and additional diuretic, could consider referral to [facility].   Current medicines are reviewed at length with the patient today.  The patient concerns regarding his medicines were addressed.  The following changes have been made:  No change  Labs/ tests ordered today include:  No orders of the defined types were placed in this encounter.   Recommend 150 minutes/week of aerobic exercise Low fat, low carb, high fiber diet recommended  Disposition:   FU in 1 year   Signed, 03-30-1994, MD  05/12/2022 2:36 PM    [facility] [address] [place],  [place], [place]  [place] Phone: [phone]; Fax: [phone]

## 2022-05-12 NOTE — Patient Instructions (Signed)
Medication Instructions:  Your physician has recommended you make the following change in your medication: Stop aspirin Start Clopidogrel 75 mg by mouth daily  *If you need a refill on your cardiac medications before your next appointment, please call your pharmacy*   Lab Work: none If you have labs (blood work) drawn today and your tests are completely normal, you will receive your results only by: MyChart Message (if you have MyChart) OR A paper copy in the mail If you have any lab test that is abnormal or we need to change your treatment, we will call you to review the results.   Testing/Procedures: none   Follow-Up: At [facility], you and your health needs are our priority.  As part of our continuing mission to provide you with exceptional heart care, we have created designated Provider Care Teams.  These Care Teams include your primary Cardiologist (physician) and Advanced Practice Providers (APPs -  Physician Assistants and Nurse Practitioners) who all work together to provide you with the care you need, when you need it.  We recommend signing up for the patient portal called "MyChart".  Sign up information is provided on this After Visit Summary.  MyChart is used to connect with patients for Virtual Visits (Telemedicine).  Patients are able to view lab/test results, encounter notes, upcoming appointments, etc.  Non-urgent messages can be sent to your provider as well.   To learn more about what you can do with MyChart, go to [email or web address].    Your next appointment:   12 month(s)  The format for your next appointment:   In Person  Provider:   Lance Muss, MD     Other Instructions You have been referred to the lipid clinic in our office.  Please schedule new patient appointment.  Elevate legs and wear compression stockings     Important Information About Sugar

## 2022-05-31 ENCOUNTER — Ambulatory Visit: Payer: Self-pay | Admitting: Interventional Cardiology

## 2022-06-05 ENCOUNTER — Emergency Department (HOSPITAL_COMMUNITY): Payer: Managed Care, Other (non HMO)

## 2022-06-05 ENCOUNTER — Encounter (HOSPITAL_COMMUNITY): Payer: Self-pay | Admitting: Emergency Medicine

## 2022-06-05 ENCOUNTER — Emergency Department (HOSPITAL_COMMUNITY)
Admission: EM | Admit: 2022-06-05 | Discharge: 2022-06-05 | Disposition: A | Discharge status: Home / Self Care | Payer: Managed Care, Other (non HMO) | Source: Home / Self Care | Attending: Emergency Medicine | Admitting: Emergency Medicine

## 2022-06-05 ENCOUNTER — Other Ambulatory Visit: Payer: Self-pay

## 2022-06-05 DIAGNOSIS — F1090 Alcohol use, unspecified, uncomplicated: Secondary | ICD-10-CM | POA: Insufficient documentation

## 2022-06-05 DIAGNOSIS — G894 Chronic pain syndrome: Secondary | ICD-10-CM

## 2022-06-05 DIAGNOSIS — M79604 Pain in right leg: Secondary | ICD-10-CM | POA: Insufficient documentation

## 2022-06-05 DIAGNOSIS — I10 Essential (primary) hypertension: Secondary | ICD-10-CM | POA: Insufficient documentation

## 2022-06-05 DIAGNOSIS — Z79899 Other long term (current) drug therapy: Secondary | ICD-10-CM | POA: Insufficient documentation

## 2022-06-05 DIAGNOSIS — I2510 Atherosclerotic heart disease of native coronary artery without angina pectoris: Secondary | ICD-10-CM | POA: Insufficient documentation

## 2022-06-05 DIAGNOSIS — Z23 Encounter for immunization: Secondary | ICD-10-CM | POA: Insufficient documentation

## 2022-06-05 DIAGNOSIS — D696 Thrombocytopenia, unspecified: Secondary | ICD-10-CM | POA: Insufficient documentation

## 2022-06-05 DIAGNOSIS — F29 Unspecified psychosis not due to a substance or known physiological condition: Secondary | ICD-10-CM | POA: Diagnosis not present

## 2022-06-05 DIAGNOSIS — M79605 Pain in left leg: Secondary | ICD-10-CM | POA: Insufficient documentation

## 2022-06-05 DIAGNOSIS — I480 Paroxysmal atrial fibrillation: Secondary | ICD-10-CM | POA: Diagnosis not present

## 2022-06-05 DIAGNOSIS — Y908 Blood alcohol level of 240 mg/100 ml or more: Secondary | ICD-10-CM | POA: Insufficient documentation

## 2022-06-05 DIAGNOSIS — Z7902 Long term (current) use of antithrombotics/antiplatelets: Secondary | ICD-10-CM | POA: Insufficient documentation

## 2022-06-05 DIAGNOSIS — F10139 Alcohol abuse with withdrawal, unspecified: Secondary | ICD-10-CM | POA: Diagnosis not present

## 2022-06-05 DIAGNOSIS — R002 Palpitations: Secondary | ICD-10-CM | POA: Diagnosis not present

## 2022-06-05 LAB — CBC WITH DIFFERENTIAL/PLATELET
Abs Immature Granulocytes: 0.01 10*3/uL (ref 0.00–0.07)
Basophils Absolute: 0 10*3/uL (ref 0.0–0.1)
Basophils Relative: 1 %
Eosinophils Absolute: 0 10*3/uL (ref 0.0–0.5)
Eosinophils Relative: 0 %
HCT: 40.4 % (ref 39.0–52.0)
Hemoglobin: 13.6 g/dL (ref 13.0–17.0)
Immature Granulocytes: 0 %
Lymphocytes Relative: 16 %
Lymphs Abs: 0.7 10*3/uL (ref 0.7–4.0)
MCH: 34.3 pg — ABNORMAL HIGH (ref 26.0–34.0)
MCHC: 33.7 g/dL (ref 30.0–36.0)
MCV: 102 fL — ABNORMAL HIGH (ref 80.0–100.0)
Monocytes Absolute: 0.3 10*3/uL (ref 0.1–1.0)
Monocytes Relative: 7 %
Neutro Abs: 3.4 10*3/uL (ref 1.7–7.7)
Neutrophils Relative %: 76 %
Platelets: 125 10*3/uL — ABNORMAL LOW (ref 150–400)
RBC: 3.96 MIL/uL — ABNORMAL LOW (ref 4.22–5.81)
RDW: 16 % — ABNORMAL HIGH (ref 11.5–15.5)
WBC: 4.5 10*3/uL (ref 4.0–10.5)
nRBC: 0 % (ref 0.0–0.2)

## 2022-06-05 LAB — COMPREHENSIVE METABOLIC PANEL
ALT: 51 U/L — ABNORMAL HIGH (ref 0–44)
AST: 99 U/L — ABNORMAL HIGH (ref 15–41)
Albumin: 3.5 g/dL (ref 3.5–5.0)
Alkaline Phosphatase: 86 U/L (ref 38–126)
Anion gap: 17 — ABNORMAL HIGH (ref 5–15)
BUN: 10 mg/dL (ref 6–20)
CO2: 26 mmol/L (ref 22–32)
Calcium: 9.2 mg/dL (ref 8.9–10.3)
Chloride: 93 mmol/L — ABNORMAL LOW (ref 98–111)
Creatinine, Ser: 0.97 mg/dL (ref 0.61–1.24)
GFR, Estimated: >60 mL/min (ref >60)
Glucose, Bld: 80 mg/dL (ref 70–99)
Potassium: 3.6 mmol/L (ref 3.5–5.1)
Sodium: 136 mmol/L (ref 135–145)
Total Bilirubin: 2.5 mg/dL — ABNORMAL HIGH (ref 0.3–1.2)
Total Protein: 7.4 g/dL (ref 6.5–8.1)

## 2022-06-05 LAB — ETHANOL: Alcohol, Ethyl (B): 248 mg/dL — ABNORMAL HIGH (ref <10)

## 2022-06-05 LAB — RAPID URINE DRUG SCREEN, HOSP PERFORMED
Amphetamines: NOT DETECTED
Barbiturates: NOT DETECTED
Benzodiazepines: POSITIVE — AB
Cocaine: NOT DETECTED
Opiates: POSITIVE — AB
Tetrahydrocannabinol: NOT DETECTED

## 2022-06-05 MED ORDER — HYDROXYZINE HCL 25 MG PO TABS
25.0000 mg | ORAL_TABLET | Freq: Once | ORAL | Status: AC
  Administered 2022-06-05: 25 mg via ORAL
  Filled 2022-06-05: qty 1

## 2022-06-05 MED ORDER — SODIUM CHLORIDE 0.9 % IV BOLUS
1000.0000 mL | Freq: Once | INTRAVENOUS | Status: AC
  Administered 2022-06-05: 1000 mL via INTRAVENOUS

## 2022-06-05 MED ORDER — OXYCODONE-ACETAMINOPHEN 5-325 MG PO TABS
2.0000 | ORAL_TABLET | Freq: Once | ORAL | Status: AC
  Administered 2022-06-05: 2 via ORAL
  Filled 2022-06-05: qty 2

## 2022-06-05 MED ORDER — LORAZEPAM 2 MG/ML IJ SOLN
1.0000 mg | Freq: Once | INTRAMUSCULAR | Status: AC
  Administered 2022-06-05: 1 mg via INTRAVENOUS
  Filled 2022-06-05: qty 1

## 2022-06-05 MED ORDER — TETANUS-DIPHTH-ACELL PERTUSSIS 5-2.5-18.5 LF-MCG/0.5 IM SUSY
0.5000 mL | PREFILLED_SYRINGE | Freq: Once | INTRAMUSCULAR | Status: AC
  Administered 2022-06-05: 0.5 mL via INTRAMUSCULAR
  Filled 2022-06-05: qty 0.5

## 2022-06-05 NOTE — ED Notes (Signed)
Pt alert and oriented x4 . Wheeled from ED. Dressed for discharge. Has access to home. Pt and significant other verbalized understanding to discharge instructions.

## 2022-06-05 NOTE — ED Triage Notes (Signed)
BIBA Per EMS: Pt was on oxy for year for arthritis, ran out and doesn't want to take anymore. Pt also reports taking a few shots today.  140/80 84HR 82cbg

## 2022-06-05 NOTE — ED Notes (Signed)
Pt reports 10/10 pain and increasing anxiety. When pt was offered Atrax for his anxiety pt stated I want ativan. Nurse educated patient on Atrax and pt agreed to take it.

## 2022-06-05 NOTE — ED Provider Notes (Addendum)
Scott Waller Provider Note   CSN: [id number] Arrival date & time: 06/05/22  1602     History  Chief Complaint  Patient presents with   Withdrawal    Scott Waller is a 41 y.o. male.  Patient is a 41 year old male who presents with possible withdrawal symptoms.  He has a history of alcohol abuse, hypertension, coronary artery disease, obesity, psoriatic arthritis.  He states he is on chronic oxycodone for chronic pain.  [place] pain medicine.  He says he has not taken in 3 days.  He feels like he is withdrawing because he hurts all over.  He does say he has been drinking alcohol and says he had 3 shots today.  He denies any nausea vomiting or diarrhea.  No chest pain or shortness of breath.  He is having a lot of anxiety and is asking about his wife coming to sit with him.  [place] and slipped on [place] ladder.  He has some injuries to his lower legs resulting from that.  He also had some wounds on his left arm.  He is not sure when his last tetanus shot was.  These happened several days ago.  He denies hitting his head.  No other injuries from the fall.       Home Medications Prior to Admission medications   Medication Sig Start Date End Date Taking? Authorizing Provider  albuterol (PROVENTIL) (2.5 MG/3ML) 0.083% nebulizer solution Take 3 mLs (2.5 mg total) by nebulization every 4 (four) hours as needed for wheezing or shortness of breath. 09/28/17   [name], MD  ALPRAZolam Prudy Feeler) 0.5 MG tablet Take 0.5 mg by mouth 3 (three) times daily as needed for anxiety.  11/07/16   [provider]  Clobetasol Propionate (TEMOVATE) 0.05 % external spray Apply 1 application topically 2 (two) times daily as needed. 08/12/18   [provider]  clopidogrel (PLAVIX) 75 MG tablet Take 1 tablet (75 mg total) by mouth daily. 05/12/22    Corky Crafts, MD  COSENTYX SENSOREADY, 300 MG, 150 MG/ML SOAJ Inject 150 mg into the skin every 30 (thirty) days. 07/29/18   [provider]  doxycycline (ADOXA) 100 MG tablet Take 100 mg by mouth 2 (two) times daily. 05/09/22   [provider]  furosemide (LASIX) 40 MG tablet Take 40 mg by mouth in the morning and at bedtime. 05/06/22   [provider]  lamoTRIgine (LAMICTAL) 200 MG tablet Take 200 mg by mouth 2 (two) times daily. 04/03/22   [provider]  metoprolol tartrate (LOPRESSOR) 25 MG tablet Take 1 tablet (25 mg total) by mouth 2 (two) times daily. 10/16/18   Berton Bon, NP  Multiple Vitamins-Minerals (MULTIVITAMIN MEN PO) Take 1 tablet by mouth daily.    [provider]  nicotine (NICODERM CQ - DOSED IN MG/24 HOURS) 21 mg/24hr patch Place 1 patch (21 mg total) onto the skin daily. 10/16/18   Berton Bon, NP  nitroGLYCERIN (NITROSTAT) 0.4 MG SL tablet Place 0.4 mg under the tongue every 5 (five) minutes x 3 doses as needed. If no improvement, call 911 08/08/18   [provider]  ondansetron (ZOFRAN-ODT) 8 MG disintegrating tablet Take 1 tablet (8 mg total) by mouth every 8 (eight) hours as needed for nausea or vomiting. 08/14/18   Harolyn Rutherford  C, PA-C  Oxycodone HCl 10 MG TABS Take 10 mg by mouth 4 (four) times daily as needed (pain). 04/17/22   [provider]  pantoprazole (PROTONIX) 40 MG tablet Take 40 mg by mouth daily.    [provider]  potassium chloride SA (KLOR-CON M) 20 MEQ tablet Take 20 mEq by mouth daily. 05/04/22   [provider]  thiamine 100 MG tablet Take 1 tablet (100 mg total) by mouth daily. 04/23/22   Rodolph Bong, MD  traZODone (DESYREL) 150 MG tablet Take 150 mg by mouth at bedtime.    [provider]      Allergies    Desvenlafaxine    Review of Systems   Review of Systems  Constitutional:  Negative for chills, diaphoresis, fatigue and fever.  HENT:  Negative  for congestion, rhinorrhea and sneezing.   Eyes: Negative.   Respiratory:  Negative for cough, chest tightness and shortness of breath.   Cardiovascular:  Negative for chest pain and leg swelling.  Gastrointestinal:  Negative for abdominal pain, blood in stool, diarrhea, nausea and vomiting.  Genitourinary:  Negative for difficulty urinating, flank pain, frequency and hematuria.  Musculoskeletal:  Negative for arthralgias and back pain.       Pain all over  Skin:  Positive for wound. Negative for rash.  Neurological:  Negative for dizziness, speech difficulty, weakness, numbness and headaches.  Psychiatric/Behavioral:  The patient is nervous/anxious.     Physical Exam Updated Vital Signs BP (!) 114/41   Pulse 70   Temp 98.1 F (36.7 C) (Oral)   Resp 17   SpO2 93%  Physical Exam Constitutional:      Appearance: He is well-developed. He is obese.  HENT:     Head: Normocephalic and atraumatic.  Eyes:     Pupils: Pupils are equal, round, and reactive to light.  Cardiovascular:     Rate and Rhythm: Normal rate and regular rhythm.     Heart sounds: Normal heart sounds.  Pulmonary:     Effort: Pulmonary effort is normal. No respiratory distress.     Breath sounds: Normal breath sounds. No wheezing or rales.  Chest:     Chest wall: No tenderness.  Abdominal:     General: Bowel sounds are normal.     Palpations: Abdomen is soft.     Tenderness: There is no abdominal tenderness. There is no guarding or rebound.  Musculoskeletal:        General: Normal range of motion.     Cervical back: Normal range of motion and neck supple.     Comments: Several skin tears and blood-filled vesicles are noted to the anterior bilateral lower legs.  There are some generalized tenderness to the legs.  Pedal pulses are intact.  He has normal movement to his lower extremities bilaterally.  No deformity is noted.  He has some trace edema to lower extremities bilaterally.  No suggestions of cellulitis.   Lymphadenopathy:     Cervical: No cervical adenopathy.  Skin:    General: Skin is warm and dry.     Findings: No rash.  Neurological:     Mental Status: He is alert and oriented to person, place, and time.     ED Results / Procedures / Treatments   Labs (all labs ordered are listed, but only abnormal results are displayed) Labs Reviewed  COMPREHENSIVE METABOLIC PANEL - Abnormal; Notable for the following components:      Result Value   Chloride 93 (*)  AST 99 (*)    ALT 51 (*)    Total Bilirubin 2.5 (*)    Anion gap 17 (*)    All other components within normal limits  CBC WITH DIFFERENTIAL/PLATELET - Abnormal; Notable for the following components:   RBC 3.96 (*)    MCV 102.0 (*)    MCH 34.3 (*)    RDW 16.0 (*)    Platelets 125 (*)    All other components within normal limits  ETHANOL - Abnormal; Notable for the following components:   Alcohol, Ethyl (B) 248 (*)    All other components within normal limits  RAPID URINE DRUG SCREEN, HOSP PERFORMED - Abnormal; Notable for the following components:   Opiates POSITIVE (*)    Benzodiazepines POSITIVE (*)    All other components within normal limits    EKG None  Radiology DG Tibia/Fibula Right  Result Date: 06/05/2022 CLINICAL DATA:  injury to both legs EXAM: RIGHT TIBIA AND FIBULA - 2 VIEW COMPARISON:  None Available. FINDINGS: Well-corticated osseous fragments along the inferior aspect of the patellofemoral joint on lateral view may represent an old fracture versus heterotopic calcification. Patellar alignment on frontal view appears laterally subluxed which may be due to patient positioning. There is no evidence of fracture or other focal bone lesions. Soft tissues are unremarkable. IMPRESSION: 1. No acute displaced fracture or dislocation. 2. Patellar alignment on frontal view appears laterally subluxed which may be due to patient positioning. Recommend sunrise view for further evaluation. Electronically Signed   By:  Tish Frederickson M.D.   On: 06/05/2022 17:24   DG Tibia/Fibula Left  Result Date: 06/05/2022 CLINICAL DATA:  injury to both legs EXAM: LEFT TIBIA AND FIBULA - 2 VIEW COMPARISON:  None Available. FINDINGS: There is no evidence of fracture or other focal bone lesions. Soft tissues are unremarkable. IMPRESSION: Negative. Electronically Signed   By: Tish Frederickson M.D.   On: 06/05/2022 17:22    Procedures Procedures    Medications Ordered in ED Medications  oxyCODONE-acetaminophen (PERCOCET/ROXICET) 5-325 MG per tablet 2 tablet (has no administration in time range)  Tdap (BOOSTRIX) injection 0.5 mL (0.5 mLs Intramuscular Given 06/05/22 1650)  LORazepam (ATIVAN) injection 1 mg (1 mg Intravenous Given 06/05/22 1715)  sodium chloride 0.9 % bolus 1,000 mL (1,000 mLs Intravenous New Bag/Given 06/05/22 1738)  hydrOXYzine (ATARAX) tablet 25 mg (25 mg Oral Given 06/05/22 1931)    ED Course/ Medical Decision Making/ A&P                           Medical Decision Making Amount and/or Complexity of Data Reviewed Labs: ordered. Radiology: ordered.  Risk Prescription drug management.   Patient is a 41 year old male who presents with alcohol intoxication.  He also has a desire to get off his chronic pain medications.  He has been on 10 mg oxycodone for a long time and has interest in getting off of it.  His labs show an elevated EtOH level.  His LFTs are elevated but on chart review, appear to be more of a chronic finding.  He has some chronic thrombocytopenia.  He was given IV fluids and allowed to sober up a little bit in the ED.  I reassessed him.  He is desiring being admitted to a substance abuse treatment program for his alcohol abuse and to get off of his chronic pain pills.  I did advise him that were unable to place him from the ED but  would give him outpatient resources.  I did advise him that he can walk-in to the behavioral health urgent care for further assessment in this.  His wife is at bedside as  well.  He is currently out of his pain medication.  He request that we give him 1 dose in the ED which we did.  He will contact his neurologist tomorrow regarding whether to continue these or whether he can be switched to something else given that he has desire to get off of these medications.  He currently denies any suicidal ideations.  He was discharged home in good condition.  He was given resources for outpatient follow-up.  Regarding his lower extremity wounds, they were redressed.  His tetanus shot was updated.  X-rays were obtained of his legs which showed no underlying bony injuries.  He is were interpreted by me and confirmed by the radiologist.  Final Clinical Impression(s) / ED Diagnoses Final diagnoses:  Chronic pain syndrome  Alcohol use disorder    Rx / DC Orders ED Discharge Orders     None         Rolan Bucco, MD 06/05/22 2003    Rolan Bucco, MD 06/05/22 2004

## 2022-06-06 ENCOUNTER — Encounter (HOSPITAL_COMMUNITY): Payer: Self-pay | Admitting: Emergency Medicine

## 2022-06-06 ENCOUNTER — Ambulatory Visit (INDEPENDENT_AMBULATORY_CARE_PROVIDER_SITE_OTHER)
Admission: EM | Admit: 2022-06-06 | Discharge: 2022-06-07 | Disposition: A | Discharge status: Other Acute Inpatient Hospital | Payer: Managed Care, Other (non HMO) | Attending: Family | Admitting: Family

## 2022-06-06 DIAGNOSIS — R45851 Suicidal ideations: Secondary | ICD-10-CM | POA: Diagnosis not present

## 2022-06-06 DIAGNOSIS — F1010 Alcohol abuse, uncomplicated: Secondary | ICD-10-CM | POA: Insufficient documentation

## 2022-06-06 DIAGNOSIS — Z20822 Contact with and (suspected) exposure to covid-19: Secondary | ICD-10-CM | POA: Insufficient documentation

## 2022-06-06 DIAGNOSIS — F1090 Alcohol use, unspecified, uncomplicated: Secondary | ICD-10-CM

## 2022-06-06 DIAGNOSIS — F32A Depression, unspecified: Secondary | ICD-10-CM | POA: Insufficient documentation

## 2022-06-06 DIAGNOSIS — Z79899 Other long term (current) drug therapy: Secondary | ICD-10-CM | POA: Insufficient documentation

## 2022-06-06 DIAGNOSIS — F17210 Nicotine dependence, cigarettes, uncomplicated: Secondary | ICD-10-CM | POA: Insufficient documentation

## 2022-06-06 LAB — COMPREHENSIVE METABOLIC PANEL
ALT: 54 U/L — ABNORMAL HIGH (ref 0–44)
AST: 106 U/L — ABNORMAL HIGH (ref 15–41)
Albumin: 3.5 g/dL (ref 3.5–5.0)
Alkaline Phosphatase: 89 U/L (ref 38–126)
Anion gap: 19 — ABNORMAL HIGH (ref 5–15)
BUN: 5 mg/dL — ABNORMAL LOW (ref 6–20)
CO2: 25 mmol/L (ref 22–32)
Calcium: 9.3 mg/dL (ref 8.9–10.3)
Chloride: 91 mmol/L — ABNORMAL LOW (ref 98–111)
Creatinine, Ser: 0.96 mg/dL (ref 0.61–1.24)
GFR, Estimated: >60 mL/min (ref >60)
Glucose, Bld: 74 mg/dL (ref 70–99)
Potassium: 3.4 mmol/L — ABNORMAL LOW (ref 3.5–5.1)
Sodium: 135 mmol/L (ref 135–145)
Total Bilirubin: 2.6 mg/dL — ABNORMAL HIGH (ref 0.3–1.2)
Total Protein: 7.5 g/dL (ref 6.5–8.1)

## 2022-06-06 LAB — CBC WITH DIFFERENTIAL/PLATELET
Abs Immature Granulocytes: 0.02 10*3/uL (ref 0.00–0.07)
Basophils Absolute: 0.1 10*3/uL (ref 0.0–0.1)
Basophils Relative: 1 %
Eosinophils Absolute: 0 10*3/uL (ref 0.0–0.5)
Eosinophils Relative: 0 %
HCT: 40.3 % (ref 39.0–52.0)
Hemoglobin: 14.1 g/dL (ref 13.0–17.0)
Immature Granulocytes: 0 %
Lymphocytes Relative: 19 %
Lymphs Abs: 0.9 10*3/uL (ref 0.7–4.0)
MCH: 34.8 pg — ABNORMAL HIGH (ref 26.0–34.0)
MCHC: 35 g/dL (ref 30.0–36.0)
MCV: 99.5 fL (ref 80.0–100.0)
Monocytes Absolute: 0.4 10*3/uL (ref 0.1–1.0)
Monocytes Relative: 8 %
Neutro Abs: 3.5 10*3/uL (ref 1.7–7.7)
Neutrophils Relative %: 72 %
Platelets: 123 10*3/uL — ABNORMAL LOW (ref 150–400)
RBC: 4.05 MIL/uL — ABNORMAL LOW (ref 4.22–5.81)
RDW: 15.9 % — ABNORMAL HIGH (ref 11.5–15.5)
WBC: 4.9 10*3/uL (ref 4.0–10.5)
nRBC: 0 % (ref 0.0–0.2)

## 2022-06-06 LAB — LIPID PANEL
Cholesterol: 290 mg/dL — ABNORMAL HIGH (ref 0–200)
HDL: 98 mg/dL (ref >40)
LDL Cholesterol: 177 mg/dL — ABNORMAL HIGH (ref 0–99)
Total CHOL/HDL Ratio: 3 RATIO
Triglycerides: 75 mg/dL (ref <150)
VLDL: 15 mg/dL (ref 0–40)

## 2022-06-06 LAB — RESP PANEL BY RT-PCR (FLU A&B, COVID) ARPGX2
Influenza A by PCR: NEGATIVE
Influenza B by PCR: NEGATIVE
SARS Coronavirus 2 by RT PCR: NEGATIVE

## 2022-06-06 LAB — HEMOGLOBIN A1C
Hgb A1c MFr Bld: 5.4 % (ref 4.8–5.6)
Mean Plasma Glucose: 108.28 mg/dL

## 2022-06-06 LAB — MAGNESIUM: Magnesium: 2 mg/dL (ref 1.7–2.4)

## 2022-06-06 LAB — POCT URINE DRUG SCREEN - MANUAL ENTRY (I-SCREEN)
POC Amphetamine UR: NOT DETECTED
POC Buprenorphine (BUP): NOT DETECTED
POC Cocaine UR: NOT DETECTED
POC Marijuana UR: POSITIVE — AB
POC Methadone UR: NOT DETECTED
POC Methamphetamine UR: NOT DETECTED
POC Morphine: POSITIVE — AB
POC Oxazepam (BZO): POSITIVE — AB
POC Oxycodone UR: POSITIVE — AB
POC Secobarbital (BAR): NOT DETECTED

## 2022-06-06 LAB — POC SARS CORONAVIRUS 2 AG: SARSCOV2ONAVIRUS 2 AG: NEGATIVE

## 2022-06-06 LAB — ETHANOL: Alcohol, Ethyl (B): 178 mg/dL — ABNORMAL HIGH (ref <10)

## 2022-06-06 LAB — TSH: TSH: 3.787 u[IU]/mL (ref 0.350–4.500)

## 2022-06-06 MED ORDER — THIAMINE MONONITRATE 100 MG PO TABS: 100.0000 mg | ORAL_TABLET | Freq: Every day | ORAL | Status: DC

## 2022-06-06 MED ORDER — LORAZEPAM 1 MG PO TABS
1.0000 mg | ORAL_TABLET | Freq: Four times a day (QID) | ORAL | Status: DC | PRN
  Administered 2022-06-07: 1 mg via ORAL
  Filled 2022-06-06: qty 1

## 2022-06-06 MED ORDER — THIAMINE HCL 100 MG/ML IJ SOLN
100.0000 mg | Freq: Once | INTRAMUSCULAR | Status: AC
  Administered 2022-06-06: 100 mg via INTRAMUSCULAR
  Filled 2022-06-06: qty 2

## 2022-06-06 MED ORDER — LORAZEPAM 1 MG PO TABS: 1.0000 mg | ORAL_TABLET | Freq: Every day | ORAL | Status: DC

## 2022-06-06 MED ORDER — TRAZODONE HCL 50 MG PO TABS: 50.0000 mg | ORAL_TABLET | Freq: Every evening | ORAL | Status: DC | PRN

## 2022-06-06 MED ORDER — ALUM & MAG HYDROXIDE-SIMETH 200-200-20 MG/5ML PO SUSP: 30.0000 mL | ORAL | Status: DC | PRN

## 2022-06-06 MED ORDER — LAMOTRIGINE 100 MG PO TABS
200.0000 mg | ORAL_TABLET | Freq: Two times a day (BID) | ORAL | Status: DC
  Administered 2022-06-06: 200 mg via ORAL
  Filled 2022-06-06: qty 2

## 2022-06-06 MED ORDER — LORAZEPAM 1 MG PO TABS
1.0000 mg | ORAL_TABLET | Freq: Four times a day (QID) | ORAL | Status: DC
  Administered 2022-06-06 (×2): 1 mg via ORAL
  Filled 2022-06-06 (×2): qty 1

## 2022-06-06 MED ORDER — POTASSIUM CHLORIDE CRYS ER 20 MEQ PO TBCR: 20.0000 meq | EXTENDED_RELEASE_TABLET | Freq: Every day | ORAL | Status: DC

## 2022-06-06 MED ORDER — MAGNESIUM HYDROXIDE 400 MG/5ML PO SUSP: 30.0000 mL | Freq: Every day | ORAL | Status: DC | PRN

## 2022-06-06 MED ORDER — DICYCLOMINE HCL 20 MG PO TABS: 20.0000 mg | ORAL_TABLET | Freq: Four times a day (QID) | ORAL | Status: DC | PRN

## 2022-06-06 MED ORDER — METOPROLOL SUCCINATE ER 50 MG PO TB24: 50.0000 mg | ORAL_TABLET | Freq: Every day | ORAL | Status: DC

## 2022-06-06 MED ORDER — ADULT MULTIVITAMIN W/MINERALS CH
1.0000 | ORAL_TABLET | Freq: Every day | ORAL | Status: DC
  Administered 2022-06-06: 1 via ORAL
  Filled 2022-06-06: qty 1

## 2022-06-06 MED ORDER — HYDROXYZINE HCL 25 MG PO TABS
25.0000 mg | ORAL_TABLET | Freq: Three times a day (TID) | ORAL | Status: DC | PRN
  Administered 2022-06-06: 25 mg via ORAL
  Filled 2022-06-06: qty 1

## 2022-06-06 MED ORDER — NAPROXEN 500 MG PO TABS
500.0000 mg | ORAL_TABLET | Freq: Two times a day (BID) | ORAL | Status: DC | PRN
  Administered 2022-06-06 (×2): 500 mg via ORAL
  Filled 2022-06-06 (×2): qty 1

## 2022-06-06 MED ORDER — LORAZEPAM 1 MG PO TABS: 1.0000 mg | ORAL_TABLET | Freq: Two times a day (BID) | ORAL | Status: DC

## 2022-06-06 MED ORDER — NICOTINE 14 MG/24HR TD PT24
14.0000 mg | MEDICATED_PATCH | Freq: Every day | TRANSDERMAL | Status: DC
  Administered 2022-06-06: 14 mg via TRANSDERMAL
  Filled 2022-06-06: qty 1

## 2022-06-06 MED ORDER — LOPERAMIDE HCL 2 MG PO CAPS: 2.0000 mg | ORAL_CAPSULE | ORAL | Status: DC | PRN

## 2022-06-06 MED ORDER — TRAZODONE HCL 150 MG PO TABS
150.0000 mg | ORAL_TABLET | Freq: Every day | ORAL | Status: DC
  Administered 2022-06-06: 150 mg via ORAL
  Filled 2022-06-06: qty 1

## 2022-06-06 MED ORDER — ACETAMINOPHEN 325 MG PO TABS: 650.0000 mg | ORAL_TABLET | Freq: Four times a day (QID) | ORAL | Status: DC | PRN

## 2022-06-06 MED ORDER — ONDANSETRON 4 MG PO TBDP: 4.0000 mg | ORAL_TABLET | Freq: Four times a day (QID) | ORAL | Status: DC | PRN

## 2022-06-06 MED ORDER — LORAZEPAM 1 MG PO TABS: 1.0000 mg | ORAL_TABLET | Freq: Three times a day (TID) | ORAL | Status: DC

## 2022-06-06 MED ORDER — METHOCARBAMOL 500 MG PO TABS
500.0000 mg | ORAL_TABLET | Freq: Three times a day (TID) | ORAL | Status: DC | PRN
  Administered 2022-06-06: 500 mg via ORAL
  Filled 2022-06-06: qty 1

## 2022-06-06 MED ORDER — ALBUTEROL SULFATE HFA 108 (90 BASE) MCG/ACT IN AERS
2.0000 | INHALATION_SPRAY | Freq: Four times a day (QID) | RESPIRATORY_TRACT | Status: DC | PRN
  Administered 2022-06-06: 2 via RESPIRATORY_TRACT
  Filled 2022-06-06: qty 6.7

## 2022-06-06 NOTE — BH Assessment (Signed)
Comprehensive Clinical Assessment (CCA) Note  06/06/2022 Scott Waller [id number]  Disposition: Per Doran Heater, NP inpatient treatment is recommended.  [facility] to review.  Disposition SW to pursue appropriate inpatient options.  The patient demonstrates the following risk factors for suicide: Chronic risk factors for suicide include: psychiatric disorder of MDD vs Bipolar and substance use disorder. Acute risk factors for suicide include: social withdrawal/isolation and loss (financial, interpersonal, professional). Protective factors for this patient include: positive social support, responsibility to others (children, family), and hope for the future. Considering these factors, the overall suicide risk at this point appears to be high. Patient is not appropriate for outpatient follow up.  Patient is a 41 year old male with a history of Alcohol Use Disorder, severe and Major Depressive Disorder vs Bipolar Disorder who presents voluntarily to [facility] for assessment.  Patient presents with wife requesting "help" stating he is ready to enter detox and "come off all of it."  Patient preferred that wife stay for the assessment. Patient appears intoxicated and states he just had 2-3 shots of vodka and and a beer PTA.  He has a complex medical hx with chronic pain issues related to psoriatic arthritis, for which he receives monthly injections of cosentyx.  He also has a hx of Afib, stint and recent acute renal failure on 7/18 for which he was admitted to [facility].  He was started on librium in the hospital to address withdrawal symptoms. He states he did detox, however "it wasn't enough treatment" as he left the hospital and immediately bought a bottle of Vodka.  He has been drinking a fifth to a fifth and 1/2 daily since.  Patient endorses SI, stating he had a plan this morning to shoot himself in the head with 42mm pistol which he did have access to briefly this morning.  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  Patient is not engaged in therapy and PCP is prescribing lamictal, which he reports has been helpful.  Patient's wife states she had secured the guns prior [place]  Patient states it was lying in a box as he was considering suicide this morning.  Wife has since secured the weapons again.   Patient denies HI and AVH. He again is insistent that he wants help, stating he wants to be better for his 47 y.o. son, whom he lists as a primary protective factor.  Treatment options were discussed and inpatient treatment has been recommended.  Patient is in agreement with recommendation and will consider residential SA treatment following inpatient treatment.   Chief Complaint: No chief complaint on file.  Visit Diagnosis: Alcohol Use Disorder, severe                             MDD vs Bipolar Disorder  Flowsheet Row ED from 06/06/2022 in [facility] Office Visit from [facility]  Thoughts that you would be better off dead, or of hurting yourself in some way More than half the days Several days  PHQ-9 Total Score 22 17      Flowsheet Row ED from 06/06/2022 in [facility] ED from 06/05/2022 in [facility]  ED to Hosp-Admission (Discharged) from 04/19/2022 in [facility] 2M KIDNEY UNIT  C-SSRS RISK CATEGORY Error: Q2 is Yes, you must answer 3, 4, and 5 No Risk No Risk       Risk isn't populating - Risk is High today   CCA Screening, Triage and Referral (STR)  Patient Reported Information How did you hear about [place]? Family/Friend  What Is the Reason for Your Visit/Call Today? Patient presents with wife requesting "help" stating he is ready to enter  detox and "come off all of it."  Patient appears intoxicated and states he just had 2-3 shots of vodka and and a beer PTA.  He has a complex medical hx with chronic pain issues related to psoriatic arthritis.  He also has a hx of Afib, stint and recent acute renal failure on 7/18 for which he was admitted to [facility].  He was started on librium in the hospital to address withdrawal symptoms. He states he did detox, however "it wasn't enough treatment" as he left the hospital and immediately bought a bottle of Vodka.  Patient endorses SI, stating he had a plan this morning to shoot himself in the head with 43mm pistol which he did have access to briefly this morning.  Patient denies HI and AVH.  How Long Has This Been Causing You Problems? > than 6 months  What Do You Feel Would Help You the Most Today? Alcohol or Drug Use Treatment; Treatment for Depression or other mood problem   Have You Recently Had Any Thoughts About Hurting Yourself? Yes  Are You Planning to Commit Suicide/Harm Yourself At This time? Yes   Have you Recently Had Thoughts About Hurting Someone [name]? No  Are You Planning to Harm Someone at This Time? No  Explanation: No data recorded  Have You Used Any Alcohol or Drugs in the Past 24 Hours? Yes  How Long Ago Did You Use Drugs or Alcohol? No data recorded What Did You Use and How Much? 2-3 shots of vodka and a beer, with 2 xanax   Do You Currently Have a Therapist/Psychiatrist? No  Name of Therapist/Psychiatrist: No data recorded  Have You Been Recently Discharged From Any Office Practice or Programs? No  Explanation of Discharge From Practice/Program: No data recorded    CCA Screening Triage Referral Assessment Type of Contact: Face-to-Face  Telemedicine Service Delivery:   Is this Initial or Reassessment? No data recorded Date Telepsych consult ordered in [facility]:  No data recorded Time Telepsych consult ordered in [facility]:  No data recorded Location of  Assessment: [facility]  Provider Location: [facility] Assessment Services   Collateral Involvement: N/A   Does Patient Have a Automotive engineer Guardian? No data recorded Name and Contact of Legal Guardian: No data recorded If Minor and Not Living with Parent(s), Who has Custody? No data recorded Is CPS involved or ever been involved? No data recorded Is APS involved or ever been involved? No data recorded  Patient Determined To Be At Risk for Harm To Self or Others Based on Review of Patient Reported Information or Presenting Complaint? No data recorded Method: No data recorded Availability of Means: No data recorded Intent: No data recorded Notification Required: No data recorded Additional Information for Danger to Others Potential: No data recorded Additional Comments for Danger to Others Potential: No data recorded Are There Guns or Other Weapons in Your Home? No data recorded Types of Guns/Weapons: No data recorded Are These Weapons Safely Secured?  No data recorded Who Could Verify You Are Able To Have These Secured: No data recorded Do You Have any Outstanding Charges, Pending Court Dates, Parole/Probation? No data recorded Contacted To Inform of Risk of Harm To Self or Others: No data recorded   Does Patient Present under Involuntary Commitment? No data recorded IVC Papers Initial File Date: No data recorded  [place] of Residence: No data recorded  Patient Currently Receiving the Following Services: No data recorded  Determination of Need: Urgent (48 hours)   Options For Referral: Inpatient Hospitalization; Facility-Based Crisis; BH Urgent Care     CCA Biopsychosocial Patient Reported Schizophrenia/Schizoaffective Diagnosis in Past: No   Strengths: Seeking treatment, has support   Mental Health Symptoms Depression:   Hopelessness; Tearfulness; Difficulty Concentrating; Change in energy/activity; Increase/decrease  in appetite; Sleep (too much or little); Worthlessness   Duration of Depressive symptoms:  Duration of Depressive Symptoms: Greater than two weeks   Mania:   None   Anxiety:    Worrying; Tension   Psychosis:   None   Duration of Psychotic symptoms:    Trauma:   None   Obsessions:   None   Compulsions:   None   Inattention:   None   Hyperactivity/Impulsivity:   N/A   Oppositional/Defiant Behaviors:   N/A   Emotional Irregularity:   N/A   Other Mood/Personality Symptoms:  No data recorded   Mental Status Exam Appearance and self-care  Stature:   Average   Weight:   Obese   Clothing:   Casual   Grooming:   Neglected   Cosmetic use:   None   Posture/gait:   Slumped   Motor activity:   Slowed   Sensorium  Attention:   Confused; Normal   Concentration:   Variable; Normal   Orientation:   Object; Person; Time; Place   Recall/memory:   Normal   Affect and Mood  Affect:   Blunted; Tearful   Mood:   Depressed   Relating  Eye contact:   Fleeting   Facial expression:   Depressed; Responsive   Attitude toward examiner:   Cooperative   Thought and Language  Speech flow:  Slurred   Thought content:   Appropriate to Mood and Circumstances   Preoccupation:   None   Hallucinations:   None   Organization:  No data recorded  [employer] of Knowledge:   Average   Intelligence:   Average   Abstraction:   Functional   Judgement:   Impaired   Reality Testing:   Adequate   Insight:   Gaps   Decision Making:   Impulsive; Vacilates   Social Functioning  Social Maturity:   Responsible   Social Judgement:   Normal   Stress  Stressors:   Relationship; Transitions; Work   Coping Ability:   Contractor Deficits:   Building services engineer; Self-control; Interpersonal; Responsibility   Supports:   Family; Friends/Service system     Religion: Religion/Spirituality Are You A  Religious Person?: No  Leisure/Recreation: Leisure / Recreation Do You Have Hobbies?: No  Exercise/Diet: Exercise/Diet Do You Exercise?: No Have You Gained or Lost A Significant Amount of Weight in the Past Six Months?: No Do You Follow a Special Diet?: No Do You Have Any Trouble Sleeping?: Yes Explanation of Sleeping Difficulties: sleeps 1-3 hours per night (naps) for past 3 months   CCA Employment/Education Employment/Work Situation: Employment / Work Situation Employment Situation: Employed Work Stressors: Patient reports struggling with move to  [employer], 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?: No  Education: Education Is Patient Currently Attending School?: No Last Grade Completed: 12 Did You Attend College?: No Did You Have An Individualized Education Program (IIEP): No Did You Have Any Difficulty At School?: No Patient's Education Has Been Impacted by Current Illness: No   CCA Family/Childhood History Family and Relationship History: Family history Marital status: Married Number of Years Married:  (NA) What types of issues is patient dealing with in the relationship?: Issues related to patient's ongoing alcohol use and recent depressive episode with SI. Does patient have children?: Yes How many children?: 1 How is patient's relationship with their children?: No concerns noted  Childhood History:  Childhood History By whom was/is the patient raised?: Mother, Father Did patient suffer any verbal/emotional/physical/sexual abuse as a child?: No Did patient suffer from severe childhood neglect?: No Has patient ever been sexually abused/assaulted/raped as an adolescent or adult?: No Was  the patient ever a victim of a crime or a disaster?: No Witnessed domestic violence?: No Has patient been affected by domestic violence as an adult?: No  Child/Adolescent Assessment:     CCA Substance Use Alcohol/Drug Use: Alcohol / Drug Use Pain Medications: See MAR Prescriptions: See MAR Over the Counter: See MAR History of alcohol / drug use?: Yes Longest period of sobriety (when/how long): 1 week - recently hospitalized for Acute Kidney failure - detoxed while admitted with librium taper Negative Consequences of Use: Financial, Personal relationships, Work / School Withdrawal Symptoms: Change in blood pressure, Patient aware of relationship between substance abuse and physical/medical complications, Sweats, Tremors, Weakness Substance #1 Name of Substance 1: ETOH 1 - Age of First Use: teens 1 - Amount (size/oz): 1/5 to 1/5 + 1/2 1 - Frequency: daily 1 - Duration: 4 yrs - drank less and less often prior to this period 1 - Last Use / Amount: PTA - 2-3 shots and a beer 1 - Method of Aquiring: NA 1- Route of Use: drinks                       ASAM's:  Six Dimensions of Multidimensional Assessment  Dimension 1:  Acute Intoxication and/or Withdrawal Potential:   Dimension 1:  Description of individual's past and current experiences of substance use and withdrawal: Current intoxication  Dimension 2:  Biomedical Conditions and Complications:   Dimension 2:  Description of patient's biomedical conditions and  complications: Adequately copes with physical discomfort  Dimension 3:  Emotional, Behavioral, or Cognitive Conditions and Complications:  Dimension 3:  Description of emotional, behavioral, or cognitive conditions and complications: Underlying depression, Rx Lamichtal, however continues to drink  Dimension 4:  Readiness to Change:  Dimension 4:  Description of Readiness to Change criteria: States he is motivated to "come off alcohol and all pain meds too"  Dimension 5:   Relapse, Continued use, or Continued Problem Potential:  Dimension 5:  Relapse, continued use, or continued problem potential critiera description: Limited understanding of MI and SA relapse issues  Dimension 6:  Recovery/Living Environment:  Dimension 6:  Recovery/Iiving environment criteria description: wife is supportive  ASAM Severity Score: ASAM's Severity Rating Score: 6  ASAM Recommended  Level of Treatment: ASAM Recommended Level of Treatment: Level II Intensive Outpatient Treatment   Substance use Disorder (SUD) Substance Use Disorder (SUD)  Checklist Symptoms of Substance Use: Continued use despite persistent or recurrent social, interpersonal problems, caused or exacerbated by use, Evidence of tolerance, Persistent desire or unsuccessful efforts to cut down or control use, Presence of craving or strong urge to use, Recurrent use that results in a failure to fulfill major role obligations (work, school, home), Social, occupational, recreational activities given up or reduced due to use, Substance(s) often taken in larger amounts or over longer times than was intended  Recommendations for Services/Supports/Treatments: Recommendations for Services/Supports/Treatments Recommendations For Services/Supports/Treatments: CD-IOP Intensive Chemical Dependency Program, Inpatient Hospitalization  Discharge Disposition:    DSM5 Diagnoses: Patient Active Problem List   Diagnosis Date Noted   Hypomagnesemia 04/21/2022   Transaminitis 04/21/2022   AKI (acute kidney injury) (HCC)    Acute kidney injury (HCC) 04/20/2022   Alcohol abuse with withdrawal without complication (HCC) 04/20/2022   Hypotension 04/20/2022   Chronic pain 04/20/2022   CAD (coronary artery disease) 11/07/2018   Tobacco abuse 11/07/2018   Hyperlipidemia LDL goal <70 11/07/2018   Morbid obesity with BMI of 45.0-49.9, adult (HCC) 09/30/2017   Back pain 11/24/2016   Essential hypertension 11/24/2016   Herniated lumbar  intervertebral disc 07/18/2016   Psoriatic arthritis (HCC) 07/13/2015   Generalized anxiety disorder 08/31/2014   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

## 2022-06-06 NOTE — ED Notes (Signed)
Dressings to lower legs removed by pt.  MHT Autumn at bedside to assist with removal of dressings and proper disposal

## 2022-06-06 NOTE — ED Provider Notes (Signed)
[facility] Urgent Care Continuous Assessment Admission H&P  Date: 06/06/22 Patient Name: Scott Waller MRN: [id number] Chief Complaint: No chief complaint on file.     Diagnoses:  Final diagnoses:  Suicidal ideation  Alcohol use disorder    HPI: Patient presents voluntarily to [facility] behavioral health for walk-in assessment.  Patient is accompanied by his wife, Bonita Quin, who remains present during assessment as patient prefers.  Patient is assessed, face-to-face, by nurse practitioner, seated in assessment area, no acute distress.  He  is alert and oriented, pleasant and cooperative during assessment.   Patient  presents with depressed mood, tearful affect.  He endorses suicidal ideations morning with a plan to "blow my head off" with a gun at his home earlier today.  Patient and his wife report increasing suicidal ideations over 2 to 3 weeks.  Patient telephoned mental health crisis hotline 5 days ago to ask for "help."  At that time he also called a family member to remove his 43-year-old son from the home.  He  denies suicidal and homicidal ideations currently. Denies history of suicide attempts, denies history of nonsuicidal self-harm behavior.   Recent stressors include "I ran out of my oxycodone, until next Friday."  Patient reports he had a fall on yesterday and "scratched his legs" causing him to use more oxycodone than prescribed.  He states "I would like to get off of all these medicines, oxycodone and Xanax."  Patient also endorses alcohol use disorder.  He reports using 1/5 of liquor per day for approximately 4 years.  Last alcohol use earlier this date when he ingested 3 drinks.  Patient denies history of alcohol-related seizure.  Denies history of delirium tremens.  He denies substance use aside from alcohol and prescribed medications.  He has not sought substance use treatment in the past.  Belford would like to seek residential substance use treatment once discharged from inpatient  psychiatric care.  He has reached out to [facility] as well as [facility].  He would prefer [facility] admission however he does not have $5000 deductible available to him currently.  Patient is also a tobacco cigarette smoker, daily.  Patient request nicotine patch.  Patient becomes frustrated when he is not permitted to "walk outside and smoke."  Bengie reports he was treated for anxiety by outpatient provider Dr. Carmel Sacramento at [facility] neurological.  Patient reports he believes [facility] neurological office is a "basically a pill mill"  He reports he is compliant with alprazolam 1 mg 3 times daily and Lamictal 200 mg twice daily.  He last took both medications earlier this date.  He denies history of inpatient psychiatric hospitalization.  Family mental health history includes patient's mother who has been diagnosed with anxiety and depression as well as patient's maternal grandmother who had history of both anxiety and depression.  He is not linked with outpatient psychiatry, no current outpatient counseling.  Patient is seen by outpatient primary care as well as cardiology.  He is compliant with medications as prescribed including trazodone, metoprolol, potassium and multivitamin.  Patient reports cardiologist stopped both his aspirin and his Plavix medications 1 week ago.    Patient has normal speech and behavior.  He  denies auditory and visual hallucinations.  Patient is able to converse coherently with goal-directed thoughts and no distractibility or preoccupation.  Denies symptoms of paranoia.  Objectively there is no evidence of psychosis/mania or delusional thinking.  Patient resides in [place] with his wife and 7-year-old son.  Patient's wife has removed  ammunition from weapons in home aside from 1 weapon that she plans to remove the ammunition from later today.  Patient is employed in the [employer].  Patient endorses decreased sleep and appetite for 2 to 3 weeks.  He is uncertain whether  or not he has lost weight without trying.  He states "I have not been able to eat."  Patient offered support and encouragement.  Patient's wife, Bonita Quin, reports concern for patient's safety as he has "talked about suicide" increasingly over several weeks.  Reviewed treatment plan to include involuntary commitment and inpatient psychiatric hospitalization, patient verbalizes understanding of treatment plan.   PHQ 2-9:  Flowsheet Row Office Visit from 11/24/2016 [facility] [place]  Thoughts that you would be better off dead, or of hurting yourself in some way Several days  PHQ-9 Total Score 17       Flowsheet Row ED from 06/05/2022 in [facility] to Hosp-Admission (Discharged) from 04/19/2022 in [facility] 33M KIDNEY UNIT  C-SSRS RISK CATEGORY No Risk No Risk        Total Time spent with patient: 30 minutes  Musculoskeletal  Strength & Muscle Tone: within normal limits Gait & Station: normal Patient leans: N/A  Psychiatric Specialty Exam  Presentation General Appearance: Appropriate for Environment; Casual  Eye Contact:Fair  Speech:Clear and Coherent; Normal Rate  Speech Volume:Normal  Handedness:Right   Mood and Affect  Mood:Depressed  Affect:Depressed; Tearful   Thought Process  Thought Processes:Coherent; Goal Directed; Linear  Descriptions of Associations:Intact  Orientation:Full (Time, Place and Person)  Thought Content:Logical    Hallucinations:Hallucinations: None  Ideas of Reference:None  Suicidal Thoughts:Suicidal Thoughts: No (Suicidal earlier this date with thought to "blow my head off")  Homicidal Thoughts:Homicidal Thoughts: No   Sensorium  Memory:Immediate Good; Recent Fair  Judgment:Intact  Insight:Present   Executive Functions  Concentration:Fair  Attention Span:Fair  Recall:Good  Fund of Knowledge:Good  Language:Good   Psychomotor Activity   Psychomotor Activity:Psychomotor Activity: Normal   Assets  Assets:Communication Skills; Desire for Improvement; Financial Resources/Insurance; Housing; Intimacy; Leisure Time; Social Support   Sleep  Sleep:Sleep: Poor   Nutritional Assessment (For OBS and FBC admissions only) Has the patient had a weight loss or gain of 10 pounds or more in the last 3 months?: No Has the patient had a decrease in food intake/or appetite?: Yes Does the patient have dental problems?: No Does the patient have eating habits or behaviors that may be indicators of an eating disorder including binging or inducing vomiting?: No Has the patient recently lost weight without trying?: 2.0 Has the patient been eating poorly because of a decreased appetite?: 1 Malnutrition Screening Tool Score: 3 Nutritional Assessment Referrals: Medication/Tx changes    Physical Exam Vitals and nursing note reviewed.  Constitutional:      Appearance: Normal appearance. He is well-developed. He is obese.  HENT:     Head: Normocephalic and atraumatic.     Nose: Nose normal.  Cardiovascular:     Rate and Rhythm: Normal rate.  Pulmonary:     Effort: Pulmonary effort is normal.  Musculoskeletal:        General: Normal range of motion.     Cervical back: Normal range of motion.  Skin:    General: Skin is warm and dry.     Comments: Abrasion to left posterior wrist, no drainage, no evidence inflammation, scab intact, open to air. Patient reports abrasions to bilateral lower extremities after fall on yesterday, legs  currently bandaged.   Neurological:     Mental Status: He is alert and oriented to person, place, and time.  Psychiatric:        Attention and Perception: Attention and perception normal.        Mood and Affect: Mood is depressed. Affect is tearful.        Speech: Speech normal.        Behavior: Behavior normal. Behavior is cooperative.        Thought Content: Thought content normal.        Cognition and  Memory: Cognition and memory normal.    Review of Systems  Constitutional: Negative.   HENT: Negative.    Eyes: Negative.   Respiratory: Negative.    Cardiovascular: Negative.   Gastrointestinal: Negative.   Genitourinary: Negative.   Musculoskeletal: Negative.   Skin: Negative.   Neurological: Negative.   Psychiatric/Behavioral:  Positive for depression, substance abuse and suicidal ideas. The patient has insomnia.     Blood pressure (!) 149/78, pulse 82, temperature 98.3 F (36.8 C), temperature source Oral, resp. rate 19, SpO2 96 %. There is no height or weight on file to calculate BMI.  Past Psychiatric History: Alcohol use disorder, generalized anxiety disorder  Is the patient at risk to self? Yes  Has the patient been a risk to self in the past 6 months? Yes .    Has the patient been a risk to self within the distant past? No   Is the patient a risk to others? No   Has the patient been a risk to others in the past 6 months? No   Has the patient been a risk to others within the distant past? No   Past Medical History:  Past Medical History:  Diagnosis Date   AKI (acute kidney injury) (HCC)    Anxiety and depression    Arthritis    Asthma    Bronchitis    CAD (coronary artery disease)    a. 10/15/2018 DES to prox LAD   GERD (gastroesophageal reflux disease)    History of atrial fibrillation    During alcohol binge   Hyperlipidemia with target LDL less than 70    Hypertension    Tobacco abuse     Past Surgical History:  Procedure Laterality Date   APPENDECTOMY     CORONARY STENT INTERVENTION N/A 10/15/2018   Procedure: CORONARY STENT INTERVENTION;  Surgeon: Corky Crafts, MD;  Location: [facility] INVASIVE CV LAB;  Service: Cardiovascular;  Laterality: N/A;   INTRAVASCULAR ULTRASOUND/IVUS N/A 10/15/2018   Procedure: Intravascular Ultrasound/IVUS;  Surgeon: Corky Crafts, MD;  Location: [facility] INVASIVE CV LAB;  Service: Cardiovascular;  Laterality: N/A;   LEFT  HEART CATH AND CORONARY ANGIOGRAPHY N/A 10/15/2018   Procedure: LEFT HEART CATH AND CORONARY ANGIOGRAPHY;  Surgeon: Corky Crafts, MD;  Location: [facility] INVASIVE CV LAB;  Service: Cardiovascular;  Laterality: N/A;    Family History:  Family History  Problem Relation Age of Onset   Hypertension Father     Social History:  Social History   Socioeconomic History   Marital status: Married    Spouse name: Not on file   Number of children: Not on file   Years of education: Not on file   Highest education level: Not on file  Occupational History   Not on file  Tobacco Use   Smoking status: Every Day    Packs/day: 1.00    Years: 25.00    Total pack years: 25.00  Types: Cigarettes   Smokeless tobacco: Never  Vaping Use   Vaping Use: Never used  Substance and Sexual Activity   Alcohol use: Yes    Alcohol/week: 20.0 - 24.0 standard drinks of alcohol    Types: 20 - 24 Cans of beer per week   Drug use: No   Sexual activity: Yes    Partners: Female  Other Topics Concern   Not on file  Social History Narrative   Not on file   Social Determinants of Health   Financial Resource Strain: Not on file  Food Insecurity: Not on file  Transportation Needs: Not on file  Physical Activity: Not on file  Stress: Not on file  Social Connections: Not on file  Intimate Partner Violence: Not on file    SDOH:  SDOH Screenings   Tobacco Use: High Risk (06/05/2022)    Last Labs:  Admission on 06/05/2022, Discharged on 06/05/2022  Component Date Value Ref Range Status   Sodium 06/05/2022 136  135 - 145 mmol/L Final   Potassium 06/05/2022 3.6  3.5 - 5.1 mmol/L Final   Chloride 06/05/2022 93 (L)  98 - 111 mmol/L Final   CO2 06/05/2022 26  22 - 32 mmol/L Final   Glucose, Bld 06/05/2022 80  70 - 99 mg/dL Final   Glucose reference range applies only to samples taken after fasting for at least 8 hours.   BUN 06/05/2022 10  6 - 20 mg/dL Final   Creatinine, Ser 06/05/2022 0.97  0.61 - 1.24  mg/dL Final   Calcium 40/98/119109/12/2021 9.2  8.9 - 10.3 mg/dL Final   Total Protein 47/82/956209/12/2021 7.4  6.5 - 8.1 g/dL Final   Albumin 13/08/657809/12/2021 3.5  3.5 - 5.0 g/dL Final   AST 46/96/295209/12/2021 99 (H)  15 - 41 U/L Final   ALT 06/05/2022 51 (H)  0 - 44 U/L Final   Alkaline Phosphatase 06/05/2022 86  38 - 126 U/L Final   Total Bilirubin 06/05/2022 2.5 (H)  0.3 - 1.2 mg/dL Final   GFR, Estimated 06/05/2022 >60  >60 mL/min Final   Comment: (NOTE) Calculated using the CKD-EPI Creatinine Equation (2021)    Anion gap 06/05/2022 17 (H)  5 - 15 Final   Performed at [facility], [address] [place], [place] [address]   WBC 06/05/2022 4.5  4.0 - 10.5 K/uL Final   RBC 06/05/2022 3.96 (L)  4.22 - 5.81 MIL/uL Final   Hemoglobin 06/05/2022 13.6  13.0 - 17.0 g/dL Final   HCT 44/01/027209/12/2021 40.4  39.0 - 52.0 % Final   MCV 06/05/2022 102.0 (H)  80.0 - 100.0 fL Final   MCH 06/05/2022 34.3 (H)  26.0 - 34.0 pg Final   MCHC 06/05/2022 33.7  30.0 - 36.0 g/dL Final   RDW 53/66/440309/12/2021 16.0 (H)  11.5 - 15.5 % Final   Platelets 06/05/2022 125 (L)  150 - 400 K/uL Final   nRBC 06/05/2022 0.0  0.0 - 0.2 % Final   Neutrophils Relative % 06/05/2022 76  % Final   Neutro Abs 06/05/2022 3.4  1.7 - 7.7 K/uL Final   Lymphocytes Relative 06/05/2022 16  % Final   Lymphs Abs 06/05/2022 0.7  0.7 - 4.0 K/uL Final   Monocytes Relative 06/05/2022 7  % Final   Monocytes Absolute 06/05/2022 0.3  0.1 - 1.0 K/uL Final   Eosinophils Relative 06/05/2022 0  % Final   Eosinophils Absolute 06/05/2022 0.0  0.0 - 0.5 K/uL Final   Basophils Relative 06/05/2022  1  % Final   Basophils Absolute 06/05/2022 0.0  0.0 - 0.1 K/uL Final   Immature Granulocytes 06/05/2022 0  % Final   Abs Immature Granulocytes 06/05/2022 0.01  0.00 - 0.07 K/uL Final   Performed at [facility], [address] [place], [place] [address]   Alcohol, Ethyl (B) 06/05/2022 248 (H)  <10 mg/dL Final   Comment: (NOTE) Lowest detectable limit for  serum alcohol is 10 mg/dL.  For medical purposes only. Performed at [facility], [address] [place], [place] [address]    Opiates 06/05/2022 POSITIVE (A)  NONE DETECTED Final   Cocaine 06/05/2022 NONE DETECTED  NONE DETECTED Final   Benzodiazepines 06/05/2022 POSITIVE (A)  NONE DETECTED Final   Amphetamines 06/05/2022 NONE DETECTED  NONE DETECTED Final   Tetrahydrocannabinol 06/05/2022 NONE DETECTED  NONE DETECTED Final   Barbiturates 06/05/2022 NONE DETECTED  NONE DETECTED Final   Comment: (NOTE) DRUG SCREEN FOR MEDICAL PURPOSES ONLY.  IF CONFIRMATION IS NEEDED FOR ANY PURPOSE, NOTIFY LAB WITHIN 5 DAYS.  LOWEST DETECTABLE LIMITS FOR URINE DRUG SCREEN Drug Class                     Cutoff (ng/mL) Amphetamine and metabolites    1000 Barbiturate and metabolites    200 Benzodiazepine                 200 Tricyclics and metabolites     300 Opiates and metabolites        300 Cocaine and metabolites        300 THC                            50 Performed at [facility], [address] [place], [place] [address]   No results displayed because visit has over 200 results.      Allergies: Statins  PTA Medications: (Not in a hospital admission)   Medical Decision Making  Patient reviewed with Dr. Nelly Rout.  Patient placed under involuntary commitment petition by this Clinical research associate.  Patient will be placed in observation area while awaiting inpatient psychiatric treatment.  Current medications: -Acetaminophen 650 mg every 6 as needed/mild pain -Maalox 30 mL oral every 4 as needed/digestion -Hydroxyzine 25 mg 3 times daily as needed/anxiety -Magnesium hydroxide 30 mL daily as needed/mild constipation -Nicotine NicoDerm CQ 14 mg transdermal daily  Restarted home medications including: -Albuterol Ventolin inhaler 2 puffs every 6 hours as needed/wheezing or shortness of breath -Lamotrigine 200 mg twice daily -Metoprolol XL 50 mg  daily -Potassium chloride 20 mEq daily -Trazodone 150 mg nightly as needed/sleep  CIWA Ativan protocol initiated: -Loperamide 2 to 4 mg oral as needed/diarrhea or loose stools -Lorazepam 1 mg 4 times daily x6 doses, 1 mg 3 times daily x3 doses, 1 mg 2 times daily x2 doses, 1 mg daily x1 dose -Lorazepam 1 mg every 6 hours as needed CIWA greater than 10 -Multivitamin with minerals 1 tablet daily -Ondansetron disintegrating tablet 4 mg every 6 as needed/nausea or vomiting -Thiamine injection 100 mg IM once -Thiamine tablet 100 mg daily  Clonidine Detox protocol initiated: -Clonidine 0.1 mg 4 daily x10 doses, clonidine 0.1 mg every morning and nightly x4 doses, clonidine 0.1 mg daily before breakfast x2 doses -Dicyclomine 20 mg every 6 hours as needed/spasms or abdominal cramping -Methocarbamol 500 mg every 8 hours as needed/muscle spasms -Naproxen 500 mg twice daily as needed/aching,  pain or discomfort -Ondansetron disintegrating tablet 4mg  every 8 hours as needed/nausea or vomiting     Recommendations  Based on my evaluation the patient does not appear to have an emergency medical condition.  , FNP 06/06/22  3:24 PM

## 2022-06-06 NOTE — Progress Notes (Signed)
   06/06/22 1444  [facility] Triage Screening (Walk-ins at [facility] only)  How Did You Hear About [place]? Family/Friend  What Is the Reason for Your Visit/Call Today? Patient presents with wife requesting "help" stating he is ready to enter detox and "come off all of it."  Patient appears intoxicated and states he just had 2-3 shots of vodka and and a beer PTA.  He has a complex medical hx with chronic pain issues related to psoriatic arthritis.  He also has a hx of Afib, stint and recent acute renal failure on 7/18 for which he was admitted to [facility].  He was started on librium in the hospital to address withdrawal symptoms. He states he did detox, however "it wasn't enough treatment" as he left the hospital and immediately bought a bottle of Vodka.  Patient endorses SI, stating he had a plan this morning to shoot himself in the head with 64mm pistol which he did have access to briefly this morning.  Patient denies HI and AVH.  How Long Has This Been Causing You Problems? > than 6 months  Have You Recently Had Any Thoughts About Hurting Yourself? Yes  How long ago did you have thoughts about hurting yourself? This morning  Are You Planning to Commit Suicide/Harm Yourself At This time? Yes  Have you Recently Had Thoughts About Hurting Someone [name]? No  Are You Planning To Harm Someone At This Time? No  Are you currently experiencing any auditory, visual or other hallucinations? No  Have You Used Any Alcohol or Drugs in the Past 24 Hours? Yes  How long ago did you use Drugs or Alcohol? PTA  What Did You Use and How Much? 2-3 shots of vodka and a beer  Do you have any current medical co-morbidities that require immediate attention? Yes  Please describe current medical co-morbidities that require immediate attention: psoriatic arthritis, treated by neurologist, on oxycodone QID  Clinician description of patient physical appearance/behavior: Patient is intoxicated, flushed, bloodshot eyes, tearful and anxious on  arrival.  He is AAOx4  What Do You Feel Would Help You the Most Today? Alcohol or Drug Use Treatment;Treatment for Depression or other mood problem  If access to [facility] was not available, would you have sought care in the Emergency Department? Yes  Determination of Need Urgent (48 hours)  Options For Referral Inpatient Hospitalization;Facility-Based Crisis;BH Urgent Care

## 2022-06-06 NOTE — ED Notes (Signed)
Pt A&O x 4, resting on stretcher at present, no acute distress noted.  Monitoring for safety.

## 2022-06-06 NOTE — ED Notes (Signed)
Pt resting at present, reports being very uncomfortable on the stretcher, extra pillows given for comfort.  Monitoring for safety.  Pt is IVC.

## 2022-06-06 NOTE — ED Notes (Signed)
Patient initially present to [facility] with SI with wife. During work up patient reports "I lied about suicidal ideation because my friend said you all would keep me if I said that:. Patient denies SI/HI and AVH with this Clinical research associate. Patient admits to substance abuse. Encouragement and support provided. Patient was given a hot meal and oriented to unit.  No acute distress noted. Support and encouragement provided. Routine safety checks conducted according to facility protocol. Encouraged patient to notify staff if thoughts of harm toward self or others arise. Patient verbalize understanding and agreement. Will continue to monitor for safety.

## 2022-06-07 ENCOUNTER — Encounter: Payer: Self-pay | Admitting: Psychiatry

## 2022-06-07 ENCOUNTER — Inpatient Hospital Stay (HOSPITAL_COMMUNITY)
Admission: EM | Admit: 2022-06-07 | Discharge: 2022-06-09 | DRG: 309 | Disposition: A | Discharge status: Home / Self Care | Payer: Managed Care, Other (non HMO) | Attending: Internal Medicine | Admitting: Internal Medicine

## 2022-06-07 ENCOUNTER — Emergency Department (HOSPITAL_COMMUNITY): Payer: Managed Care, Other (non HMO)

## 2022-06-07 ENCOUNTER — Other Ambulatory Visit: Payer: Self-pay

## 2022-06-07 ENCOUNTER — Inpatient Hospital Stay (HOSPITAL_COMMUNITY): Admission: AD | Admit: 2022-06-07 | Payer: 59 | Source: Home / Self Care | Admitting: Emergency Medicine

## 2022-06-07 ENCOUNTER — Encounter (HOSPITAL_COMMUNITY): Payer: Self-pay

## 2022-06-07 DIAGNOSIS — Z046 Encounter for general psychiatric examination, requested by authority: Secondary | ICD-10-CM

## 2022-06-07 DIAGNOSIS — S81812A Laceration without foreign body, left lower leg, initial encounter: Secondary | ICD-10-CM | POA: Diagnosis present

## 2022-06-07 DIAGNOSIS — I10 Essential (primary) hypertension: Secondary | ICD-10-CM | POA: Diagnosis not present

## 2022-06-07 DIAGNOSIS — F17210 Nicotine dependence, cigarettes, uncomplicated: Secondary | ICD-10-CM | POA: Diagnosis present

## 2022-06-07 DIAGNOSIS — L4059 Other psoriatic arthropathy: Secondary | ICD-10-CM | POA: Diagnosis present

## 2022-06-07 DIAGNOSIS — Z20822 Contact with and (suspected) exposure to covid-19: Secondary | ICD-10-CM | POA: Diagnosis present

## 2022-06-07 DIAGNOSIS — Z955 Presence of coronary angioplasty implant and graft: Secondary | ICD-10-CM

## 2022-06-07 DIAGNOSIS — F29 Unspecified psychosis not due to a substance or known physiological condition: Secondary | ICD-10-CM | POA: Diagnosis not present

## 2022-06-07 DIAGNOSIS — Z23 Encounter for immunization: Secondary | ICD-10-CM

## 2022-06-07 DIAGNOSIS — I25118 Atherosclerotic heart disease of native coronary artery with other forms of angina pectoris: Secondary | ICD-10-CM

## 2022-06-07 DIAGNOSIS — R17 Unspecified jaundice: Secondary | ICD-10-CM | POA: Diagnosis present

## 2022-06-07 DIAGNOSIS — J45909 Unspecified asthma, uncomplicated: Secondary | ICD-10-CM | POA: Diagnosis present

## 2022-06-07 DIAGNOSIS — E876 Hypokalemia: Secondary | ICD-10-CM | POA: Diagnosis present

## 2022-06-07 DIAGNOSIS — I2510 Atherosclerotic heart disease of native coronary artery without angina pectoris: Secondary | ICD-10-CM | POA: Diagnosis present

## 2022-06-07 DIAGNOSIS — R7401 Elevation of levels of liver transaminase levels: Secondary | ICD-10-CM | POA: Diagnosis present

## 2022-06-07 DIAGNOSIS — K219 Gastro-esophageal reflux disease without esophagitis: Secondary | ICD-10-CM | POA: Diagnosis present

## 2022-06-07 DIAGNOSIS — F1120 Opioid dependence, uncomplicated: Secondary | ICD-10-CM | POA: Diagnosis present

## 2022-06-07 DIAGNOSIS — I499 Cardiac arrhythmia, unspecified: Secondary | ICD-10-CM | POA: Diagnosis not present

## 2022-06-07 DIAGNOSIS — F10139 Alcohol abuse with withdrawal, unspecified: Secondary | ICD-10-CM | POA: Diagnosis present

## 2022-06-07 DIAGNOSIS — I480 Paroxysmal atrial fibrillation: Principal | ICD-10-CM | POA: Diagnosis present

## 2022-06-07 DIAGNOSIS — R45851 Suicidal ideations: Secondary | ICD-10-CM | POA: Diagnosis present

## 2022-06-07 DIAGNOSIS — Z7902 Long term (current) use of antithrombotics/antiplatelets: Secondary | ICD-10-CM

## 2022-06-07 DIAGNOSIS — F411 Generalized anxiety disorder: Secondary | ICD-10-CM | POA: Diagnosis present

## 2022-06-07 DIAGNOSIS — S81811A Laceration without foreign body, right lower leg, initial encounter: Secondary | ICD-10-CM | POA: Diagnosis present

## 2022-06-07 DIAGNOSIS — I4891 Unspecified atrial fibrillation: Secondary | ICD-10-CM | POA: Diagnosis present

## 2022-06-07 DIAGNOSIS — F10130 Alcohol abuse with withdrawal, uncomplicated: Secondary | ICD-10-CM | POA: Diagnosis present

## 2022-06-07 DIAGNOSIS — Z8249 Family history of ischemic heart disease and other diseases of the circulatory system: Secondary | ICD-10-CM

## 2022-06-07 DIAGNOSIS — Z6841 Body Mass Index (BMI) 40.0 and over, adult: Secondary | ICD-10-CM

## 2022-06-07 DIAGNOSIS — F1129 Opioid dependence with unspecified opioid-induced disorder: Secondary | ICD-10-CM

## 2022-06-07 DIAGNOSIS — E785 Hyperlipidemia, unspecified: Secondary | ICD-10-CM | POA: Diagnosis present

## 2022-06-07 DIAGNOSIS — L4050 Arthropathic psoriasis, unspecified: Secondary | ICD-10-CM | POA: Diagnosis present

## 2022-06-07 DIAGNOSIS — D696 Thrombocytopenia, unspecified: Secondary | ICD-10-CM | POA: Diagnosis present

## 2022-06-07 DIAGNOSIS — Z79899 Other long term (current) drug therapy: Secondary | ICD-10-CM

## 2022-06-07 DIAGNOSIS — F1123 Opioid dependence with withdrawal: Secondary | ICD-10-CM | POA: Diagnosis present

## 2022-06-07 DIAGNOSIS — G4700 Insomnia, unspecified: Secondary | ICD-10-CM | POA: Diagnosis present

## 2022-06-07 DIAGNOSIS — R002 Palpitations: Secondary | ICD-10-CM | POA: Diagnosis not present

## 2022-06-07 DIAGNOSIS — F32A Depression, unspecified: Secondary | ICD-10-CM | POA: Diagnosis present

## 2022-06-07 DIAGNOSIS — W1830XA Fall on same level, unspecified, initial encounter: Secondary | ICD-10-CM | POA: Diagnosis present

## 2022-06-07 DIAGNOSIS — G894 Chronic pain syndrome: Secondary | ICD-10-CM | POA: Diagnosis present

## 2022-06-07 DIAGNOSIS — D7589 Other specified diseases of blood and blood-forming organs: Secondary | ICD-10-CM | POA: Diagnosis present

## 2022-06-07 DIAGNOSIS — R000 Tachycardia, unspecified: Secondary | ICD-10-CM | POA: Diagnosis not present

## 2022-06-07 DIAGNOSIS — Z720 Tobacco use: Secondary | ICD-10-CM | POA: Diagnosis present

## 2022-06-07 DIAGNOSIS — F10939 Alcohol use, unspecified with withdrawal, unspecified: Secondary | ICD-10-CM | POA: Diagnosis present

## 2022-06-07 DIAGNOSIS — F1210 Cannabis abuse, uncomplicated: Secondary | ICD-10-CM | POA: Diagnosis present

## 2022-06-07 DIAGNOSIS — F10129 Alcohol abuse with intoxication, unspecified: Secondary | ICD-10-CM | POA: Diagnosis present

## 2022-06-07 LAB — COMPREHENSIVE METABOLIC PANEL
ALT: 54 U/L — ABNORMAL HIGH (ref 0–44)
AST: 109 U/L — ABNORMAL HIGH (ref 15–41)
Albumin: 3.4 g/dL — ABNORMAL LOW (ref 3.5–5.0)
Alkaline Phosphatase: 90 U/L (ref 38–126)
Anion gap: 18 — ABNORMAL HIGH (ref 5–15)
BUN: <5 mg/dL — ABNORMAL LOW (ref 6–20)
CO2: 24 mmol/L (ref 22–32)
Calcium: 9.4 mg/dL (ref 8.9–10.3)
Chloride: 95 mmol/L — ABNORMAL LOW (ref 98–111)
Creatinine, Ser: 1.05 mg/dL (ref 0.61–1.24)
GFR, Estimated: >60 mL/min (ref >60)
Glucose, Bld: 90 mg/dL (ref 70–99)
Potassium: 3.3 mmol/L — ABNORMAL LOW (ref 3.5–5.1)
Sodium: 137 mmol/L (ref 135–145)
Total Bilirubin: 2.8 mg/dL — ABNORMAL HIGH (ref 0.3–1.2)
Total Protein: 7.3 g/dL (ref 6.5–8.1)

## 2022-06-07 LAB — MAGNESIUM: Magnesium: 1.8 mg/dL (ref 1.7–2.4)

## 2022-06-07 LAB — CBC WITH DIFFERENTIAL/PLATELET
Abs Immature Granulocytes: 0.01 10*3/uL (ref 0.00–0.07)
Basophils Absolute: 0 10*3/uL (ref 0.0–0.1)
Basophils Relative: 1 %
Eosinophils Absolute: 0 10*3/uL (ref 0.0–0.5)
Eosinophils Relative: 1 %
HCT: 42.4 % (ref 39.0–52.0)
Hemoglobin: 14.1 g/dL (ref 13.0–17.0)
Immature Granulocytes: 0 %
Lymphocytes Relative: 16 %
Lymphs Abs: 0.7 10*3/uL (ref 0.7–4.0)
MCH: 34.2 pg — ABNORMAL HIGH (ref 26.0–34.0)
MCHC: 33.3 g/dL (ref 30.0–36.0)
MCV: 102.9 fL — ABNORMAL HIGH (ref 80.0–100.0)
Monocytes Absolute: 0.5 10*3/uL (ref 0.1–1.0)
Monocytes Relative: 11 %
Neutro Abs: 2.9 10*3/uL (ref 1.7–7.7)
Neutrophils Relative %: 71 %
Platelets: 100 10*3/uL — ABNORMAL LOW (ref 150–400)
RBC: 4.12 MIL/uL — ABNORMAL LOW (ref 4.22–5.81)
RDW: 15.9 % — ABNORMAL HIGH (ref 11.5–15.5)
WBC: 4.1 10*3/uL (ref 4.0–10.5)
nRBC: 0 % (ref 0.0–0.2)

## 2022-06-07 LAB — APTT: aPTT: 33 seconds (ref 24–36)

## 2022-06-07 LAB — PROTIME-INR
INR: 1.1 (ref 0.8–1.2)
Prothrombin Time: 14.3 seconds (ref 11.4–15.2)

## 2022-06-07 LAB — LAMOTRIGINE LEVEL: Lamotrigine Lvl: 5.8 ug/mL (ref 2.0–20.0)

## 2022-06-07 LAB — VITAMIN B12: Vitamin B-12: 1233 pg/mL — ABNORMAL HIGH (ref 180–914)

## 2022-06-07 LAB — PHOSPHORUS: Phosphorus: 2.2 mg/dL — ABNORMAL LOW (ref 2.5–4.6)

## 2022-06-07 LAB — PROLACTIN: Prolactin: 10.2 ng/mL (ref 4.0–15.2)

## 2022-06-07 LAB — TROPONIN I (HIGH SENSITIVITY): Troponin I (High Sensitivity): 35 ng/L — ABNORMAL HIGH (ref <18)

## 2022-06-07 MED ORDER — LORAZEPAM 1 MG PO TABS
1.0000 mg | ORAL_TABLET | ORAL | Status: DC | PRN
  Administered 2022-06-07: 2 mg via ORAL
  Administered 2022-06-09: 1 mg via ORAL
  Administered 2022-06-09: 2 mg via ORAL
  Filled 2022-06-07 (×2): qty 2
  Filled 2022-06-07: qty 1

## 2022-06-07 MED ORDER — LOPERAMIDE HCL 2 MG PO CAPS: 2.0000 mg | ORAL_CAPSULE | ORAL | Status: DC | PRN

## 2022-06-07 MED ORDER — POTASSIUM CHLORIDE CRYS ER 20 MEQ PO TBCR
60.0000 meq | EXTENDED_RELEASE_TABLET | ORAL | Status: AC
Start: 2022-06-07 — End: 2022-06-07
  Administered 2022-06-07: 60 meq via ORAL
  Filled 2022-06-07 (×2): qty 3

## 2022-06-07 MED ORDER — HYDROXYZINE HCL 25 MG PO TABS
25.0000 mg | ORAL_TABLET | Freq: Four times a day (QID) | ORAL | Status: DC | PRN
  Administered 2022-06-07 – 2022-06-09 (×2): 25 mg via ORAL
  Filled 2022-06-07 (×2): qty 1

## 2022-06-07 MED ORDER — POTASSIUM CHLORIDE CRYS ER 20 MEQ PO TBCR: 20.0000 meq | EXTENDED_RELEASE_TABLET | Freq: Every day | ORAL | Status: DC

## 2022-06-07 MED ORDER — POTASSIUM CHLORIDE 10 MEQ/100ML IV SOLN
10.0000 meq | INTRAVENOUS | Status: DC
  Filled 2022-06-07: qty 100

## 2022-06-07 MED ORDER — HEPARIN (PORCINE) 25000 UT/250ML-% IV SOLN
1700.0000 [IU]/h | INTRAVENOUS | Status: DC
  Administered 2022-06-07: 1700 [IU]/h via INTRAVENOUS
  Filled 2022-06-07: qty 250

## 2022-06-07 MED ORDER — LORAZEPAM 2 MG/ML IJ SOLN
0.0000 mg | Freq: Four times a day (QID) | INTRAMUSCULAR | Status: AC
  Administered 2022-06-07 (×2): 1 mg via INTRAVENOUS
  Administered 2022-06-07 – 2022-06-08 (×3): 2 mg via INTRAVENOUS
  Administered 2022-06-08: 1 mg via INTRAVENOUS
  Filled 2022-06-07 (×6): qty 1

## 2022-06-07 MED ORDER — THIAMINE MONONITRATE 100 MG PO TABS
100.0000 mg | ORAL_TABLET | Freq: Every day | ORAL | Status: DC
  Administered 2022-06-07 – 2022-06-09 (×3): 100 mg via ORAL
  Filled 2022-06-07 (×5): qty 1

## 2022-06-07 MED ORDER — MUSCLE RUB 10-15 % EX CREA: TOPICAL_CREAM | CUTANEOUS | Status: DC | PRN

## 2022-06-07 MED ORDER — LORAZEPAM 2 MG/ML IJ SOLN: 1.0000 mg | INTRAMUSCULAR | Status: DC | PRN

## 2022-06-07 MED ORDER — DILTIAZEM HCL-DEXTROSE 125-5 MG/125ML-% IV SOLN (PREMIX)
5.0000 mg/h | INTRAVENOUS | Status: DC
  Administered 2022-06-07: 5 mg/h via INTRAVENOUS
  Filled 2022-06-07: qty 125

## 2022-06-07 MED ORDER — NICOTINE 21 MG/24HR TD PT24
21.0000 mg | MEDICATED_PATCH | Freq: Every day | TRANSDERMAL | Status: DC
  Administered 2022-06-07 – 2022-06-09 (×3): 21 mg via TRANSDERMAL
  Filled 2022-06-07 (×3): qty 1

## 2022-06-07 MED ORDER — ONDANSETRON HCL 4 MG/2ML IJ SOLN: 4.0000 mg | Freq: Four times a day (QID) | INTRAMUSCULAR | Status: DC | PRN

## 2022-06-07 MED ORDER — TORSEMIDE 20 MG PO TABS
40.0000 mg | ORAL_TABLET | Freq: Two times a day (BID) | ORAL | Status: DC
  Administered 2022-06-08 – 2022-06-09 (×3): 40 mg via ORAL
  Filled 2022-06-07 (×3): qty 2

## 2022-06-07 MED ORDER — ONDANSETRON HCL 4 MG PO TABS: 4.0000 mg | ORAL_TABLET | Freq: Four times a day (QID) | ORAL | Status: DC | PRN

## 2022-06-07 MED ORDER — DILTIAZEM LOAD VIA INFUSION
20.0000 mg | Freq: Once | INTRAVENOUS | Status: AC
  Administered 2022-06-07: 20 mg via INTRAVENOUS
  Filled 2022-06-07: qty 20

## 2022-06-07 MED ORDER — ENOXAPARIN SODIUM 40 MG/0.4ML IJ SOSY
40.0000 mg | PREFILLED_SYRINGE | INTRAMUSCULAR | Status: DC
  Filled 2022-06-07: qty 0.4

## 2022-06-07 MED ORDER — ALPRAZOLAM 0.5 MG PO TABS
1.0000 mg | ORAL_TABLET | Freq: Three times a day (TID) | ORAL | Status: DC | PRN
  Administered 2022-06-08 – 2022-06-09 (×2): 1 mg via ORAL
  Filled 2022-06-07 (×2): qty 2

## 2022-06-07 MED ORDER — THIAMINE HCL 100 MG/ML IJ SOLN
100.0000 mg | Freq: Every day | INTRAMUSCULAR | Status: DC
  Filled 2022-06-07: qty 2

## 2022-06-07 MED ORDER — MAGNESIUM HYDROXIDE 400 MG/5ML PO SUSP: 30.0000 mL | Freq: Every day | ORAL | Status: DC | PRN

## 2022-06-07 MED ORDER — MENTHOL (TOPICAL ANALGESIC) 7.5 % (ROLL) EX MISC: 1.0000 | Freq: Every day | CUTANEOUS | Status: DC | PRN

## 2022-06-07 MED ORDER — METHOCARBAMOL 500 MG PO TABS
500.0000 mg | ORAL_TABLET | Freq: Three times a day (TID) | ORAL | Status: DC | PRN
  Administered 2022-06-07: 500 mg via ORAL
  Filled 2022-06-07: qty 1

## 2022-06-07 MED ORDER — PANTOPRAZOLE SODIUM 40 MG PO TBEC
40.0000 mg | DELAYED_RELEASE_TABLET | Freq: Every day | ORAL | Status: DC | PRN
  Administered 2022-06-08: 40 mg via ORAL
  Filled 2022-06-07: qty 1

## 2022-06-07 MED ORDER — DILTIAZEM HCL ER COATED BEADS 240 MG PO CP24
240.0000 mg | ORAL_CAPSULE | Freq: Every day | ORAL | Status: DC
Start: 2022-06-07 — End: 2022-06-09
  Administered 2022-06-07 – 2022-06-09 (×3): 240 mg via ORAL
  Filled 2022-06-07 (×3): qty 1

## 2022-06-07 MED ORDER — FUROSEMIDE 10 MG/ML IJ SOLN
40.0000 mg | Freq: Once | INTRAMUSCULAR | Status: AC
  Administered 2022-06-07: 40 mg via INTRAVENOUS
  Filled 2022-06-07: qty 4

## 2022-06-07 MED ORDER — ACETAMINOPHEN 325 MG PO TABS: 650.0000 mg | ORAL_TABLET | Freq: Four times a day (QID) | ORAL | Status: DC | PRN

## 2022-06-07 MED ORDER — OXYCODONE HCL 5 MG PO TABS
10.0000 mg | ORAL_TABLET | Freq: Two times a day (BID) | ORAL | Status: DC | PRN
  Administered 2022-06-07 – 2022-06-09 (×6): 10 mg via ORAL
  Filled 2022-06-07 (×6): qty 2

## 2022-06-07 MED ORDER — ACETAMINOPHEN 650 MG RE SUPP: 650.0000 mg | Freq: Four times a day (QID) | RECTAL | Status: DC | PRN

## 2022-06-07 MED ORDER — CLOPIDOGREL BISULFATE 75 MG PO TABS
75.0000 mg | ORAL_TABLET | Freq: Every day | ORAL | Status: DC
  Administered 2022-06-07 – 2022-06-09 (×3): 75 mg via ORAL
  Filled 2022-06-07 (×3): qty 1

## 2022-06-07 MED ORDER — LORAZEPAM 2 MG/ML IJ SOLN
1.0000 mg | Freq: Once | INTRAMUSCULAR | Status: AC
  Administered 2022-06-07: 1 mg via INTRAVENOUS
  Filled 2022-06-07: qty 1

## 2022-06-07 MED ORDER — DICYCLOMINE HCL 20 MG PO TABS: 20.0000 mg | ORAL_TABLET | Freq: Four times a day (QID) | ORAL | Status: DC | PRN

## 2022-06-07 MED ORDER — ENOXAPARIN SODIUM 80 MG/0.8ML IJ SOSY
80.0000 mg | PREFILLED_SYRINGE | INTRAMUSCULAR | Status: DC
  Administered 2022-06-07 – 2022-06-08 (×2): 80 mg via SUBCUTANEOUS
  Filled 2022-06-07 (×3): qty 0.8

## 2022-06-07 MED ORDER — ALBUTEROL SULFATE (2.5 MG/3ML) 0.083% IN NEBU: 2.5000 mg | INHALATION_SOLUTION | Freq: Four times a day (QID) | RESPIRATORY_TRACT | Status: DC | PRN

## 2022-06-07 MED ORDER — LAMOTRIGINE 100 MG PO TABS
200.0000 mg | ORAL_TABLET | Freq: Two times a day (BID) | ORAL | Status: DC
  Administered 2022-06-07 – 2022-06-09 (×5): 200 mg via ORAL
  Filled 2022-06-07: qty 2
  Filled 2022-06-07: qty 8
  Filled 2022-06-07 (×2): qty 2

## 2022-06-07 MED ORDER — MAGNESIUM SULFATE 2 GM/50ML IV SOLN
2.0000 g | Freq: Once | INTRAVENOUS | Status: AC
  Administered 2022-06-07: 2 g via INTRAVENOUS
  Filled 2022-06-07: qty 50

## 2022-06-07 MED ORDER — HEPARIN BOLUS VIA INFUSION
4000.0000 [IU] | Freq: Once | INTRAVENOUS | Status: AC
  Administered 2022-06-07: 4000 [IU] via INTRAVENOUS
  Filled 2022-06-07: qty 4000

## 2022-06-07 MED ORDER — TRAZODONE HCL 50 MG PO TABS
150.0000 mg | ORAL_TABLET | Freq: Every day | ORAL | Status: DC
  Administered 2022-06-07 – 2022-06-08 (×2): 150 mg via ORAL
  Filled 2022-06-07 (×2): qty 1

## 2022-06-07 MED ORDER — DICLOFENAC SODIUM 1 % EX GEL
2.0000 g | Freq: Four times a day (QID) | CUTANEOUS | Status: DC | PRN
  Filled 2022-06-07: qty 100

## 2022-06-07 MED ORDER — FOLIC ACID 1 MG PO TABS
1.0000 mg | ORAL_TABLET | Freq: Every day | ORAL | Status: DC
  Administered 2022-06-07 – 2022-06-09 (×3): 1 mg via ORAL
  Filled 2022-06-07 (×3): qty 1

## 2022-06-07 MED ORDER — CLOBETASOL PROPIONATE 0.05 % EX CREA
1.0000 | TOPICAL_CREAM | Freq: Two times a day (BID) | CUTANEOUS | Status: DC | PRN
  Filled 2022-06-07: qty 15

## 2022-06-07 MED ORDER — ADULT MULTIVITAMIN W/MINERALS CH
1.0000 | ORAL_TABLET | Freq: Every day | ORAL | Status: DC
  Administered 2022-06-07 – 2022-06-09 (×3): 1 via ORAL
  Filled 2022-06-07 (×3): qty 1

## 2022-06-07 MED ORDER — LORAZEPAM 2 MG/ML IJ SOLN: 0.0000 mg | Freq: Two times a day (BID) | INTRAMUSCULAR | Status: DC

## 2022-06-07 MED ORDER — NAPROXEN 250 MG PO TABS: 500.0000 mg | ORAL_TABLET | Freq: Two times a day (BID) | ORAL | Status: DC | PRN

## 2022-06-07 NOTE — ED Triage Notes (Signed)
Pt BIB [facility] from [facility] where pt was IVC'd. Pt started c/o palpitations. When EMS arrived Pt was found to be in A-fib with a rate between 130-170. Pt was given 500 NS with EMS and rate came down between 110-130. Pt states he was told he had a-fib a long time ago but then was taken off his meds d/t not having issues anymore. Pt was at [facility] detoxing from alcohol and oxycodone and states his last oxy and drink of alcohol was this am.

## 2022-06-07 NOTE — ED Notes (Signed)
IVC paperwork delivered to RN Lorren as patient is being admitted.

## 2022-06-07 NOTE — Progress Notes (Signed)
Pt was accepted to [facility] 306-2  Dx: Alcohol Use Disorder, severe  MDD vs Bipolar Disorder  Pt meets inpatient criteria per Doran Heater, FNP   Attending Physician will be Singleton,MD   Report can be called to: - Child and Adolescence unit: [phone] -Adult unit: [phone]  Pt can arrive after: [facility] to coordinate with care team  Care Team notified: [facility] Fransico Michael, RN, Hansel Starling, RN, Marja Kays, RN, Charlestine Massed, RN, [employer], Bobetta Lime, RN, Leonard Lee,NP, Tyrell Antonio, RN, Rona Ravens, RN, and [employer], RN   [place], LCSWA 06/07/2022 @ 1:42 AM

## 2022-06-07 NOTE — Progress Notes (Addendum)
ANTICOAGULATION CONSULT NOTE - Initial Consult  Pharmacy Consult for heparin  Indication: atrial fibrillation  Allergies  Allergen Reactions   Statins Other (See Comments)    "Messes with legs"    Patient Measurements: Height: 6\' 3"  (190.5 cm) Weight: (!) 163.3 kg (360 lb) IBW/kg (Calculated) : 84.5 Heparin Dosing Weight: 122kg   Vital Signs: Temp: 98.1 F (36.7 C) (09/06 0740) Temp Source: Oral (09/06 0740) BP: 139/86 (09/06 0744) Pulse Rate: 103 (09/06 0744)  Labs: Recent Labs    06/05/22 1635 06/06/22 1620 06/07/22 0424  HGB 13.6 14.1 14.1  HCT 40.4 40.3 42.4  PLT 125* 123* 100*  CREATININE 0.97 0.96 1.05    Estimated Creatinine Clearance: 148.8 mL/min (by C-G formula based on SCr of 1.05 mg/dL).   Medical History: Past Medical History:  Diagnosis Date   AKI (acute kidney injury) (HCC)    Anxiety and depression    Arthritis    Asthma    Bronchitis    CAD (coronary artery disease)    a. 10/15/2018 DES to prox LAD   GERD (gastroesophageal reflux disease)    History of atrial fibrillation    During alcohol binge   Hyperlipidemia with target LDL less than 70    Hypertension    Tobacco abuse     Assessment: Patient initially presented with SI, however was found to be in Afib with RVR. Per chart review patient had a remote history of atrial fibrillation but was not on anticoagulation prior to arrival.   Current renal function and CBC are stable. No bleeding noted. Patient has received no anticoagulation while admitted.   Goal of Therapy:  Heparin level 0.3-0.7 units/ml Monitor platelets by anticoagulation protocol: Yes   Plan:  Give 4000 units bolus x 1 Start heparin infusion at 1700 units/hr Check anti-Xa level in 6 hours and daily while on heparin Continue to monitor H&H and platelets  10/17/2018 06/07/2022,7:54 AM

## 2022-06-07 NOTE — ED Notes (Signed)
ED TO INPATIENT HANDOFF REPORT  ED Nurse Name and Phone #:   S Name/Age/Gender Scott Waller 41 y.o. male Room/Bed: 021C/021C  Code Status   Code Status: Full Code  Home/SNF/Other Home Patient oriented to: self, place, time, and situation Is this baseline? Yes   Triage Complete: Triage complete  Chief Complaint Atrial fibrillation with rapid ventricular response (HCC) [I48.91]  Triage Note Pt BIB [facility] from [facility] where pt was IVC'd. Pt started c/o palpitations. When EMS arrived Pt was found to be in A-fib with a rate between 130-170. Pt was given 500 NS with EMS and rate came down between 110-130. Pt states he was told he had a-fib a long time ago but then was taken off his meds d/t not having issues anymore. 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        Allergies Allergies  Allergen Reactions   Statins Other (See Comments)    Myalgias    Level of Care/Admitting Diagnosis ED Disposition     ED Disposition  Admit   Condition  --   Comment  Hospital Area: [facility] [[id number]]  Level of Care: Progressive [102]  Admit to Progressive based on following criteria: CARDIOVASCULAR & THORACIC of moderate stability with acute coronary syndrome symptoms/low risk myocardial infarction/hypertensive urgency/arrhythmias/heart failure potentially compromising stability and stable post cardiovascular intervention patients.  Admit to Progressive based on following criteria: ACUTE MENTAL DISORDER-RELATED Drug/Alcohol Ingestion/Overdose/Withdrawal, Suicidal Ideation/attempt requiring safety sitter and < Q2h monitoring/assessments, moderate to severe agitation that is managed with medication/sitter, CIWA-Ar score < 20.  May place patient in observation at [facility] or [facility] if equivalent level of care is available:: No  Covid Evaluation: Asymptomatic - no recent exposure (last 10 days) testing not  required  Diagnosis: Atrial fibrillation with rapid ventricular response [facility]) [[id number]]  Admitting Physician: Magnus Ivan [[id number]]  Attending Physician: Magnus Ivan [[id number]]          B Medical/Surgery History Past Medical History:  Diagnosis Date   AKI (acute kidney injury) (HCC)    Anxiety and depression    Arthritis    Asthma    Bronchitis    CAD (coronary artery disease)    a. 10/15/2018 DES to prox LAD   GERD (gastroesophageal reflux disease)    History of atrial fibrillation    During alcohol binge   Hyperlipidemia with target LDL less than 70    Hypertension    Tobacco abuse    Past Surgical History:  Procedure Laterality Date   APPENDECTOMY     CORONARY STENT INTERVENTION N/A 10/15/2018   Procedure: CORONARY STENT INTERVENTION;  Surgeon: Corky Crafts, MD;  Location: [facility] INVASIVE CV LAB;  Service: Cardiovascular;  Laterality: N/A;   INTRAVASCULAR ULTRASOUND/IVUS N/A 10/15/2018   Procedure: Intravascular Ultrasound/IVUS;  Surgeon: Corky Crafts, MD;  Location: [facility] INVASIVE CV LAB;  Service: Cardiovascular;  Laterality: N/A;   LEFT HEART CATH AND CORONARY ANGIOGRAPHY N/A 10/15/2018   Procedure: LEFT HEART CATH AND CORONARY ANGIOGRAPHY;  Surgeon: Corky Crafts, MD;  Location: [facility] INVASIVE CV LAB;  Service: Cardiovascular;  Laterality: N/A;     A IV Location/Drains/Wounds Patient Lines/Drains/Airways Status     Active Line/Drains/Airways     Name Placement date Placement time Site Days   Peripheral IV 06/07/22 20 G Right Antecubital 06/07/22  0331  Antecubital  less than 1   Peripheral IV 06/07/22 20 G Right  Forearm 06/07/22  0732  Forearm  less than 1            Intake/Output Last 24 hours  Intake/Output Summary (Last 24 hours) at 06/07/2022 1919 Last data filed at 06/07/2022 1000 Gross per 24 hour  Intake --  Output 1100 ml  Net -1100 ml    Labs/Imaging Results for orders placed or performed during the hospital  encounter of 06/07/22 (from the past 48 hour(s))  CBC with Differential     Status: Abnormal   Collection Time: 06/07/22  4:24 AM  Result Value Ref Range   WBC 4.1 4.0 - 10.5 K/uL   RBC 4.12 (L) 4.22 - 5.81 MIL/uL   Hemoglobin 14.1 13.0 - 17.0 g/dL   HCT 96.2 83.6 - 62.9 %   MCV 102.9 (H) 80.0 - 100.0 fL   MCH 34.2 (H) 26.0 - 34.0 pg   MCHC 33.3 30.0 - 36.0 g/dL   RDW 47.6 (H) 54.6 - 50.3 %   Platelets 100 (L) 150 - 400 K/uL   nRBC 0.0 0.0 - 0.2 %   Neutrophils Relative % 71 %   Neutro Abs 2.9 1.7 - 7.7 K/uL   Lymphocytes Relative 16 %   Lymphs Abs 0.7 0.7 - 4.0 K/uL   Monocytes Relative 11 %   Monocytes Absolute 0.5 0.1 - 1.0 K/uL   Eosinophils Relative 1 %   Eosinophils Absolute 0.0 0.0 - 0.5 K/uL   Basophils Relative 1 %   Basophils Absolute 0.0 0.0 - 0.1 K/uL   Immature Granulocytes 0 %   Abs Immature Granulocytes 0.01 0.00 - 0.07 K/uL    Comment: Performed at [facility], [address] [place], [place] [address]  Comprehensive metabolic panel     Status: Abnormal   Collection Time: 06/07/22  4:24 AM  Result Value Ref Range   Sodium 137 135 - 145 mmol/L   Potassium 3.3 (L) 3.5 - 5.1 mmol/L   Chloride 95 (L) 98 - 111 mmol/L   CO2 24 22 - 32 mmol/L   Glucose, Bld 90 70 - 99 mg/dL    Comment: Glucose reference range applies only to samples taken after fasting for at least 8 hours.   BUN <5 (L) 6 - 20 mg/dL   Creatinine, Ser 8.12 0.61 - 1.24 mg/dL   Calcium 9.4 8.9 - 75.1 mg/dL   Total Protein 7.3 6.5 - 8.1 g/dL   Albumin 3.4 (L) 3.5 - 5.0 g/dL   AST 700 (H) 15 - 41 U/L   ALT 54 (H) 0 - 44 U/L   Alkaline Phosphatase 90 38 - 126 U/L   Total Bilirubin 2.8 (H) 0.3 - 1.2 mg/dL   GFR, Estimated >17 >49 mL/min    Comment: (NOTE) Calculated using the CKD-EPI Creatinine Equation (2021)    Anion gap 18 (H) 5 - 15    Comment: Performed at [facility], [address], [place], [place] [address]  Magnesium     Status: None   Collection Time: 06/07/22  4:24 AM   Result Value Ref Range   Magnesium 1.8 1.7 - 2.4 mg/dL    Comment: Performed at [facility], [address], [place], [place] [address]  Phosphorus     Status: Abnormal   Collection Time: 06/07/22  4:24 AM  Result Value Ref Range   Phosphorus 2.2 (L) 2.5 - 4.6 mg/dL    Comment: Performed at [facility], [address], [place], [place] [address]  Troponin I (High Sensitivity)     Status: Abnormal   Collection Time: 06/07/22  4:24 AM  Result Value Ref Range   Troponin I (High Sensitivity) 35 (H) <18 ng/L    Comment: (NOTE) Elevated high sensitivity troponin I (hsTnI) values and significant  changes across serial measurements may suggest ACS but many other  chronic and acute conditions are known to elevate hsTnI results.  Refer to the "Links" section for chest pain algorithms and additional  guidance. Performed at [facility], [address] [place], [place] [address]   Vitamin B12     Status: Abnormal   Collection Time: 06/07/22  9:47 AM  Result Value Ref Range   Vitamin B-12 1,233 (H) 180 - 914 pg/mL    Comment: (NOTE) This assay is not validated for testing neonatal or myeloproliferative syndrome specimens for Vitamin B12 levels. Performed at [facility], [address], [place], [place] [address]   Protime-INR     Status: None   Collection Time: 06/07/22  9:47 AM  Result Value Ref Range   Prothrombin Time 14.3 11.4 - 15.2 seconds   INR 1.1 0.8 - 1.2    Comment: (NOTE) INR goal varies based on device and disease states. Performed at [facility], [address] [place], [place] [address]   APTT     Status: None   Collection Time: 06/07/22  9:47 AM  Result Value Ref Range   aPTT 33 24 - 36 seconds    Comment: Performed at [facility], [address], [place], [place] [address]   DG Chest Portable 1 View  Result Date: 06/07/2022 CLINICAL DATA:  Evaluate for palpitation EXAM: PORTABLE CHEST 1 VIEW COMPARISON:  04/19/2022  FINDINGS: Artifact from EKG leads. Normal heart size and mediastinal contours for portable technique. Mild interstitial prominence which is similar to prior. No acute infiltrate or Kerley line. No effusion or pneumothorax. No acute osseous findings. IMPRESSION: Stable exam.  No evidence of acute disease. Electronically Signed   By: Tiburcio Pea M.D.   On: 06/07/2022 04:20    Pending Labs Unresulted Labs (From admission, onward)     Start     Ordered   06/08/22 0500  Basic metabolic panel  Tomorrow morning,   R        06/07/22 0727   06/08/22 0500  CBC  Daily at 5am,   R      06/07/22 0800   06/08/22 0500  Heparin level (unfractionated)  Daily at 5am,   R      06/07/22 0800   06/08/22 0500  Magnesium  Tomorrow morning,   R        06/07/22 1123            Vitals/Pain Today's Vitals   06/07/22 1500 06/07/22 1539 06/07/22 1704 06/07/22 1900  BP: (!) 144/61 (!) 144/61  (!) 153/94  Pulse: 85 83  91  Resp: 16   16  Temp:   98 F (36.7 C) 98.7 F (37.1 C)  TempSrc:      SpO2: 96%   96%  Weight:      Height:      PainSc:        Isolation Precautions No active isolations  Medications Medications  LORazepam (ATIVAN) tablet 1-4 mg (2 mg Oral Given 06/07/22 1546)    Or  LORazepam (ATIVAN) injection 1-4 mg ( Intravenous See Alternative 06/07/22 1546)  thiamine (VITAMIN B1) tablet 100 mg (100  mg Oral Given 06/07/22 0914)    Or  thiamine (VITAMIN B1) injection 100 mg ( Intravenous See Alternative 06/07/22 0914)  folic acid (FOLVITE) tablet 1 mg (1 mg Oral Given 06/07/22 0913)  multivitamin with minerals tablet 1 tablet (1 tablet Oral Given 06/07/22 0913)  acetaminophen (TYLENOL) tablet 650 mg (has no administration in time range)    Or  acetaminophen (TYLENOL) suppository 650 mg (has no administration in time range)  albuterol (PROVENTIL) (2.5 MG/3ML) 0.083% nebulizer solution 2.5 mg (has no administration in time range)  ondansetron (ZOFRAN) tablet 4 mg (has no administration in time  range)    Or  ondansetron (ZOFRAN) injection 4 mg (has no administration in time range)  LORazepam (ATIVAN) injection 0-4 mg (1 mg Intravenous Given 06/07/22 1228)    Followed by  LORazepam (ATIVAN) injection 0-4 mg (has no administration in time range)  oxyCODONE (Oxy IR/ROXICODONE) immediate release tablet 10 mg (10 mg Oral Given 06/07/22 0951)  nicotine (NICODERM CQ - dosed in mg/24 hours) patch 21 mg (21 mg Transdermal Patch Applied 06/07/22 0917)  dicyclomine (BENTYL) tablet 20 mg (has no administration in time range)  hydrOXYzine (ATARAX) tablet 25 mg (25 mg Oral Given 06/07/22 1546)  loperamide (IMODIUM) capsule 2-4 mg (has no administration in time range)  methocarbamol (ROBAXIN) tablet 500 mg (500 mg Oral Given 06/07/22 1224)  naproxen (NAPROSYN) tablet 500 mg (has no administration in time range)  diltiazem (CARDIZEM CD) 24 hr capsule 240 mg (240 mg Oral Given 06/07/22 1058)  torsemide (DEMADEX) tablet 40 mg (has no administration in time range)  ALPRAZolam (XANAX) tablet 1 mg (has no administration in time range)  traZODone (DESYREL) tablet 150 mg (has no administration in time range)  magnesium hydroxide (MILK OF MAGNESIA) suspension 30 mL (has no administration in time range)  pantoprazole (PROTONIX) EC tablet 40 mg (has no administration in time range)  clopidogrel (PLAVIX) tablet 75 mg (75 mg Oral Given 06/07/22 1224)  lamoTRIgine (LAMICTAL) tablet 200 mg (200 mg Oral Given 06/07/22 1225)  potassium chloride SA (KLOR-CON M) CR tablet 20 mEq (has no administration in time range)  clobetasol cream (TEMOVATE) 0.05 % 1 Application (has no administration in time range)  diclofenac Sodium (VOLTAREN) 1 % topical gel 2 g (has no administration in time range)  enoxaparin (LOVENOX) injection 80 mg (has no administration in time range)  Muscle Rub CREA (has no administration in time range)  diltiazem (CARDIZEM) 1 mg/mL load via infusion 20 mg (20 mg Intravenous Bolus from Bag 06/07/22 0403)  LORazepam  (ATIVAN) injection 1 mg (1 mg Intravenous Given 06/07/22 0621)  magnesium sulfate IVPB 2 g 50 mL (0 g Intravenous Stopped 06/07/22 0914)  potassium chloride SA (KLOR-CON M) CR tablet 60 mEq (60 mEq Oral Given 06/07/22 0912)  heparin bolus via infusion 4,000 Units (4,000 Units Intravenous Bolus from Bag 06/07/22 0957)  furosemide (LASIX) injection 40 mg (40 mg Intravenous Given 06/07/22 1103)    Mobility walks Low fall risk   Focused Assessments    R Recommendations: See Admitting Provider Note  Report given to:   Additional Notes:

## 2022-06-07 NOTE — H&P (Addendum)
History and Physical    Patient: Scott Waller TKZ:[id number] DOB: 1981/03/13 DOA: 06/07/2022 DOS: the patient was seen and examined on 06/07/2022 PCP: April Manson, NP  Patient coming from: Behavioral health via EMS  Chief Complaint:  Chief Complaint  Patient presents with   Palpitations   HPI: Scott Waller is a 41 y.o. male with medical history significant of hypertension, hyperlipidemia, paroxysmal atrial fibrillation, CAD, anxiety, depression, polysubstance abuse (alcohol, tobacco, opiate, marijuana ), morbid obesity, and GERD who presented for complaints of palpitations.  Patient notes that him and his wife had decided to stop drinking alcohol and get off pain meds.  Normally patient drinks 1/5 of liquor per day, but on the weekends can drink up to half gallon.  He also reports that he was taking 4 oxycodone per day and had recently ran out.  He had initially went to [facility] 2 days ago in efforts to get off oxycodone, but states he was just given oxycodone.  Then yesterday they had went to behavioral health and stated that he plan to blow his head off with a gun at his home earlier in the day for which he was involuntarily committed.  However, patient currently states that he did this just so he would be admitted to receive the help that he needs to get off alcohol and opiates.  His last drink was yesterday.  Initial labs were done which noted hypokalemia, elevated liver enzymes, and hyperbilirubinemia. Urine drug screen positive for morphine, oxycodone, marijuana, and oxazepam.  Influenza and COVID-19 was negative.  [place] police was in the process of transporting patient from the urgent care to the behavioral health hospital when patient reported that he felt his heart fluttering.  Patient notes that he had similar symptoms approximately 12 years ago for which he was on Cardizem for some period in time prior to being switched to a different medicine.  Records from 06/2011 note episode of  atrial fibrillation 05/2011, but no formal EKG is available to confirm.  You does complain of having some left-sided chest cramping that occur when symptoms started and has had some nausea with dry heaves.  Denies any recent fevers, cough, vomiting, diarrhea, or dysuria symptoms.  In route with EMS patient was noted to be in A-fib with heart rates into the 130-170s.  Given 500 mL of normal saline with improvement of heart rates to 110-130.  On admission to the emergency department patient was noted to be afebrile, respirations 15-24, blood pressure elevated up to 180/104, rates 80-132, and O2 saturations maintained on room air.  Labs significant for MCV 102.9, MCH 34.2, platelets 100, potassium 3.3, anion gap 18, AST 109, ALT 54, total bilirubin  2.8.  Chest ray was otherwise noted to be stable.  Patient was given magnesium sulfate 2 g IV, bolus of Cardizem 20 mg IV prior to being started on a Cardizem drip, and started on CIWA protocols with Ativan 1 mg IV given.      Review of Systems: As mentioned in the history of present illness. All other systems reviewed and are negative. Past Medical History:  Diagnosis Date   AKI (acute kidney injury) (HCC)    Anxiety and depression    Arthritis    Asthma    Bronchitis    CAD (coronary artery disease)    a. 10/15/2018 DES to prox LAD   GERD (gastroesophageal reflux disease)    History of atrial fibrillation    During alcohol binge   Hyperlipidemia with target  LDL less than 70    Hypertension    Tobacco abuse    Past Surgical History:  Procedure Laterality Date   APPENDECTOMY     CORONARY STENT INTERVENTION N/A 10/15/2018   Procedure: CORONARY STENT INTERVENTION;  Surgeon: Jettie Booze, MD;  Location: [facility];  Service: Cardiovascular;  Laterality: N/A;   INTRAVASCULAR ULTRASOUND/IVUS N/A 10/15/2018   Procedure: Intravascular Ultrasound/IVUS;  Surgeon: Jettie Booze, MD;  Location: [place] [facility];  Service:  Cardiovascular;  Laterality: N/A;   LEFT HEART CATH AND CORONARY ANGIOGRAPHY N/A 10/15/2018   Procedure: LEFT HEART CATH AND CORONARY ANGIOGRAPHY;  Surgeon: Jettie Booze, MD;  Location: [facility];  Service: Cardiovascular;  Laterality: N/A;   Social History:  reports that he has been smoking cigarettes. He has a 25.00 pack-year smoking history. He has never used smokeless tobacco. He reports current alcohol use of about 20.0 - 24.0 standard drinks of alcohol per week. He reports that he does not use drugs.  Allergies  Allergen Reactions   Statins Other (See Comments)    "Messes with legs"    Family History  Problem Relation Age of Onset   Hypertension Father     Prior to Admission medications   Medication Sig Start Date End Date Taking? Authorizing Provider  acetaminophen (TYLENOL) 500 MG tablet Take 1,000 mg by mouth every 4 (four) hours as needed (For pain).    [provider]  albuterol (PROVENTIL) (2.5 MG/3ML) 0.083% nebulizer solution Take 3 mLs (2.5 mg total) by nebulization every 4 (four) hours as needed for wheezing or shortness of breath. 09/28/17   Ripley Fraise, MD  albuterol (VENTOLIN HFA) 108 (90 Base) MCG/ACT inhaler Inhale 2 puffs into the lungs every 6 (six) hours as needed for wheezing or shortness of breath. 05/18/22   [provider]  ALPRAZolam Duanne Moron) 1 MG tablet Take 1 mg by mouth 3 (three) times daily as needed for anxiety. 05/29/22   [provider]  clobetasol cream (TEMOVATE) [id number] % Apply 1 Application topically 2 (two) times daily as needed (For psoriasis).    [provider]  Clobetasol Propionate (TEMOVATE) 0.05 % external spray Apply 1 application  topically 2 (two) times daily as needed (For psoriasis). 08/12/18   [provider]  COSENTYX SENSOREADY, 300 MG, 150 MG/ML SOAJ Inject 300 mg into the skin every 30 (thirty) days. 07/29/18   [provider]  diclofenac Sodium (VOLTAREN) 1 % GEL  Apply 2 g topically 4 (four) times daily as needed (For arthritis pain on hands).    [provider]  docusate sodium (STOOL SOFTENER) 100 MG capsule Take 100 mg by mouth 2 (two) times daily.    [provider]  ibuprofen (ADVIL) 200 MG tablet Take 800 mg by mouth every 4 (four) hours as needed (For pain).    [provider]  lamoTRIgine (LAMICTAL) 200 MG tablet Take 200 mg by mouth 2 (two) times daily. 04/03/22   [provider]  magnesium hydroxide (MILK OF MAGNESIA) 400 MG/5ML suspension Take 30 mLs by mouth daily as needed for mild constipation.    [provider]  Menthol, Topical Analgesic, (ICY HOT) 7.5 % (Roll) MISC Apply 1 each topically daily as needed (For arthritis pain on hands).    [provider]  metoprolol succinate (TOPROL-XL) 50 MG 24 hr tablet Take 50 mg by mouth daily. Take with or immediately following a meal.    [provider]  Multiple  Vitamin (MULTIVITAMIN WITH MINERALS) TABS tablet Take 1 tablet by mouth daily.    [provider]  nitroGLYCERIN (NITROSTAT) 0.4 MG SL tablet Place 0.4 mg under the tongue every 5 (five) minutes x 3 doses as needed. If no improvement, call 911 08/08/18   [provider]  ondansetron (ZOFRAN-ODT) 8 MG disintegrating tablet Take 1 tablet (8 mg total) by mouth every 8 (eight) hours as needed for nausea or vomiting. 08/14/18   [name], [name], PA-C  Oxycodone HCl 10 MG TABS Take 10 mg by mouth in the morning, at noon, in the evening, and at bedtime. 04/17/22   [provider]  pantoprazole (PROTONIX) 40 MG tablet Take 40 mg by mouth at bedtime.    [provider]  potassium chloride SA (KLOR-CON M) 20 MEQ tablet Take 20 mEq by mouth daily. 05/04/22   [provider]  torsemide (DEMADEX) 20 MG tablet Take 40 mg by mouth 2 (two) times daily. 05/12/22   [provider]  traZODone (DESYREL) 150 MG tablet Take 150 mg by mouth at bedtime.    [provider]    Physical Exam: Vitals:   06/07/22 0600 06/07/22 0601 06/07/22 0620 06/07/22 0700  BP: (!) 165/92  (!) 171/88 (!) 140/89  Pulse: (!) 120 (!) 113 (!) 110 91  Resp: 18 16  (!) 21  Temp:      TempSrc:      SpO2: 95% 95%  93%  Weight:      Height:       Constitutional: Middle-aged morbidly obese male who appears to be in some discomfort. Eyes: PERRL, lids and conjunctivae normal ENMT: Mucous membranes are moist.    Neck: normal, supple  Respiratory: clear to auscultation bilaterally, no wheezing, no crackles. Normal respiratory effort. No accessory muscle use.  Cardiovascular: Irregular and tachycardic. Trace lower extremity edema bilaterally. 2+ pedal pulses. No carotid bruits.  Abdomen: no tenderness, no masses palpated. No hepatosplenomegaly. Bowel sounds positive.  Musculoskeletal: no clubbing / cyanosis. No joint deformity upper and lower extremities. Good ROM, no contractures. Normal muscle tone.  Skin: Lacerations of the bilateral shins.  No significant erythema appreciated at this time Neurologic: CN 2-12 grossly intact.  Tremor present. Psychiatric: Normal judgment and insight. Alert and oriented x 3.  Anxious mood.   Data Reviewed:  EKG shows atrial fibrillation at 120 bpm with paired ventricular complexes Assessment and Plan:  Paroxysmal atrial fibrillation with RVR Patient complained of palpitations while at behavioral health and was found to be in A-fib with RVR with heart rates elevated up into the 170s.  Patient declined cardioversion and was started on a diltiazem drip with bolus.  Heart rates currently improved less than 100.  TSH was noted to be within normal limits at 3.787 on 9/5.  He just recently had echocardiogram 04/2022 which noted EF of 60-65% with normal diastolic parameters. CHA2DS2-VASc score = 2.  Patient has prior history of atrial fibrillation from what records note 05/2011. -Admit to a progressive bed -Continue Cardizem drip -Heparin per  pharmacy -Goal to maintain potassium at least 4 and magnesium in last 2 -Cardiology consulted, we will follow-up for any further recommendations  Alcohol abuse with withdrawal without complication Patient had initially presented to behavioral health due to alcohol withdrawal. -CIWA protocols with Ativan scheduled and as needed -Thiamine, folate, MVI -Transitions of care consulted for alcohol and substance abuse.  Suicidal ideation involuntary commitment Patient was documented to have said that he wanted to blow his  brains out with a gun that he has at home.  He now reports that he just said that he wanted to harm himself to get admitted into the behavioral health hospital at the time.  He had been involuntary committed. -Will continue involuntary commitment at this time until can be reevaluated by psych formally  Hypokalemia Acute.  Potassium 3.3 on admission. -Give potassium chloride 60 mEq p.o. x1 dose -Continue to monitor and replace as needed  Essential hypertension Blood pressures initially elevated up to 180/104.  -Patient currently on diltiazem blood cultures with drip, will currently defer to cardiology for any change in medication management.  Macrocytosis without anemia Patient with hemoglobin 14.1 with MCV 102.9 and MCH 34.2.  Given history of alcohol abuse suspect admit B12 and/or folate deficiency. -Check vitamin B12 level -Continue folate replacement  Opioid dependencechronic pain Patient report that he wants to get off of opioids and has been taking oxycodone 10 mg 4 times daily prior to recently running out of the medication.  At this time he notes that he may need to slowly cut back at first as getting off alcohol and the pain medicines all at once may be too much. -Decreased oxycodone 10 mg p.o. twice daily  -As needed medications from the clonidine detox protocol started -Consider starting clonidine per protocol when more stable  CAD chest pain Patient  reported that he had some cramping of the left side of his chest related to his palpitations.  Home medication regimen previously included Crestor and aspirin 21 mg daily. -Continue statin -Aspirin held due to starting anticoagulation  Transaminitis and hyperbilirubinemia Acute on chronic.  AST 109, ALT 54, and total bilirubin 2.8.  AST to ALT ratio is greater than 1.5 to suggest likely related with patient's history of alcohol abuse. -Continue to monitor  Thrombocytopenia Acute on chronic.  Platelet count 100, but appears chronically low.  Suspect secondary to patient's history of alcohol abuse. -Continue to monitor  Tobacco abuse Patient smokes a pack of cigarettes per day on average. -Nicotine patch offered  Psoriatic arthritis Chronic -Continue outpatient follow-up  General anxiety disorder -Continue Ativan IV as needed for fracture CIWA protocol  Hyperlipidemia Last LDL was 171 -Continue statin  Laceration to the bilateral shins Patient reports that he sustained a lacerations to the bilateral shins after slipping on a dock a while ago. -Continue local wound care  Morbid obesity BMI 45.00 kg/m   DVT prophylaxis: Heparin Advance Care Planning:   Code Status: Full Code  Consults: Cardiology  Family Communication: None  Severity of Illness: The appropriate patient status for this patient is OBSERVATION. Observation status is judged to be reasonable and necessary in order to provide the required intensity of service to ensure the patient's safety. The patient's presenting symptoms, physical exam findings, and initial radiographic and laboratory data in the context of their medical condition is felt to place them at decreased risk for further clinical deterioration. Furthermore, it is anticipated that the patient will be medically stable for discharge from the hospital within 2 midnights of admission.   Author: Clydie Braun, MD 06/07/2022 7:04 AM  For on call review  [email or web address].

## 2022-06-07 NOTE — ED Notes (Signed)
Pt ambulated to restroom with a steady gait

## 2022-06-07 NOTE — Progress Notes (Signed)
Per recs by cardiology discontinued Cardizem drip and patient was switched to oral Cardizem.  Heparin drip discontinued as patient not thought to be a good candidate for oral anticoagulation due to alcohol abuse.  Plans is to reconsider anticoagulation once alcohol detox is complete.  Will monitor overnight to ensure withdrawal symptoms are controlled prior to  having reassessed by psych.

## 2022-06-07 NOTE — ED Provider Notes (Signed)
Patient was scheduled to be admitted to [facility] as a transfer from [facility], however when patient got so the facility and exits the transportation car ([employer] escort).  Patient complained of chest pain stating that he thinks he is going into A-fib, vital signs was immediately checked.  EMS was also immediately called to transport patient to [facility].  Patient was then transferred escorted by [facility] along with EMS to [facility].

## 2022-06-07 NOTE — Progress Notes (Signed)
Police arrived with the patient.  While in [place], pt began to c/o complain of dizziness and "I feel like I am going into A-fib."  Notified AC, Fransico Michael and NP, Sindy Guadeloupe.  V/S obtained and pulse was 177 and BP 136/113.  O2 97% Resp 22.  AC called 911.  Pt then started complaining of chest pain.  Nitroglycerin ordered per NP but was not administered due to ambulance arriving.  Paramedics wanted to assess him before administering Nitro.

## 2022-06-07 NOTE — Consult Note (Addendum)
Cardiology Consultation   Patient ID: Scott Waller MRN: [id number]; DOB: Nov 18, 1980  Admit date: 06/07/2022 Date of Consult: 06/07/2022  PCP:  Jettie Booze, NP   [facility] [facility] Cardiologist:  Larae Grooms, MD   Patient Profile:   Scott Waller is a 41 y.o. male with a hx of remote single episode of Afib (2011), current smoker, anxiety, alcohol and opioid abuse, suicidal ideation with IVC, and obesity who is being seen 06/07/2022 for the evaluation of afib RVR at the request of Dr. Tamala Julian.  History of Present Illness:   Mr. Seck had a single episode of Afib in 2011 related to alcohol binge drinking. Maintained on cardizem. Negative stress test 2016. Heart cath in 10/2018 for chest pain showed 95% proximal LAD successfully stented. He was treated with ASA and plavix (SOB with brilinta). He was hospitalized July 2023 with dehydration, hypotension, and AKI. ARB and HCTZ were stopped. Echo was repeated in July 2023 that showed normal Biventricular function and no significant valvular disease.  He was recently seen by Dr. Irish Lack following hospitalization 05/12/22 and was doing better. ASA was stopped, plavix was continued. He is statin intolerant and was referred to lipid clinic.   He continues to drink alcohol excessively daily and taking opioid pain medication daily for chronic pain. [place]. He presented to [facility]/05/22 with desire to get off of pain medication but did not feel he received adequate treatment. He was discharged with resources for outpatient detox. Yesterday, his wife brought him to behavioral health for treatment. His wife told them he was suicidal in order to get him seen and admitted faster. He denies suicidal ideation to me. On the way to admission, he reported palpitations and was brought to [facility]. He was found to have converted to Afib RVR. He was started on cardizem gtt. He was admitted and cardiology  consulted.   During my exam, he converted to sinus rhythm at 0842. He now feels no palpitations. He is receiving 10 mg/hr cardizem drip. He reports being on cardizem in the past but was switched to a BB. He denies chest pain and shorthess of breath. He has lacerations on both shins from slipping on the dock at the [place] last weekend.    Past Medical History:  Diagnosis Date   AKI (acute kidney injury) ([place])    Anxiety and depression    Arthritis    Asthma    Bronchitis    CAD (coronary artery disease)    a. 10/15/2018 DES to prox LAD   GERD (gastroesophageal reflux disease)    History of atrial fibrillation    During alcohol binge   Hyperlipidemia with target LDL less than 70    Hypertension    Tobacco abuse     Past Surgical History:  Procedure Laterality Date   APPENDECTOMY     CORONARY STENT INTERVENTION N/A 10/15/2018   Procedure: CORONARY STENT INTERVENTION;  Surgeon: Jettie Booze, MD;  Location: [place] [facility];  Service: Cardiovascular;  Laterality: N/A;   INTRAVASCULAR ULTRASOUND/IVUS N/A 10/15/2018   Procedure: Intravascular Ultrasound/IVUS;  Surgeon: Jettie Booze, MD;  Location: [place] [facility];  Service: Cardiovascular;  Laterality: N/A;   LEFT HEART CATH AND CORONARY ANGIOGRAPHY N/A 10/15/2018   Procedure: LEFT HEART CATH AND CORONARY ANGIOGRAPHY;  Surgeon: Jettie Booze, MD;  Location: [place] [facility];  Service: Cardiovascular;  Laterality: N/A;     Home  Medications:  Prior to Admission medications   Medication Sig Start Date End Date Taking? Authorizing Provider  acetaminophen (TYLENOL) 500 MG tablet Take 1,000 mg by mouth every 4 (four) hours as needed (For pain).    [provider]  albuterol (PROVENTIL) (2.5 MG/3ML) 0.083% nebulizer solution Take 3 mLs (2.5 mg total) by nebulization every 4 (four) hours as needed for wheezing or shortness of breath. 09/28/17   [name], MD  albuterol (VENTOLIN HFA) 108 (90 Base)  MCG/ACT inhaler Inhale 2 puffs into the lungs every 6 (six) hours as needed for wheezing or shortness of breath. 05/18/22   [provider]  ALPRAZolam Prudy Feeler) 1 MG tablet Take 1 mg by mouth 3 (three) times daily as needed for anxiety. 05/29/22   [provider]  clobetasol cream (TEMOVATE) 0.05 % Apply 1 Application topically 2 (two) times daily as needed (For psoriasis).    [provider]  Clobetasol Propionate (TEMOVATE) 0.05 % external spray Apply 1 application  topically 2 (two) times daily as needed (For psoriasis). 08/12/18   [provider]  COSENTYX SENSOREADY, 300 MG, 150 MG/ML SOAJ Inject 300 mg into the skin every 30 (thirty) days. 07/29/18   [provider]  diclofenac Sodium (VOLTAREN) 1 % GEL Apply 2 g topically 4 (four) times daily as needed (For arthritis pain on hands).    [provider]  docusate sodium (STOOL SOFTENER) 100 MG capsule Take 100 mg by mouth 2 (two) times daily.    [provider]  ibuprofen (ADVIL) 200 MG tablet Take 800 mg by mouth every 4 (four) hours as needed (For pain).    [provider]  lamoTRIgine (LAMICTAL) 200 MG tablet Take 200 mg by mouth 2 (two) times daily. 04/03/22   [provider]  magnesium hydroxide (MILK OF MAGNESIA) 400 MG/5ML suspension Take 30 mLs by mouth daily as needed for mild constipation.    [provider]  Menthol, Topical Analgesic, (ICY HOT) 7.5 % (Roll) MISC Apply 1 each topically daily as needed (For arthritis pain on hands).    [provider]  metoprolol succinate (TOPROL-XL) 50 MG 24 hr tablet Take 50 mg by mouth daily. Take with or immediately following a meal.    [provider]  Multiple Vitamin (MULTIVITAMIN WITH MINERALS) TABS tablet Take 1 tablet by mouth daily.    [provider]  nitroGLYCERIN (NITROSTAT) 0.4 MG SL tablet Place 0.4 mg under the tongue every 5 (five) minutes x 3 doses as needed. If no  improvement, call 911 08/08/18   [provider]  ondansetron (ZOFRAN-ODT) 8 MG disintegrating tablet Take 1 tablet (8 mg total) by mouth every 8 (eight) hours as needed for nausea or vomiting. 08/14/18   [name], [name], PA-C  Oxycodone HCl 10 MG TABS Take 10 mg by mouth in the morning, at noon, in the evening, and at bedtime. 04/17/22   [provider]  pantoprazole (PROTONIX) 40 MG tablet Take 40 mg by mouth at bedtime.    [provider]  potassium chloride SA (KLOR-CON M) 20 MEQ tablet Take 20 mEq by mouth daily. 05/04/22   [provider]  torsemide (DEMADEX) 20 MG tablet Take 40 mg by mouth 2 (two) times daily. 05/12/22   [provider]  traZODone (DESYREL) 150 MG tablet Take 150 mg by mouth at bedtime.    [provider]    Inpatient Medications: Scheduled Meds:  folic acid  1 mg Oral Daily  heparin  4,000 Units Intravenous Once   LORazepam  0-4 mg Intravenous Q6H   Followed by   Derrill Memo ON 06/09/2022] LORazepam  0-4 mg Intravenous Q12H   multivitamin with minerals  1 tablet Oral Daily   nicotine  21 mg Transdermal Daily   thiamine  100 mg Oral Daily   Or   thiamine  100 mg Intravenous Daily   Continuous Infusions:  diltiazem (CARDIZEM) infusion 10 mg/hr (06/07/22 0550)   heparin 1,700 Units/hr (06/07/22 0957)   PRN Meds: acetaminophen **OR** acetaminophen, albuterol, dicyclomine, hydrOXYzine, loperamide, LORazepam **OR** LORazepam, methocarbamol, naproxen, ondansetron **OR** ondansetron (ZOFRAN) IV, oxyCODONE  Allergies:    Allergies  Allergen Reactions   Statins Other (See Comments)    "Messes with legs"    Social History:   Social History   Socioeconomic History   Marital status: Married    Spouse name: Not on file   Number of children: Not on file   Years of education: Not on file   Highest education level: Not on file  Occupational History   Not on file  Tobacco Use   Smoking status: Every Day    Packs/day:  1.00    Years: 25.00    Total pack years: 25.00    Types: Cigarettes   Smokeless tobacco: Never  Vaping Use   Vaping Use: Never used  Substance and Sexual Activity   Alcohol use: Yes    Alcohol/week: 20.0 - 24.0 standard drinks of alcohol    Types: 20 - 24 Cans of beer per week   Drug use: No   Sexual activity: Yes    Partners: Female  Other Topics Concern   Not on file  Social History Narrative   Not on file   Social Determinants of Health   Financial Resource Strain: Not on file  Food Insecurity: Not on file  Transportation Needs: Not on file  Physical Activity: Not on file  Stress: Not on file  Social Connections: Not on file  Intimate Partner Violence: Not on file    Family History:    Family History  Problem Relation Age of Onset   Hypertension Father      ROS:  Please see the history of present illness.   All other ROS reviewed and negative.     Physical Exam/Data:   Vitals:   06/07/22 0620 06/07/22 0700 06/07/22 0740 06/07/22 0744  BP: (!) 171/88 (!) 140/89 139/86 139/86  Pulse: (!) 110 91 (!) 109 (!) 103  Resp:  (!) 21 13   Temp:   98.1 F (36.7 C)   TempSrc:   Oral   SpO2:  93% 96%   Weight:      Height:       No intake or output data in the 24 hours ending 06/07/22 0957    06/07/2022    3:33 AM 05/12/2022    2:24 PM 04/22/2022    5:00 AM  Last 3 Weights  Weight (lbs) 360 lb 378 lb 3.2 oz 361 lb 8.9 oz  Weight (kg) 163.295 kg 171.55 kg 164 kg     Body mass index is 45 kg/m.  General: obese male in NAD HEENT: normal Neck: no JVD Vascular: No carotid bruits; Distal pulses 2+ bilaterally Cardiac:  normal S1, S2; RRR; no murmur  Lungs:  clear to auscultation bilaterally, no wheezing, rhonchi or rales  Abd: soft, nontender, no hepatomegaly  Ext: no edema Musculoskeletal:  No deformities, BUE and BLE strength normal and equal Skin: warm and  dry  Neuro:  CNs 2-12 intact, no focal abnormalities noted Psych:  Normal affect   EKG:  The EKG  was personally reviewed and demonstrates:  Afib with VR 120 Telemetry:  Telemetry was personally reviewed and demonstrates:  Afib with VR 100-120s, now sinus rhythm in the 80s (~3818)  Relevant CV Studies:  Echo 04/21/22: 1. Left ventricular ejection fraction, by estimation, is 60 to 65%. The  left ventricle has normal function. The left ventricle has no regional  wall motion abnormalities. Left ventricular diastolic parameters were  normal.   2. Right ventricular systolic function is normal. The right ventricular  size is normal.   3. The mitral valve is normal in structure. No evidence of mitral valve  regurgitation. No evidence of mitral stenosis.   4. The aortic valve was not well visualized. There is mild calcification  of the aortic valve. Aortic valve regurgitation is not visualized. Aortic  valve sclerosis is present, with no evidence of aortic valve stenosis.   5. The inferior vena cava is normal in size with greater than 50%  respiratory variability, suggesting right atrial pressure of 3 mmHg.   Laboratory Data:  High Sensitivity Troponin:   Recent Labs  Lab 06/07/22 0424  TROPONINIHS 35*     Chemistry Recent Labs  Lab 06/05/22 1635 06/06/22 1620 06/07/22 0424  NA 136 135 137  K 3.6 3.4* 3.3*  CL 93* 91* 95*  CO2 26 25 24   GLUCOSE 80 74 90  BUN 10 5* <5*  CREATININE 0.97 0.96 1.05  CALCIUM 9.2 9.3 9.4  MG  --  2.0 1.8  GFRNONAA >60 >60 >60  ANIONGAP 17* 19* 18*    Recent Labs  Lab 06/05/22 1635 06/06/22 1620 06/07/22 0424  PROT 7.4 7.5 7.3  ALBUMIN 3.5 3.5 3.4*  AST 99* 106* 109*  ALT 51* 54* 54*  ALKPHOS 86 89 90  BILITOT 2.5* 2.6* 2.8*   Lipids  Recent Labs  Lab 06/06/22 1620  CHOL 290*  TRIG 75  HDL 98  LDLCALC 177*  CHOLHDL 3.0    Hematology Recent Labs  Lab 06/05/22 1635 06/06/22 1620 06/07/22 0424  WBC 4.5 4.9 4.1  RBC 3.96* 4.05* 4.12*  HGB 13.6 14.1 14.1  HCT 40.4 40.3 42.4  MCV 102.0* 99.5 102.9*  MCH 34.3* 34.8* 34.2*   MCHC 33.7 35.0 33.3  RDW 16.0* 15.9* 15.9*  PLT 125* 123* 100*   Thyroid  Recent Labs  Lab 06/06/22 1620  TSH 3.787    BNPNo results for input(s): "BNP", "PROBNP" in the last 168 hours.  DDimer No results for input(s): "DDIMER" in the last 168 hours.   Radiology/Studies:  DG Chest Portable 1 View  Result Date: 06/07/2022 CLINICAL DATA:  Evaluate for palpitation EXAM: PORTABLE CHEST 1 VIEW COMPARISON:  04/19/2022 FINDINGS: Artifact from EKG leads. Normal heart size and mediastinal contours for portable technique. Mild interstitial prominence which is similar to prior. No acute infiltrate or Kerley line. No effusion or pneumothorax. No acute osseous findings. IMPRESSION: Stable exam.  No evidence of acute disease. Electronically Signed   By: 04/21/2022 M.D.   On: 06/07/2022 04:20   DG Tibia/Fibula Right  Result Date: 06/05/2022 CLINICAL DATA:  injury to both legs EXAM: RIGHT TIBIA AND FIBULA - 2 VIEW COMPARISON:  None Available. FINDINGS: Well-corticated osseous fragments along the inferior aspect of the patellofemoral joint on lateral view may represent an old fracture versus heterotopic calcification. Patellar alignment on frontal view appears laterally subluxed which may  be due to patient positioning. There is no evidence of fracture or other focal bone lesions. Soft tissues are unremarkable. IMPRESSION: 1. No acute displaced fracture or dislocation. 2. Patellar alignment on frontal view appears laterally subluxed which may be due to patient positioning. Recommend sunrise view for further evaluation. Electronically Signed   By: Iven Finn M.D.   On: 06/05/2022 17:24   DG Tibia/Fibula Left  Result Date: 06/05/2022 CLINICAL DATA:  injury to both legs EXAM: LEFT TIBIA AND FIBULA - 2 VIEW COMPARISON:  None Available. FINDINGS: There is no evidence of fracture or other focal bone lesions. Soft tissues are unremarkable. IMPRESSION: Negative. Electronically Signed   By: Iven Finn  M.D.   On: 06/05/2022 17:22     Assessment and Plan:   Atrial fibrillation with RVR - in the setting of alcohol and opiate withdrawal - started on cardizem gtt and heparin gtt - has converted to sinus rhythm - suspect withdrawal from both alcohol and opioids is playing a role along  - recent echo with normal biventricular function and no significant valvular disease - TSH WNL - will convert cardizem gtt to 240 mg PO dosing given normal LVEF - can continued BB with concomitant CAD - follow Mg and K   Need for anticoagulation This patients CHA2DS2-VASc Score and unadjusted Ischemic Stroke Rate (% per year) is equal to 2.2 % stroke rate/year from a score of 2 (HTN, CAD) I discussed potential OAC to protect from stroke. We discussed that Albert can be higher risk in patient's with ongoing alcohol abuse. Not currently an anticoagulation candidate. Will continue plavix for now.    Hypertension Recently discontinued ARB-HCTZ Cardizem started Follow BP   CAD s/p DES-LAD 2020 - if Edgar initiated, will need to consider changing plavix to ASA - no OAC in the acute setting, continue plavix for  now - continue BB   Hyperlipidemia with LDL goal < 70 06/06/2022: Cholesterol 290; HDL 98; LDL Cholesterol 177; Triglycerides 75; VLDL 15 Intolerant to statins Referred to lipid clinic for [id number]   Polysubstance abuse Positive for benzos, morphine, oxycodone, and marijuana Ethanol was 178 Defer to primary CIWA protocol     Risk Assessment/Risk Scores:     CHA2DS2-VASc Score = 2   This indicates a 2.2% annual risk of stroke. The patient's score is based upon: CHF History: 0 HTN History: 1 Diabetes History: 0 Stroke History: 0 Vascular Disease History: 1 Age Score: 0 Gender Score: 0      For questions or updates, please contact [place] Please consult [email or web address] for contact info under    Signed, Ledora Bottcher, PA  06/07/2022 9:57 AM  I have examined the  patient and reviewed assessment and plan and discussed with patient.  Agree with above as stated.    Patient is interested in stopping drinking and stopping using opiates.  He is being considered for admission to behavioral health.  He was noted to be in atrial fibrillation.  He thinks this was just from the stress of having to sit in the emergency room overnight.  He was started on IV diltiazem.  He converted to normal sinus rhythm.  Several questions were asked about anticoagulation and medical stability for rehab.  Currently, the patient does report some shortness of breath.  He has not had his usual morning dose of diuretics.  He had low potassium which was repleted.  Recommendations: 1.  In regards to his atrial fibrillation, he is now back in sinus rhythm.  Convert IV diltiazem to oral diltiazem.  Continue clopidogrel.  He is not a good candidate for oral anticoagulation while he has the issues with alcohol abuse.  His atrial fibrillation has been paroxysmal and short-lived.  We can reconsider anticoagulation once his alcohol detox is complete.  2.  He is stable from medical standpoint for a rehab program to help with his alcohol abuse and opiate use.  3.  Single dose of IV Lasix 40 mg ordered.  After today, hopefully he will be able to resume his usual dose of torsemide.  I suspect he may have a little volume overload after being in atrial fibrillation.  The IV Lasix should help.  Please call with further questions.    Larae Grooms

## 2022-06-07 NOTE — ED Notes (Signed)
Report called to RN Kathlee Nations, [facility] rm [id number].  Pt is IVC.

## 2022-06-07 NOTE — ED Notes (Signed)
Pt in room A&O x4. Updated on plan of care. Pt reports that he is not SI/HI nor having AH/VH. Pt reports that his wife told him to say that so that we would get a full work up. Oxygen in place 2L for comfort while sleeping.

## 2022-06-07 NOTE — ED Notes (Signed)
IVC paperwork [place]

## 2022-06-07 NOTE — ED Notes (Signed)
IVC paperwork verified in [place]; expiration 06/13/2022.

## 2022-06-07 NOTE — ED Provider Notes (Signed)
[facility] EMERGENCY DEPARTMENT Provider Note   CSN: [id number] Arrival date & time: 06/07/22  4742     History  Chief Complaint  Patient presents with   Palpitations    Scott Waller is a 41 y.o. male.  41 year old male with multiple medical problems who presents the ER today with palpitations.  Patient was reportedly suicidal, already involuntarily committed, but more so trying to get off of alcohol and drugs.  He states that the police showed up to pick him up and he felt abnormal palpitations.  Patient states he has a history of A-fib in the distant past and felt like this was the same thing.  When he got to behavioral health the patient had his vitals taken and was found to be in A-fib RVR, EMS was called to bring him here for further evaluation.  Patient states that he recently was admitted to the hospital for renal failure which is resolved.  States he has some lower extremity swelling which is improving.  Has some bilateral lower extremity erythema which is also improving.   Palpitations      Home Medications Prior to Admission medications   Medication Sig Start Date End Date Taking? Authorizing Provider  acetaminophen (TYLENOL) 500 MG tablet Take 1,000 mg by mouth every 4 (four) hours as needed (For pain).    [provider]  albuterol (PROVENTIL) (2.5 MG/3ML) 0.083% nebulizer solution Take 3 mLs (2.5 mg total) by nebulization every 4 (four) hours as needed for wheezing or shortness of breath. 09/28/17   [name], MD  albuterol (VENTOLIN HFA) 108 (90 Base) MCG/ACT inhaler Inhale 2 puffs into the lungs every 6 (six) hours as needed for wheezing or shortness of breath. 05/18/22   [provider]  ALPRAZolam Prudy Feeler) 1 MG tablet Take 1 mg by mouth 3 (three) times daily as needed for anxiety. 05/29/22   [provider]  clobetasol cream (TEMOVATE) 0.05 % Apply 1 Application topically 2 (two) times daily as needed (For psoriasis).     [provider]  Clobetasol Propionate (TEMOVATE) 0.05 % external spray Apply 1 application  topically 2 (two) times daily as needed (For psoriasis). 08/12/18   [provider]  COSENTYX SENSOREADY, 300 MG, 150 MG/ML SOAJ Inject 300 mg into the skin every 30 (thirty) days. 07/29/18   [provider]  diclofenac Sodium (VOLTAREN) 1 % GEL Apply 2 g topically 4 (four) times daily as needed (For arthritis pain on hands).    [provider]  docusate sodium (STOOL SOFTENER) 100 MG capsule Take 100 mg by mouth 2 (two) times daily.    [provider]  ibuprofen (ADVIL) 200 MG tablet Take 800 mg by mouth every 4 (four) hours as needed (For pain).    [provider]  lamoTRIgine (LAMICTAL) 200 MG tablet Take 200 mg by mouth 2 (two) times daily. 04/03/22   [provider]  magnesium hydroxide (MILK OF MAGNESIA) 400 MG/5ML suspension Take 30 mLs by mouth daily as needed for mild constipation.    [provider]  Menthol, Topical Analgesic, (ICY HOT) 7.5 % (Roll) MISC Apply 1 each topically daily as needed (For arthritis pain on hands).    [provider]  metoprolol succinate (TOPROL-XL) 50 MG 24 hr tablet Take 50 mg by mouth daily. Take with or immediately following a meal.    [provider]  Multiple Vitamin (MULTIVITAMIN WITH MINERALS) TABS tablet Take 1 tablet by mouth daily.    [provider]  nitroGLYCERIN (NITROSTAT) 0.4 MG SL tablet Place 0.4 mg under the tongue every 5 (five) minutes x 3 doses as needed. If no improvement, call 911 08/08/18   [provider]  ondansetron (ZOFRAN-ODT) 8 MG disintegrating tablet Take 1 tablet (8 mg total) by mouth every 8 (eight) hours as needed for nausea or vomiting. 08/14/18   [name], [name], PA-C  Oxycodone HCl 10 MG TABS Take 10 mg by mouth in the morning, at noon, in the evening, and at bedtime. 04/17/22   [provider]  pantoprazole (PROTONIX) 40 MG  tablet Take 40 mg by mouth at bedtime.    [provider]  potassium chloride SA (KLOR-CON M) 20 MEQ tablet Take 20 mEq by mouth daily. 05/04/22   [provider]  torsemide (DEMADEX) 20 MG tablet Take 40 mg by mouth 2 (two) times daily. 05/12/22   [provider]  traZODone (DESYREL) 150 MG tablet Take 150 mg by mouth at bedtime.    [provider]      Allergies    Statins    Review of Systems   Review of Systems  Cardiovascular:  Positive for palpitations.    Physical Exam Updated Vital Signs BP (!) 140/89   Pulse 91   Temp 97.9 F (36.6 C) (Oral)   Resp (!) 21   Ht 6\' 3"  (1.905 m)   Wt (!) 163.3 kg   SpO2 93%   BMI 45.00 kg/m  Physical Exam Vitals and nursing note reviewed.  Constitutional:      Appearance: He is well-developed.  HENT:     Head: Normocephalic and atraumatic.  Eyes:     Pupils: Pupils are equal, round, and reactive to light.  Cardiovascular:     Rate and Rhythm: Tachycardia present. Rhythm irregular.  Pulmonary:     Effort: Pulmonary effort is normal. No respiratory distress.  Abdominal:     General: Abdomen is flat. There is no distension.  Musculoskeletal:        General: Normal range of motion.     Cervical back: Normal range of motion.     Right lower leg: Edema present.     Left lower leg: Edema present.  Skin:    Findings: Erythema (BLE) present.  Neurological:     Mental Status: He is alert.     ED Results / Procedures / Treatments   Labs (all labs ordered are listed, but only abnormal results are displayed) Labs Reviewed  CBC WITH DIFFERENTIAL/PLATELET - Abnormal; Notable for the following components:      Result Value   RBC 4.12 (*)    MCV 102.9 (*)    MCH 34.2 (*)    RDW 15.9 (*)    Platelets 100 (*)    All other components within normal limits  COMPREHENSIVE METABOLIC PANEL - Abnormal; Notable for the following components:   Potassium 3.3 (*)    Chloride 95 (*)    BUN <5 (*)    Albumin  3.4 (*)    AST 109 (*)    ALT 54 (*)    Total Bilirubin 2.8 (*)    Anion gap 18 (*)    All other components within normal limits  MAGNESIUM    EKG None  Radiology DG Chest Portable 1 View  Result Date: 06/07/2022 CLINICAL DATA:  Evaluate for palpitation EXAM: PORTABLE CHEST 1 VIEW COMPARISON:  04/19/2022 FINDINGS: Artifact from EKG leads. Normal heart size and mediastinal contours for portable technique. Mild interstitial  prominence which is similar to prior. No acute infiltrate or Kerley line. No effusion or pneumothorax. No acute osseous findings. IMPRESSION: Stable exam.  No evidence of acute disease. Electronically Signed   By: Tiburcio Pea M.D.   On: 06/07/2022 04:20   DG Tibia/Fibula Right  Result Date: 06/05/2022 CLINICAL DATA:  injury to both legs EXAM: RIGHT TIBIA AND FIBULA - 2 VIEW COMPARISON:  None Available. FINDINGS: Well-corticated osseous fragments along the inferior aspect of the patellofemoral joint on lateral view may represent an old fracture versus heterotopic calcification. Patellar alignment on frontal view appears laterally subluxed which may be due to patient positioning. There is no evidence of fracture or other focal bone lesions. Soft tissues are unremarkable. IMPRESSION: 1. No acute displaced fracture or dislocation. 2. Patellar alignment on frontal view appears laterally subluxed which may be due to patient positioning. Recommend sunrise view for further evaluation. Electronically Signed   By: Tish Frederickson M.D.   On: 06/05/2022 17:24   DG Tibia/Fibula Left  Result Date: 06/05/2022 CLINICAL DATA:  injury to both legs EXAM: LEFT TIBIA AND FIBULA - 2 VIEW COMPARISON:  None Available. FINDINGS: There is no evidence of fracture or other focal bone lesions. Soft tissues are unremarkable. IMPRESSION: Negative. Electronically Signed   By: Tish Frederickson M.D.   On: 06/05/2022 17:22    Procedures .Critical Care  Performed by: Marily Memos, MD Authorized by:  Marily Memos, MD   Critical care provider statement:    Critical care time (minutes):  30   Critical care was necessary to treat or prevent imminent or life-threatening deterioration of the following conditions:  Circulatory failure   Critical care was time spent personally by me on the following activities:  Development of treatment plan with patient or surrogate, discussions with consultants, evaluation of patient's response to treatment, examination of patient, ordering and review of laboratory studies, ordering and review of radiographic studies, ordering and performing treatments and interventions, pulse oximetry, re-evaluation of patient's condition and review of old charts   Medications Ordered in ED Medications  diltiazem (CARDIZEM) 1 mg/mL load via infusion 20 mg (20 mg Intravenous Bolus from Bag 06/07/22 0403)    And  diltiazem (CARDIZEM) 125 mg in dextrose 5% 125 mL (1 mg/mL) infusion (10 mg/hr Intravenous Rate/Dose Change 06/07/22 0550)  potassium chloride 10 mEq in 100 mL IVPB (has no administration in time range)  magnesium sulfate IVPB 2 g 50 mL (has no administration in time range)  LORazepam (ATIVAN) tablet 1-4 mg (has no administration in time range)    Or  LORazepam (ATIVAN) injection 1-4 mg (has no administration in time range)  thiamine (VITAMIN B1) tablet 100 mg (has no administration in time range)    Or  thiamine (VITAMIN B1) injection 100 mg (has no administration in time range)  folic acid (FOLVITE) tablet 1 mg (has no administration in time range)  multivitamin with minerals tablet 1 tablet (has no administration in time range)  LORazepam (ATIVAN) injection 1 mg (1 mg Intravenous Given 06/07/22 7846)    ED Course/ Medical Decision Making/ A&P                           Medical Decision Making Amount and/or Complexity of Data Reviewed Labs: ordered. Radiology: ordered.  Risk OTC drugs. Prescription drug management. Decision regarding hospitalization.  Patient  found to be in Afib RVR, likely in last 3-4 hours. Offered cardioversion, patient is hesitant and would rather  get diltiazem as it has helped him in the past. States his cardiologist, Dr. Eldridge Dace, wants him on ASA rather than other anticoagulation 2/2 easy bruising so will not alter that.   CHA2DS2/VAS Stroke Risk Points      N/A >= 2 Points: High Risk  1 - 1.99 Points: Medium Risk  0 Points: Low Risk    Last Change: N/A      This score determines the patient's risk of having a stroke if the  patient has atrial fibrillation.      This score is not applicable to this patient. Components are not  calculated.     CXR done and showed no obvious edema or other issues (independently viewed and interpreted by myself and radiology read reviewed).  AFIB rate improved with Dilt but still above 100 and not likely to stay controlled and thus not able to go to [facility]. I wonder if it is EtOH withdrawal complicating the picture as he is hypertensive as well. Will initiate CIWA with PRN ativan.   Discussed with Dr. [name] for admission.   Final Clinical Impression(s) / ED Diagnoses Final diagnoses:  Atrial fibrillation with RVR [facility])    Rx / DC Orders ED Discharge Orders     None         Delrae Hagey, Barbara Cower, MD 06/07/22 [phone]

## 2022-06-08 DIAGNOSIS — I25118 Atherosclerotic heart disease of native coronary artery with other forms of angina pectoris: Secondary | ICD-10-CM | POA: Diagnosis not present

## 2022-06-08 DIAGNOSIS — I480 Paroxysmal atrial fibrillation: Secondary | ICD-10-CM | POA: Diagnosis present

## 2022-06-08 DIAGNOSIS — F1123 Opioid dependence with withdrawal: Secondary | ICD-10-CM | POA: Diagnosis present

## 2022-06-08 DIAGNOSIS — D7589 Other specified diseases of blood and blood-forming organs: Secondary | ICD-10-CM | POA: Diagnosis present

## 2022-06-08 DIAGNOSIS — R0609 Other forms of dyspnea: Secondary | ICD-10-CM

## 2022-06-08 DIAGNOSIS — I2510 Atherosclerotic heart disease of native coronary artery without angina pectoris: Secondary | ICD-10-CM | POA: Diagnosis present

## 2022-06-08 DIAGNOSIS — F10930 Alcohol use, unspecified with withdrawal, uncomplicated: Secondary | ICD-10-CM | POA: Diagnosis not present

## 2022-06-08 DIAGNOSIS — F17210 Nicotine dependence, cigarettes, uncomplicated: Secondary | ICD-10-CM | POA: Diagnosis present

## 2022-06-08 DIAGNOSIS — E785 Hyperlipidemia, unspecified: Secondary | ICD-10-CM | POA: Diagnosis present

## 2022-06-08 DIAGNOSIS — I4891 Unspecified atrial fibrillation: Secondary | ICD-10-CM | POA: Diagnosis not present

## 2022-06-08 DIAGNOSIS — Z6841 Body Mass Index (BMI) 40.0 and over, adult: Secondary | ICD-10-CM | POA: Diagnosis not present

## 2022-06-08 DIAGNOSIS — S81812A Laceration without foreign body, left lower leg, initial encounter: Secondary | ICD-10-CM | POA: Diagnosis present

## 2022-06-08 DIAGNOSIS — R002 Palpitations: Secondary | ICD-10-CM | POA: Diagnosis present

## 2022-06-08 DIAGNOSIS — E876 Hypokalemia: Secondary | ICD-10-CM | POA: Diagnosis present

## 2022-06-08 DIAGNOSIS — I10 Essential (primary) hypertension: Secondary | ICD-10-CM | POA: Diagnosis present

## 2022-06-08 DIAGNOSIS — F10139 Alcohol abuse with withdrawal, unspecified: Secondary | ICD-10-CM | POA: Diagnosis present

## 2022-06-08 DIAGNOSIS — S81811A Laceration without foreign body, right lower leg, initial encounter: Secondary | ICD-10-CM | POA: Diagnosis present

## 2022-06-08 DIAGNOSIS — F10130 Alcohol abuse with withdrawal, uncomplicated: Secondary | ICD-10-CM | POA: Diagnosis not present

## 2022-06-08 DIAGNOSIS — R45851 Suicidal ideations: Secondary | ICD-10-CM | POA: Diagnosis present

## 2022-06-08 DIAGNOSIS — Z23 Encounter for immunization: Secondary | ICD-10-CM | POA: Diagnosis not present

## 2022-06-08 DIAGNOSIS — Z20822 Contact with and (suspected) exposure to covid-19: Secondary | ICD-10-CM | POA: Diagnosis present

## 2022-06-08 DIAGNOSIS — J45909 Unspecified asthma, uncomplicated: Secondary | ICD-10-CM | POA: Diagnosis present

## 2022-06-08 DIAGNOSIS — L4059 Other psoriatic arthropathy: Secondary | ICD-10-CM | POA: Diagnosis present

## 2022-06-08 DIAGNOSIS — F10939 Alcohol use, unspecified with withdrawal, unspecified: Secondary | ICD-10-CM | POA: Diagnosis present

## 2022-06-08 DIAGNOSIS — W1830XA Fall on same level, unspecified, initial encounter: Secondary | ICD-10-CM | POA: Diagnosis present

## 2022-06-08 DIAGNOSIS — D696 Thrombocytopenia, unspecified: Secondary | ICD-10-CM | POA: Diagnosis present

## 2022-06-08 DIAGNOSIS — F32A Depression, unspecified: Secondary | ICD-10-CM | POA: Diagnosis present

## 2022-06-08 DIAGNOSIS — R17 Unspecified jaundice: Secondary | ICD-10-CM | POA: Diagnosis present

## 2022-06-08 DIAGNOSIS — F1210 Cannabis abuse, uncomplicated: Secondary | ICD-10-CM | POA: Diagnosis present

## 2022-06-08 DIAGNOSIS — F411 Generalized anxiety disorder: Secondary | ICD-10-CM | POA: Diagnosis present

## 2022-06-08 DIAGNOSIS — F10129 Alcohol abuse with intoxication, unspecified: Secondary | ICD-10-CM | POA: Diagnosis present

## 2022-06-08 LAB — BASIC METABOLIC PANEL
Anion gap: 14 (ref 5–15)
BUN: 8 mg/dL (ref 6–20)
CO2: 28 mmol/L (ref 22–32)
Calcium: 9.1 mg/dL (ref 8.9–10.3)
Chloride: 95 mmol/L — ABNORMAL LOW (ref 98–111)
Creatinine, Ser: 1.03 mg/dL (ref 0.61–1.24)
GFR, Estimated: >60 mL/min (ref >60)
Glucose, Bld: 85 mg/dL (ref 70–99)
Potassium: 3.1 mmol/L — ABNORMAL LOW (ref 3.5–5.1)
Sodium: 137 mmol/L (ref 135–145)

## 2022-06-08 LAB — MAGNESIUM: Magnesium: 1.9 mg/dL (ref 1.7–2.4)

## 2022-06-08 LAB — CBC
HCT: 37.6 % — ABNORMAL LOW (ref 39.0–52.0)
Hemoglobin: 12.8 g/dL — ABNORMAL LOW (ref 13.0–17.0)
MCH: 34.7 pg — ABNORMAL HIGH (ref 26.0–34.0)
MCHC: 34 g/dL (ref 30.0–36.0)
MCV: 101.9 fL — ABNORMAL HIGH (ref 80.0–100.0)
Platelets: 102 10*3/uL — ABNORMAL LOW (ref 150–400)
RBC: 3.69 MIL/uL — ABNORMAL LOW (ref 4.22–5.81)
RDW: 15.8 % — ABNORMAL HIGH (ref 11.5–15.5)
WBC: 3.7 10*3/uL — ABNORMAL LOW (ref 4.0–10.5)
nRBC: 0 % (ref 0.0–0.2)

## 2022-06-08 LAB — HEPARIN LEVEL (UNFRACTIONATED): Heparin Unfractionated: <0.1 IU/mL — ABNORMAL LOW (ref 0.30–0.70)

## 2022-06-08 MED ORDER — METOPROLOL SUCCINATE ER 50 MG PO TB24
50.0000 mg | ORAL_TABLET | Freq: Every day | ORAL | Status: DC
  Administered 2022-06-08 – 2022-06-09 (×2): 50 mg via ORAL
  Filled 2022-06-08 (×2): qty 1

## 2022-06-08 MED ORDER — POTASSIUM CHLORIDE CRYS ER 20 MEQ PO TBCR
60.0000 meq | EXTENDED_RELEASE_TABLET | ORAL | Status: AC
  Administered 2022-06-08 (×2): 60 meq via ORAL
  Filled 2022-06-08 (×2): qty 3

## 2022-06-08 NOTE — ED Notes (Signed)
Patient belongings located in ED locker room and primary RN notified of belongings location. Security taking belongings to [employer]

## 2022-06-08 NOTE — Progress Notes (Signed)
Per Psych rounding team, okay for RN to d/c suicide sitter, and they will discontinue IVC paper work.

## 2022-06-08 NOTE — Progress Notes (Signed)
Progress Note   Patient: Scott Waller URK:[id number] DOB: 1981/07/09 DOA: 06/07/2022     0 DOS: the patient was seen and examined on 06/08/2022   Brief hospital course: 41 y.o. male with medical history significant of hypertension, hyperlipidemia, paroxysmal atrial fibrillation, CAD, anxiety, depression, polysubstance abuse (alcohol, tobacco, opiate, marijuana ), morbid obesity, and GERD who presented for complaints of palpitations. Pt admitted to heavy drinking over weekends, but decided to quit etoh and opiates. Pt did mention thoughts of self-harm thus was initially involuntarily committed. Found to be in afib RVR, thus admitted for further work up.  Assessment and Plan: Paroxysmal atrial fibrillation with RVR Patient complained of palpitations while at behavioral health and was found to be in A-fib with RVR with heart rates elevated up into the 170s.  Patient declined cardioversion and was started on a diltiazem drip with bolus.  Heart rates currently improved less than 100.  TSH was noted to be within normal limits at 3.787 on 9/5.  He just recently had echocardiogram 04/2022 which noted EF of 60-65% with normal diastolic parameters. CHA2DS2-VASc score = 2.  Patient has prior history of atrial fibrillation from what records note 05/2011. -Cardiology was consulted and pt was transitioned off cardizem gtt to PO -Per Cardiology, afib is likely result of etoh and other substance detox. Recs to cont cardizem 240mg  with home BB -Not a candidate for anticoagulation per Cardiology. Cont plavix and plan reconsideration of DOAC in outpatient setting -Cardiology has signed off   Alcohol abuse with withdrawal without complication Patient had initially presented to behavioral health due to alcohol withdrawal. -CIWA protocols with Ativan scheduled and as needed -Thiamine, folate, MVI -Transitions of care consulted for alcohol and substance abuse. -CIWA remains elevated, trended up to 12 this afternoon    Suicidal ideation involuntary commitment Patient was documented to have said that he wanted to blow his brains out with a gun that he has at home.  He now reports that he just said that he wanted to harm himself to get admitted into the behavioral health hospital at the time.  He had been involuntary committed. -Psychiatry consulted   Hypokalemia Acute.  Potassium 3.3 on admission. -K down to 3.1, will replace -Recheck bmet in AM   Essential hypertension Blood pressures initially elevated up to 180/104.  -BP improved. Cont current regimen   Macrocytosis without anemia Patient with hemoglobin 14.1 with MCV 102.9 and MCH 34.2.  Given history of alcohol abuse suspect admit B12 and/or folate deficiency. -B12 normal -Continue folate replacement   Opioid dependencechronic pain Patient report that he wants to get off of opioids and has been taking oxycodone 10 mg 4 times daily prior to recently running out of the medication.  At this time he notes that he may need to slowly cut back at first as getting off alcohol and the pain medicines all at once may be too much. -Decreased oxycodone 10 mg p.o. twice daily  -As needed medications from the clonidine detox protocol started -Consider starting clonidine per protocol when more stable   CAD chest pain Patient reported that he had some cramping of the left side of his chest related to his palpitations.  Home medication regimen previously included Crestor and aspirin 21 mg daily. -Continue statin -Aspirin held due to starting anticoagulation   Transaminitis and hyperbilirubinemia Acute on chronic.  AST 109, ALT 54, and total bilirubin 2.8.  AST to ALT ratio is greater than 1.5 to suggest likely related with patient's history of alcohol  abuse. -Continue to monitor   Thrombocytopenia Acute on chronic.  Platelet count 100, but appears chronically low.  Suspect secondary to patient's history of alcohol abuse. -Continue to monitor   Tobacco  abuse Patient smokes a pack of cigarettes per day on average. -Nicotine patch offered   Psoriatic arthritis Chronic -Continue outpatient follow-up   General anxiety disorder -Continue Ativan IV as needed for fracture CIWA protocol   Hyperlipidemia Last LDL was 171 -Continue statin   Laceration to the bilateral shins Patient reports that he sustained a lacerations to the bilateral shins after slipping on a dock a while ago. -Continue local wound care   Morbid obesity BMI 45.00 kg/m         Subjective: Reports feeling better. Eager to go home soon  Physical Exam: Vitals:   06/08/22 0500 06/08/22 0836 06/08/22 1200 06/08/22 1523  BP: (!) 138/47 (!) 143/77 (!) 154/80 137/74  Pulse: 82 77 80 79  Resp: 18 18 20    Temp: 98.5 F (36.9 C) 98.5 F (36.9 C) 98.2 F (36.8 C)   TempSrc: Oral Oral Oral   SpO2: 94% 96% 99%   Weight:      Height:       General exam: Awake, laying in bed, in nad Respiratory system: Normal respiratory effort, no wheezing Cardiovascular system: regular rate, s1, s2 Gastrointestinal system: Soft, nondistended, positive BS Central nervous system: CN2-12 grossly intact, strength intact Extremities: Perfused, no clubbing Skin: Normal skin turgor, no notable skin lesions seen Psychiatry: Mood normal // no visual hallucinations   Data Reviewed:  Labs reviewed: Na 137, K 3.1, Cr 1.03  Family Communication: Pt in room, family at bedside  Disposition: Status is: Inpatient Remains inpatient appropriate because: Severity of illness  Planned Discharge Destination: Home     Author: , MD 06/08/2022 5:39 PM  For on call review [email or web address]/[email or web address]/2022.

## 2022-06-08 NOTE — TOC Initial Note (Signed)
Transition of Care [facility]) - Initial/Assessment Note    Patient Details  Name: Scott Waller MRN: [id number] Date of Birth: 1980-10-26  Transition of Care [facility]) CM/SW Contact:    Bethann Berkshire, [place] Phone Number: 06/08/2022, 2:44 PM  Clinical Narrative:                  CSW met with pt in response to substance use consult. Pt lives at home with his spouse and 2 children (43yo and 68yo) in [place]. Pt reports his drinking escalated due to job stress and stress related with trying to change jobs. Pt planned to start a new job next week that he feels will be less stressful. Pt provides details of incident leading up to current hospital admission. He states that he was significantly intoxicated on labor day while on vacation with his family and saying things like "you all would be better off without me." The next day spouse brought him to [facility]. Pt was in process of being transferred to [facility] for admission when he had Afib symptoms and was instead admitted to [facility] inpatient. Pt indicates that he did not have serious intent to harm himself and that he feels his primary issue is his alcohol use. Spouse reports a similar situation happened in mid July 2023 where pt went to [facility] and received OP resources. Though shortly after [facility] visit in July, pt was admitted to [place] for AKI. Spouse reports his drinking problems had gotten better after admission for AKI but when pt was approaching starting his new job he became more overwhelmed and drinking escalated again. Pt reports he has been drinking up to 1/5th of liquor/day. He denies any other substance use though does say he is prescribed oxycodone for pain and wants to reduce the amount he is taking. Spouse states that pt did have an outpatient therapy appointment scheduled this week with [facility] though that they had to cancel it due to current admission.  Pt has not had any alcohol use tx in the past but is interested in  treatment options. Pt's spouse has been in contact with [facility]. There were concerns about a high copay for admission though it seems that is  less of a concern for pt and spouse as they would like pt to receive treatment. Spouse explains that [facility] said they likely won't have a bed until sometime next week. CSW explained different levels of care for substance use tx including residential, IOP, and outpatient. Provided resource list and discussed different facilities on the list. Pt and spouse would like to pursue residential treatment at [place] post-discharge so pt will likely DC home from hospital (pending psych eval) and follow [facility] with [facility] to complete an initial assessment. CSW discuss AA peer support groups with pt and spouse which they seemed interested in. CSW provided local meeting list and local AA website. Answered pt and spouse's questions which they are appreciative for.    Expected Discharge Plan: Home/Self Care Barriers to Discharge: Continued Medical Work up   Patient Goals and CMS Choice        Expected Discharge Plan and Services Expected Discharge Plan: Home/Self Care       Living arrangements for the past 2 months: [place]  Prior Living Arrangements/Services Living arrangements for the past 2 months: Single Family Home Lives with:: Self, Minor Children, Spouse Patient language and need for interpreter reviewed:: Yes        Need for Family Participation in Patient Care: Yes (Comment) Care giver support system in place?: Yes (comment)   Criminal Activity/Legal Involvement Pertinent to Current Situation/Hospitalization: No - Comment as needed  Activities of Daily Living Home Assistive Devices/Equipment: None ADL Screening (condition at time of admission) Patient's cognitive ability adequate to safely complete daily activities?: Yes Is the patient deaf or have difficulty  hearing?: No Does the patient have difficulty seeing, even when wearing glasses/contacts?: No Does the patient have difficulty concentrating, remembering, or making decisions?: No Patient able to express need for assistance with ADLs?: Yes Does the patient have difficulty dressing or bathing?: No Independently performs ADLs?: Yes (appropriate for developmental age) Does the patient have difficulty walking or climbing stairs?: No Weakness of Legs: None Weakness of Arms/Hands: None  Permission Sought/Granted   Permission granted to share information with : Yes, Verbal Permission Granted  Share Information with NAME: Poteet,Linda (Spouse)   [phone] (Mobile)           Emotional Assessment Appearance:: Appears stated age Attitude/Demeanor/Rapport: Engaged Affect (typically observed): Accepting, Appropriate Orientation: : Oriented to Self, Oriented to Place, Oriented to  Time, Oriented to Situation Alcohol / Substance Use: Alcohol Use Psych Involvement: Yes (comment)  Admission diagnosis:  Atrial fibrillation with rapid ventricular response (HCC) [I48.91] Atrial fibrillation with RVR (HCC) [I48.91] Patient Active Problem List   Diagnosis Date Noted   Atrial fibrillation with rapid ventricular response ([place]) 06/07/2022   Involuntary commitment 06/07/2022   Hypokalemia 06/07/2022   Macrocytosis without anemia 06/07/2022   Opiate dependence ([place]) 06/07/2022   Hyperbilirubinemia 06/07/2022   Hypomagnesemia 04/21/2022   Transaminitis 04/21/2022   AKI (acute kidney injury) ([place])    Acute kidney injury ([place]) 04/20/2022   Alcohol abuse with withdrawal without complication ([place]) 31/51/7616   Hypotension 04/20/2022   Chronic pain 04/20/2022   CAD (coronary artery disease) 11/07/2018   Tobacco abuse 11/07/2018   Hyperlipidemia LDL goal <70 11/07/2018   Morbid obesity with BMI of 45.0-49.9, adult ([place]) 09/30/2017   Back pain 11/24/2016   Essential hypertension 11/24/2016    Herniated lumbar intervertebral disc 07/18/2016   Psoriatic arthritis ([place]) 07/13/2015   Generalized anxiety disorder 08/31/2014   Plaque psoriasis 03/11/2014   PCP:  Jettie Booze, NP Pharmacy:   [place], [place] - [address] [place] [address] Phone: [phone] Fax: [phone]     Social Determinants of Health (SDOH) Interventions    Readmission Risk Interventions     No data to display

## 2022-06-08 NOTE — Consult Note (Signed)
[facility] Psychiatry New Face-to-Face Psychiatric Evaluation   Name: Scott Waller DOB: Sep 23, 1981 MRN: [id number] Service Date: June 08, 2022 LOS:  LOS: 0 days  Reason for Consult: "Suicidal ideation. Initially admitted to [facility] last night in the ED, transferred to medical floor for Afib which has resolved. Pt without SI, does he still need inpatient [facility]?" Referring Provider: Rickey Barbara, MD   Assessment  Scott Waller is a 41 y.o. male admitted medically for 06/07/2022  3:23 AM for AFib. He carries the psychiatric diagnoses of Alcohol use disorder, generalized anxiety disorder, depression, opioid use disorder, and benzodiazepine use disorder and has a past medical history of Afib, HLD, HTN, and CAD.   His current presentation is while sober, and he denies SI, HI, and AVH today, reporting that his SI claims at [facility] were simply attention-seeking. Even upon arrival to the [facility] patient denied SI, which he has maintained since being admitted to the medical floor. His presentation is most consistent with substance-induced mood disorder. He meets criteria for substance abuse commitment based on the fact that he has a significant alcohol use that is now affecting his mood. He does not qualify for an inpatient psychiatric admission today.  Current outpatient psychotropic medications include Lamictal 200 mg BID and  and historically he has had a good response to these medications. He was compliant with medications prior to admission as evidenced by his wife dispensing medication to patient. On initial examination, patient denies SI. He has no passive suicidal thoughts. His wife denies safety concerns about the patient if he were to return home.  Significant safety concerns from [facility] were addressed today. Patient endorses calling [phone] prior to going to [facility] on 9/5 because he was feeling particularly worried about going into acute renal failure again. While on \\that  call, he says he was not advised to  go to [facility]. Patient went to ED 9/4 for help with alcohol detox and was given a few doses of Librium and outpatient substance use rehab resources. He says that because he really needs help with his alcohol use, he said that he was suicidal to [facility] staff in order to get attention. His wife says that there was no mention of SI by the patient over the past couple of weeks. As well, she says he was NOT sober when he made those statements. The couple also clarified that patient did not arrange to send his son with family while contemplating suicide. Rather, son was sent with maternal grandparents on 9/4 because patient needed to go to the hospital to evaluate his legs after a fall on Labor Day. The pair state that patient has no access to guns at home as they are locked in a gun safe and wife is the only one with the key to the safe (at a location outside the home). Wife denies concerns that she has about patient following on suicidal threats. His son, sense of responsibility to the marriage, and forward thinking would be protective factors for this patient.   Patient overall without SI while sober, maintains that he has had no true SI and that the reporting of SI was just a cry for help. Patient currently taking Lamictal; discussed with him following up outpatient to consider adding another agent for depressive symptoms. Patient also recommended to obtain a sleep study outpatient, as some of the sx of depression may be attributed to undiagnosed/untreated OSA.     Diagnoses:  Active Hospital problems: Principal Problem:   Atrial fibrillation with rapid  ventricular response (HCC) Active Problems:   Essential hypertension   Generalized anxiety disorder   CAD (coronary artery disease)   Tobacco abuse   Hyperlipidemia LDL goal <70   Alcohol abuse with withdrawal without complication (HCC)   Morbid obesity with BMI of 45.0-49.9, adult (HCC)   Psoriatic arthritis (HCC)   Transaminitis   Involuntary  commitment   Hypokalemia   Macrocytosis without anemia   Opiate dependence (HCC)   Hyperbilirubinemia   Alcohol withdrawal (HCC)     Plan  ## Safety and Observation Level:  - Based on my clinical evaluation, I estimate the patient to be at minimal risk of self harm in the current setting - At this time, we recommend discontinuing the 1:1 level of observation. This decision is based on my review of the chart including patient's history and current presentation, interview of the patient, mental status examination, and consideration of suicide risk including evaluating suicidal ideation, plan, intent, suicidal or self-harm behaviors, risk factors, and protective factors. This judgment is based on our ability to directly address suicide risk, implement suicide prevention strategies and develop a safety plan while the patient is in the clinical setting. Please contact our team if there is a concern that risk level has changed.   ## Medications:  -- CONTINUE home Lamictal 200 mg BID No further medication changes to be made at this time.   ## Medical Decision Making Capacity:  Formal decision making capacity was not assessed for any particular decision as part of routine psychiatric evaluation.   ## Further Work-up:  -- most recent EKG on 06/07/22 had QtC of 468 -- Pertinent labwork reviewed earlier this admission includes: AST/ALT 99/51 Ethanol 248 UDS pos for opiates, amphetamine   ## Disposition:  -- Patient is able to discharge home with the plan to initiate substance use treatment once medically stable  ## Behavioral / Environmental:  -- Plan to discontinue 1:1 sitter  ##Legal Status Currently under IVC; to be adjusted to substance use commitment  Thank you for this consult request. Recommendations have been communicated to the primary team.  We will sign off at this time.   Lamar Sprinkles, MD   NEW history  Relevant Aspects of Hospital Course:  Admitted on 06/07/2022 for Afib  and medical clearance for [facility].  Patient Report:  See above for details.   ROS:  Depression: reports increased sleep, but denies depressed mood, guilt, poor appetite, poor energy Bipolar: Denies manic symptoms and impulsivity. However, reports that prior antidepressants did not help much or gave "brain zaps" PTSD: Denies trauma, flashbacks, nightmares.  Anxiety: Worries about illness and illness becoming fatal; otherwise denies generalized  worries or concerns.   Collateral information:  Wife, present throughout assessment vehemently denied safety concerns for patient and that he had been suicidal at home PTA. She has all guns locked away and he would not have access to them. She has been diligently working to help patient gain admission to [facility] for CDIOP vs. Residential rehab. She is working closely with hospital LCSW. Additionally, she has patient established in pain management clinic to work on safely tapering home oxycodone to discontinuation. She is willing to schedule outpatient psychiatrist appointment for patient for medication management as she is in agreement that patient needs to safely taper off or switch benzodiazepines and also believes that he would benefit from antidepressant coverage.   Psychiatric History:  Information collected from patient, chart, wife Never had inpatient psychiatric admission.  Treated for anxiety by outpatient provider  Dr. Carmel Sacramento at [facility]. Outpatient medications: alprazolam 1 mg 3 times daily and Lamictal 200 mg twice daily, with which he reports compliance.    Family psych history: mother who has been diagnosed with anxiety and depression as well as patient's maternal grandmother who had history of both anxiety and depression.   Social History:  Lives with wife and 67 year old son Currently employed  Tobacco use: Daily cigarette smoker Alcohol use: Significant; 1/5 of liquor per day for approximately 4 years Drug use: Reports  taking prescribed oxycodone in excess  Family History:  The patient's family history includes Hypertension in his father.  Medical History: Past Medical History:  Diagnosis Date   AKI (acute kidney injury) (HCC)    Anxiety and depression    Arthritis    Asthma    Bronchitis    CAD (coronary artery disease)    a. 10/15/2018 DES to prox LAD   GERD (gastroesophageal reflux disease)    History of atrial fibrillation    During alcohol binge   Hyperlipidemia with target LDL less than 70    Hypertension    Tobacco abuse     Surgical History: Past Surgical History:  Procedure Laterality Date   APPENDECTOMY     CORONARY STENT INTERVENTION N/A 10/15/2018   Procedure: CORONARY STENT INTERVENTION;  Surgeon: Corky Crafts, MD;  Location: [facility] INVASIVE CV LAB;  Service: Cardiovascular;  Laterality: N/A;   INTRAVASCULAR ULTRASOUND/IVUS N/A 10/15/2018   Procedure: Intravascular Ultrasound/IVUS;  Surgeon: Corky Crafts, MD;  Location: [facility] INVASIVE CV LAB;  Service: Cardiovascular;  Laterality: N/A;   LEFT HEART CATH AND CORONARY ANGIOGRAPHY N/A 10/15/2018   Procedure: LEFT HEART CATH AND CORONARY ANGIOGRAPHY;  Surgeon: Corky Crafts, MD;  Location: [facility] INVASIVE CV LAB;  Service: Cardiovascular;  Laterality: N/A;    Medications:   Current Facility-Administered Medications:    acetaminophen (TYLENOL) tablet 650 mg, 650 mg, Oral, Q6H PRN **OR** acetaminophen (TYLENOL) suppository 650 mg, 650 mg, Rectal, Q6H PRN, Katrinka Blazing, Rondell A, MD   albuterol (PROVENTIL) (2.5 MG/3ML) 0.083% nebulizer solution 2.5 mg, 2.5 mg, Nebulization, Q6H PRN, Katrinka Blazing, Rondell A, MD   ALPRAZolam Prudy Feeler) tablet 1 mg, 1 mg, Oral, TID PRN, Madelyn Flavors A, MD, 1 mg at 06/08/22 2127   clobetasol cream (TEMOVATE) 0.05 % 1 Application, 1 Application, Topical, BID PRN, Katrinka Blazing, Rondell A, MD   clopidogrel (PLAVIX) tablet 75 mg, 75 mg, Oral, Daily, Katrinka Blazing, Rondell A, MD, 75 mg at 06/08/22 0940   diclofenac Sodium  (VOLTAREN) 1 % topical gel 2 g, 2 g, Topical, QID PRN, Katrinka Blazing, Rondell A, MD   dicyclomine (BENTYL) tablet 20 mg, 20 mg, Oral, Q6H PRN, Katrinka Blazing, Rondell A, MD   diltiazem (CARDIZEM CD) 24 hr capsule 240 mg, 240 mg, Oral, Daily, Duke, Angela Nicole, PA, 240 mg at 06/08/22 0940   enoxaparin (LOVENOX) injection 80 mg, 80 mg, Subcutaneous, Q24H, Smith, Rondell A, MD, 80 mg at 06/08/22 2127   folic acid (FOLVITE) tablet 1 mg, 1 mg, Oral, Daily, Smith, Rondell A, MD, 1 mg at 06/08/22 0940   hydrOXYzine (ATARAX) tablet 25 mg, 25 mg, Oral, Q6H PRN, Katrinka Blazing, Rondell A, MD, 25 mg at 06/07/22 1546   lamoTRIgine (LAMICTAL) tablet 200 mg, 200 mg, Oral, BID, Smith, Rondell A, MD, 200 mg at 06/08/22 2127   loperamide (IMODIUM) capsule 2-4 mg, 2-4 mg, Oral, PRN, Katrinka Blazing, Rondell A, MD   LORazepam (ATIVAN) injection 0-4 mg, 0-4 mg, Intravenous, Q6H, 1 mg at 06/08/22 2000 **FOLLOWED  BY** [START ON 06/09/2022] LORazepam (ATIVAN) injection 0-4 mg, 0-4 mg, Intravenous, Q12H, Smith, Rondell A, MD   LORazepam (ATIVAN) tablet 1-4 mg, 1-4 mg, Oral, Q1H PRN, 2 mg at 06/07/22 1546 **OR** LORazepam (ATIVAN) injection 1-4 mg, 1-4 mg, Intravenous, Q1H PRN, Katrinka Blazing, Rondell A, MD   magnesium hydroxide (MILK OF MAGNESIA) suspension 30 mL, 30 mL, Oral, Daily PRN, Katrinka Blazing, Rondell A, MD   methocarbamol (ROBAXIN) tablet 500 mg, 500 mg, Oral, Q8H PRN, Katrinka Blazing, Rondell A, MD, 500 mg at 06/07/22 1224   metoprolol succinate (TOPROL-XL) 24 hr tablet 50 mg, 50 mg, Oral, Daily, Perlie Gold, PA-C, 50 mg at 06/08/22 1143   multivitamin with minerals tablet 1 tablet, 1 tablet, Oral, Daily, Madelyn Flavors A, MD, 1 tablet at 06/08/22 0940   Muscle Rub CREA, , Topical, PRN, Katrinka Blazing, Rondell A, MD   naproxen (NAPROSYN) tablet 500 mg, 500 mg, Oral, BID PRN, Katrinka Blazing, Rondell A, MD   nicotine (NICODERM CQ - dosed in mg/24 hours) patch 21 mg, 21 mg, Transdermal, Daily, Smith, Rondell A, MD, 21 mg at 06/08/22 0945   ondansetron (ZOFRAN) tablet 4 mg, 4 mg, Oral, Q6H  PRN **OR** ondansetron (ZOFRAN) injection 4 mg, 4 mg, Intravenous, Q6H PRN, Katrinka Blazing, Rondell A, MD   oxyCODONE (Oxy IR/ROXICODONE) immediate release tablet 10 mg, 10 mg, Oral, BID PRN, Katrinka Blazing, Rondell A, MD, 10 mg at 06/08/22 2130   pantoprazole (PROTONIX) EC tablet 40 mg, 40 mg, Oral, Daily PRN, Katrinka Blazing, Rondell A, MD, 40 mg at 06/08/22 1521   thiamine (VITAMIN B1) tablet 100 mg, 100 mg, Oral, Daily, 100 mg at 06/08/22 0940 **OR** thiamine (VITAMIN B1) injection 100 mg, 100 mg, Intravenous, Daily, Smith, Rondell A, MD   torsemide (DEMADEX) tablet 40 mg, 40 mg, Oral, BID, Smith, Rondell A, MD, 40 mg at 06/08/22 1706   traZODone (DESYREL) tablet 150 mg, 150 mg, Oral, QHS, Smith, Rondell A, MD, 150 mg at 06/08/22 2127  Allergies: Allergies  Allergen Reactions   Statins Other (See Comments)    Myalgias       Objective  Vital signs:  Temp:  [98.2 F (36.8 C)-98.5 F (36.9 C)] 98.2 F (36.8 C) (09/07 1200) Pulse Rate:  [77-82] 78 (09/07 1954) Resp:  [18-20] 20 (09/07 1954) BP: (137-154)/(47-88) 146/88 (09/07 1954) SpO2:  [94 %-100 %] 100 % (09/07 1954)  Psychiatric Specialty Exam:  Presentation  General Appearance: Appropriate for Environment; Casual (Lower lip piercing)  Eye Contact:Good  Speech:Clear and Coherent; Normal Rate  Speech Volume:Normal  Handedness:Right   Mood and Affect  Mood:Euthymic  Affect:Full Range; Appropriate   Thought Process  Thought Processes:Coherent; Goal Directed; Linear  Descriptions of Associations:Intact  Orientation:Full (Time, Place and Person)  Thought Content:Logical  History of Schizophrenia/Schizoaffective disorder:No  Duration of Psychotic Symptoms:No data recorded Hallucinations:Hallucinations: None  Ideas of Reference:None  Suicidal Thoughts:Suicidal Thoughts: No  Homicidal Thoughts:Homicidal Thoughts: No   Sensorium  Memory:Immediate Good; Recent Good  Judgment:Fair  Insight:Fair; Shallow   Executive Functions   Concentration:Good  Attention Span:Good  Recall:Good  Fund of Knowledge:Good  Language:Good   Psychomotor Activity  Psychomotor Activity:Psychomotor Activity: Tremor  Assets  Assets:Communication Skills; Desire for Improvement; Financial Resources/Insurance; Housing; Intimacy; Leisure Time; Social Support; Vocational/Educational   Sleep  Sleep:Sleep: Fair   Physical Exam: Physical Exam Vitals reviewed.  Constitutional:      General: He is not in acute distress.    Appearance: He is obese. He is not toxic-appearing.  HENT:     Head: Normocephalic and atraumatic.  Mouth/Throat:     Mouth: Mucous membranes are moist.     Pharynx: Oropharynx is clear.  Pulmonary:     Effort: Pulmonary effort is normal.  Skin:    Findings: Bruising present.  Neurological:     Mental Status: He is alert and oriented to person, place, and time.     Comments: Tremulousness of bilateral upper extremities   Blood pressure (!) 146/88, pulse 78, temperature 98.2 F (36.8 C), temperature source Oral, resp. rate 20, height  (1.905 m), weight (!) 163.3 kg, SpO2 100 %. Body mass index is 45 kg/m.

## 2022-06-08 NOTE — Hospital Course (Signed)
41 y.o. male with medical history significant of hypertension, hyperlipidemia, paroxysmal atrial fibrillation, CAD, anxiety, depression, polysubstance abuse (alcohol, tobacco, opiate, marijuana ), morbid obesity, and GERD who presented for complaints of palpitations. Pt admitted to heavy drinking over weekends, but decided to quit etoh and opiates. Pt did mention thoughts of self-harm thus was initially involuntarily committed. Found to be in afib RVR, thus admitted for further work up.

## 2022-06-08 NOTE — Progress Notes (Addendum)
Rounding Note    Patient Name: Scott Waller Date of Encounter: 06/08/2022  [facility] Cardiologist: Lance Muss, MD   Subjective   Patient resting comfortably in bed this morning and is without symptoms of palpitations, chest pain, shortness of breath.   Inpatient Medications    Scheduled Meds:  clopidogrel  75 mg Oral Daily   diltiazem  240 mg Oral Daily   enoxaparin (LOVENOX) injection  80 mg Subcutaneous Q24H   folic acid  1 mg Oral Daily   lamoTRIgine  200 mg Oral BID   LORazepam  0-4 mg Intravenous Q6H   Followed by   Melene Muller ON 06/09/2022] LORazepam  0-4 mg Intravenous Q12H   multivitamin with minerals  1 tablet Oral Daily   nicotine  21 mg Transdermal Daily   potassium chloride  60 mEq Oral Q4H   thiamine  100 mg Oral Daily   Or   thiamine  100 mg Intravenous Daily   torsemide  40 mg Oral BID   traZODone  150 mg Oral QHS   Continuous Infusions:  PRN Meds: acetaminophen **OR** acetaminophen, albuterol, ALPRAZolam, clobetasol cream, diclofenac Sodium, dicyclomine, hydrOXYzine, loperamide, LORazepam **OR** LORazepam, magnesium hydroxide, methocarbamol, Muscle Rub, naproxen, ondansetron **OR** ondansetron (ZOFRAN) IV, oxyCODONE, pantoprazole   Vital Signs    Vitals:   06/07/22 1900 06/07/22 2126 06/08/22 0500 06/08/22 0836  BP: (!) 153/94 (!) 148/89 (!) 138/47 (!) 143/77  Pulse: 91 82 82 77  Resp: 16 18 18 18   Temp: 98.7 F (37.1 C) 98.1 F (36.7 C) 98.5 F (36.9 C) 98.5 F (36.9 C)  TempSrc:  Oral Oral Oral  SpO2: 96%  94% 96%  Weight:      Height:  6\' 3"  (1.905 m)      Intake/Output Summary (Last 24 hours) at 06/08/2022 0949 Last data filed at 06/08/2022 0842 Gross per 24 hour  Intake 120 ml  Output 600 ml  Net -480 ml      06/07/2022    3:33 AM 05/12/2022    2:24 PM 04/22/2022    5:00 AM  Last 3 Weights  Weight (lbs) 360 lb 378 lb 3.2 oz 361 lb 8.9 oz  Weight (kg) 163.295 kg 171.55 kg 164 kg      Telemetry    Sinus rhythm -  Personally Reviewed  ECG    No ECG obtained today.   Physical Exam   GEN: No acute distress.   Neck: No JVD Cardiac: RRR, no murmurs, rubs, or gallops.  Respiratory: Clear to auscultation bilaterally. GI: Soft, nontender, non-distended  MS: No edema; No deformity. Neuro:  Nonfocal  Psych: Normal affect   Labs    High Sensitivity Troponin:   Recent Labs  Lab 06/07/22 0424  TROPONINIHS 35*     Chemistry Recent Labs  Lab 06/05/22 1635 06/06/22 1620 06/07/22 0424 06/08/22 0308  NA 136 135 137 137  K 3.6 3.4* 3.3* 3.1*  CL 93* 91* 95* 95*  CO2 26 25 24 28   GLUCOSE 80 74 90 85  BUN 10 5* <5* 8  CREATININE 0.97 0.96 1.05 1.03  CALCIUM 9.2 9.3 9.4 9.1  MG  --  2.0 1.8 1.9  PROT 7.4 7.5 7.3  --   ALBUMIN 3.5 3.5 3.4*  --   AST 99* 106* 109*  --   ALT 51* 54* 54*  --   ALKPHOS 86 89 90  --   BILITOT 2.5* 2.6* 2.8*  --   GFRNONAA >60 >60 >60 >60  ANIONGAP 17* 19* 18* 14    Lipids  Recent Labs  Lab 06/06/22 1620  CHOL 290*  TRIG 75  HDL 98  LDLCALC 177*  CHOLHDL 3.0    Hematology Recent Labs  Lab 06/06/22 1620 06/07/22 0424 06/08/22 0308  WBC 4.9 4.1 3.7*  RBC 4.05* 4.12* 3.69*  HGB 14.1 14.1 12.8*  HCT 40.3 42.4 37.6*  MCV 99.5 102.9* 101.9*  MCH 34.8* 34.2* 34.7*  MCHC 35.0 33.3 34.0  RDW 15.9* 15.9* 15.8*  PLT 123* 100* 102*   Thyroid  Recent Labs  Lab 06/06/22 1620  TSH 3.787    BNPNo results for input(s): "BNP", "PROBNP" in the last 168 hours.  DDimer No results for input(s): "DDIMER" in the last 168 hours.   Radiology    DG Chest Portable 1 View  Result Date: 06/07/2022 CLINICAL DATA:  Evaluate for palpitation EXAM: PORTABLE CHEST 1 VIEW COMPARISON:  04/19/2022 FINDINGS: Artifact from EKG leads. Normal heart size and mediastinal contours for portable technique. Mild interstitial prominence which is similar to prior. No acute infiltrate or Kerley line. No effusion or pneumothorax. No acute osseous findings. IMPRESSION: Stable exam.  No  evidence of acute disease. Electronically Signed   By: Tiburcio Pea M.D.   On: 06/07/2022 04:20    Cardiac Studies   Echo 04/21/22: 1. Left ventricular ejection fraction, by estimation, is 60 to 65%. The  left ventricle has normal function. The left ventricle has no regional  wall motion abnormalities. Left ventricular diastolic parameters were  normal.   2. Right ventricular systolic function is normal. The right ventricular  size is normal.   3. The mitral valve is normal in structure. No evidence of mitral valve  regurgitation. No evidence of mitral stenosis.   4. The aortic valve was not well visualized. There is mild calcification  of the aortic valve. Aortic valve regurgitation is not visualized. Aortic  valve sclerosis is present, with no evidence of aortic valve stenosis.   5. The inferior vena cava is normal in size with greater than 50%  respiratory variability, suggesting right atrial pressure of 3 mmHg.   Patient Profile     Scott Waller is a 41 y.o. male with a hx of remote single episode of Afib (2011), current smoker, anxiety, alcohol and opioid abuse, suicidal ideation with IVC, and obesity who is being seen 06/07/2022 for the evaluation of afib RVR at the request of Dr. Katrinka Blazing.  Assessment & Plan    Paroxysmal atrial fibrillation with RVR  Patient presented to [facility] ED with symptoms of palpitations that began as he was on his way to be admitted to [facility] for substance abuse treatment. ECG notable for atrial fibrillation with RVR and patient started on a Diltiazem infusion. He was admitted to medicine service with cardiology consult. Patient with spontaneous conversion to NSR yesterday morning.   Afib likely a result of alcohol and other substance detox including narcotics.  Continue oral Diltiazem 240mg  for rate control. Okay to continue home BB This patients CHA2DS2-VASc Score and unadjusted Ischemic Stroke Rate (% per year) is equal to 2.2 % stroke rate/year from a score  of 2 (HTN, CAD. However, currently not a candidate for anticoagulation. Continue Plavix and plan to reconsider DOAC in outpatient setting upon completion of alcohol detox. Resume home Torsemide    Hypertension Patient remains mildly hypertensive despite initiation of diltiazem. Was previously on ARB-HCTZ but recently d/c'd.  Alcohol detox may be contributing to HTN, would recommend close monitoring and outpatient  titration of anti-hypertensive regimen Resume home Metoprolol Succinate 50mg  today   CAD s/p stent to LAD in 2020  Continue Plavix. If OAC is started in future outpatient setting, switch to ASA for dual therapy with DOAC. Resume home Metoprolol 50mg   HLD 06/06/2022: Cholesterol 290; HDL 98; LDL Cholesterol 177; Triglycerides 75; VLDL 15  Patient has historically been intolerant of statins. Referred to lipid clinic for consideration of [id number]   Polysubstance abuse Suicidal Ideation, IVC  UDS notable for benzodiazepines, oxycodone, marijuana  Continue with CIWA protocol per primary team Patient stable from cardiac standpoint for a rehab program to help with his substance abuse   [facility] will sign off.   Medication Recommendations:  Continue oral Diltiazem 240mg . Resume home Metoprolol succinate 50mg .  Follow up with lipid clinic. Will arrange outpatient cardiology follow up.  For questions or updates, please contact [facility] Please consult [email or web address] for contact info under        Signed, , PA-C  06/08/2022, 9:49 AM    I have examined the patient and reviewed assessment and plan and discussed with patient.  Agree with above as stated.    Rhythm has been stable in normal sinus.  No further atrial fibrillation.  Breathing better after the dose of IV Lasix yesterday.  He does use torsemide as needed.  I counseled him to use this for both leg swelling and if his shortness of breath were to increase.  Stable from a medical  standpoint if he does need to go to a rehab program.    

## 2022-06-09 ENCOUNTER — Other Ambulatory Visit (HOSPITAL_COMMUNITY): Payer: Self-pay

## 2022-06-09 DIAGNOSIS — F10930 Alcohol use, unspecified with withdrawal, uncomplicated: Secondary | ICD-10-CM

## 2022-06-09 LAB — CBC
HCT: 38.4 % — ABNORMAL LOW (ref 39.0–52.0)
Hemoglobin: 13.2 g/dL (ref 13.0–17.0)
MCH: 34.6 pg — ABNORMAL HIGH (ref 26.0–34.0)
MCHC: 34.4 g/dL (ref 30.0–36.0)
MCV: 100.8 fL — ABNORMAL HIGH (ref 80.0–100.0)
Platelets: 106 10*3/uL — ABNORMAL LOW (ref 150–400)
RBC: 3.81 MIL/uL — ABNORMAL LOW (ref 4.22–5.81)
RDW: 15.4 % (ref 11.5–15.5)
WBC: 3.9 10*3/uL — ABNORMAL LOW (ref 4.0–10.5)
nRBC: 0 % (ref 0.0–0.2)

## 2022-06-09 LAB — COMPREHENSIVE METABOLIC PANEL
ALT: 53 U/L — ABNORMAL HIGH (ref 0–44)
AST: 96 U/L — ABNORMAL HIGH (ref 15–41)
Albumin: 2.9 g/dL — ABNORMAL LOW (ref 3.5–5.0)
Alkaline Phosphatase: 82 U/L (ref 38–126)
Anion gap: 12 (ref 5–15)
BUN: 8 mg/dL (ref 6–20)
CO2: 27 mmol/L (ref 22–32)
Calcium: 9.1 mg/dL (ref 8.9–10.3)
Chloride: 98 mmol/L (ref 98–111)
Creatinine, Ser: 1.17 mg/dL (ref 0.61–1.24)
GFR, Estimated: >60 mL/min (ref >60)
Glucose, Bld: 86 mg/dL (ref 70–99)
Potassium: 3.5 mmol/L (ref 3.5–5.1)
Sodium: 137 mmol/L (ref 135–145)
Total Bilirubin: 2.1 mg/dL — ABNORMAL HIGH (ref 0.3–1.2)
Total Protein: 6.4 g/dL — ABNORMAL LOW (ref 6.5–8.1)

## 2022-06-09 LAB — MAGNESIUM: Magnesium: 1.4 mg/dL — ABNORMAL LOW (ref 1.7–2.4)

## 2022-06-09 MED ORDER — DILTIAZEM HCL ER COATED BEADS 240 MG PO CP24
240.0000 mg | ORAL_CAPSULE | Freq: Every day | ORAL | 0 refills | Status: DC
  Filled 2022-06-09: qty 30, 30d supply, fill #0

## 2022-06-09 MED ORDER — OXYCODONE HCL 10 MG PO TABS: 10.0000 mg | ORAL_TABLET | Freq: Two times a day (BID) | ORAL | 0 refills | Status: AC | PRN

## 2022-06-09 MED ORDER — POTASSIUM CHLORIDE CRYS ER 20 MEQ PO TBCR
40.0000 meq | EXTENDED_RELEASE_TABLET | Freq: Once | ORAL | Status: AC
Start: 2022-06-09 — End: 2022-06-09
  Administered 2022-06-09: 40 meq via ORAL
  Filled 2022-06-09: qty 2

## 2022-06-09 MED ORDER — MAGNESIUM SULFATE 4 GM/100ML IV SOLN
4.0000 g | Freq: Once | INTRAVENOUS | Status: AC
  Administered 2022-06-09: 4 g via INTRAVENOUS
  Filled 2022-06-09: qty 100

## 2022-06-09 NOTE — Discharge Summary (Signed)
Physician Discharge Summary   Patient: Scott Waller MRN: [id number] DOB: 1980-12-21  Admit date:     06/07/2022  Discharge date: 06/09/22  Discharge Physician: Rickey Barbara   PCP: April Manson, NP   Recommendations at discharge:    Follow up with PCP as scheduled Follow up with Cardiology  Discharge Diagnoses: Principal Problem:   Atrial fibrillation with rapid ventricular response (HCC) Active Problems:   Alcohol abuse with withdrawal without complication (HCC)   Involuntary commitment   Hypokalemia   Essential hypertension   Macrocytosis without anemia   Opiate dependence (HCC)   CAD (coronary artery disease)   Transaminitis   Hyperbilirubinemia   Tobacco abuse   Generalized anxiety disorder   Hyperlipidemia LDL goal <70   Morbid obesity with BMI of 45.0-49.9, adult (HCC)   Psoriatic arthritis (HCC)   Alcohol withdrawal (HCC)  Resolved Problems:   * No resolved hospital problems. *  Hospital Course: 41 y.o. male with medical history significant of hypertension, hyperlipidemia, paroxysmal atrial fibrillation, CAD, anxiety, depression, polysubstance abuse (alcohol, tobacco, opiate, marijuana ), morbid obesity, and GERD who presented for complaints of palpitations. Pt admitted to heavy drinking over weekends, but decided to quit etoh and opiates. Pt did mention thoughts of self-harm thus was initially involuntarily committed. Found to be in afib RVR, thus admitted for further work up.  Assessment and Plan: Paroxysmal atrial fibrillation with RVR Patient complained of palpitations while at behavioral health and was found to be in A-fib with RVR with heart rates elevated up into the 170s.  Patient declined cardioversion and was started on a diltiazem drip with bolus.  Heart rates currently improved less than 100.  TSH was noted to be within normal limits at 3.787 on 9/5.  He just recently had echocardiogram 04/2022 which noted EF of 60-65% with normal diastolic parameters.  CHA2DS2-VASc score = 2.  Patient has prior history of atrial fibrillation from what records note 05/2011. -Cardiology was consulted and pt was transitioned off cardizem gtt to PO -Per Cardiology, afib is likely result of etoh and other substance detox. Recs to cont cardizem 240mg  with home BB -Not a candidate for anticoagulation per Cardiology. Cont plavix and plan reconsideration of DOAC in outpatient setting -Cardiology has signed off   Alcohol abuse with withdrawal without complication Patient had initially presented to behavioral health due to alcohol withdrawal. -CIWA protocols with Ativan scheduled and as needed -Thiamine, folate, MVI -Transitions of care consulted for alcohol and substance abuse.   Suicidal ideation involuntary commitment Patient was documented to have said that he wanted to blow his brains out with a gun that he has at home.  He now reports that he just said that he wanted to harm himself to get admitted into the behavioral health hospital at the time.  He had been involuntary committed. -Psychiatry consulted and cleared pt from involuntary commitment. Psychiatry signed off -Recs to continue lamictal bid   Hypokalemia Acute.  Potassium 3.3 on admission. -K down to 3.1, will replace -Recheck bmet in AM   Essential hypertension Blood pressures initially elevated up to 180/104.  -BP improved. Cont current regimen   Macrocytosis without anemia Patient with hemoglobin 14.1 with MCV 102.9 and MCH 34.2.  Given history of alcohol abuse suspect admit B12 and/or folate deficiency. -B12 normal -Continue folate replacement   Opioid dependencechronic pain Patient report that he wants to get off of opioids and has been taking oxycodone 10 mg 4 times daily prior to recently running out of the  medication.  At this time he notes that he may need to slowly cut back at first as getting off alcohol and the pain medicines all at once may be too much. -Decreased oxycodone 10 mg  p.o. twice daily  -As needed medications from the clonidine detox protocol started while in hospital -Pt requested prescription for additional narcotics on d/c . NCCSR reviewed. Pt was last dispensed oxy 10mg  120 tabs/30 day supply on 8/14. As pt should still have tabs remaining, and since narcotics are prescribed chronically by outpt provider, no narcotics will be dispensed here   CAD chest pain Patient reported that he had some cramping of the left side of his chest related to his palpitations.  Home medication regimen previously included Crestor and aspirin 21 mg daily. -Continue statin -Aspirin held due to starting anticoagulation   Transaminitis and hyperbilirubinemia Acute on chronic.  AST 109, ALT 54, and total bilirubin 2.8.  AST to ALT ratio is greater than 1.5 to suggest likely related with patient's history of alcohol abuse. -Improved   Thrombocytopenia Acute on chronic.  Platelet count 100, but appears chronically low.  Suspect secondary to patient's history of alcohol abuse.   Tobacco abuse Patient smokes a pack of cigarettes per day on average. -Nicotine patch offered   Psoriatic arthritis Chronic -Continue outpatient follow-up   General anxiety disorder -Continue Ativan IV as needed for fracture CIWA protocol   Hyperlipidemia Last LDL was 171 -Continue statin   Laceration to the bilateral shins Patient reports that he sustained a lacerations to the bilateral shins after slipping on a dock a while ago. -Continue local wound care -Wounds do not appear infected. Cont dressings   Morbid obesity BMI 45.00 kg/m       Consultants: Cardiology, psychiatry Procedures performed:   Disposition: Home Diet recommendation:  Cardiac diet DISCHARGE MEDICATION: Allergies as of 06/09/2022       Reactions   Statins Other (See Comments)   Myalgias        Medication List     TAKE these medications    acetaminophen 500 MG tablet Commonly known as: TYLENOL Take  1,000 mg by mouth every 4 (four) hours as needed (breakthrough pain).   albuterol (2.5 MG/3ML) 0.083% nebulizer solution Commonly known as: PROVENTIL Take 3 mLs (2.5 mg total) by nebulization every 4 (four) hours as needed for wheezing or shortness of breath.   albuterol 108 (90 Base) MCG/ACT inhaler Commonly known as: VENTOLIN HFA Inhale 2 puffs into the lungs every 6 (six) hours as needed for wheezing or shortness of breath.   ALPRAZolam 1 MG tablet Commonly known as: XANAX Take 1 mg by mouth 3 (three) times daily as needed for anxiety.   clobetasol cream 0.05 % Commonly known as: TEMOVATE Apply 1 Application topically 2 (two) times daily as needed (For psoriasis).   Clobetasol Propionate 0.05 % external spray Commonly known as: TEMOVATE Apply 1 application  topically 2 (two) times daily as needed (For psoriasis).   clopidogrel 75 MG tablet Commonly known as: PLAVIX Take 75 mg by mouth daily.   Cosentyx Sensoready (300 MG) 150 MG/ML Soaj Generic drug: Secukinumab (300 MG Dose) Inject 300 mg into the skin every 30 (thirty) days.   diltiazem 240 MG 24 hr capsule Commonly known as: CARDIZEM CD Take 1 capsule (240 mg total) by mouth daily. Start taking on: June 10, 2022   ibuprofen 200 MG tablet Commonly known as: ADVIL Take 800 mg by mouth every 4 (four) hours as needed (breakthrough  pain).   Icy Hot 7.5 % (Roll) Misc Generic drug: Menthol (Topical Analgesic) Apply 1 each topically daily as needed (arthritis, pain).   lamoTRIgine 200 MG tablet Commonly known as: LAMICTAL Take 200 mg by mouth 2 (two) times daily.   metoprolol succinate 50 MG 24 hr tablet Commonly known as: TOPROL-XL Take 50 mg by mouth daily.   Milk of Magnesia 400 MG/5ML suspension Generic drug: magnesium hydroxide Take 30 mLs by mouth daily as needed for mild constipation.   multivitamin with minerals Tabs tablet Take 1 tablet by mouth daily.   nitroGLYCERIN 0.4 MG SL tablet Commonly  known as: NITROSTAT Place 0.4 mg under the tongue every 5 (five) minutes x 3 doses as needed. If no improvement, call 911   ondansetron 8 MG disintegrating tablet Commonly known as: Zofran ODT Take 1 tablet (8 mg total) by mouth every 8 (eight) hours as needed for nausea or vomiting. What changed:  when to take this additional instructions   Oxycodone HCl 10 MG Tabs Take 1 tablet (10 mg total) by mouth 2 (two) times daily as needed (pain). What changed:  when to take this reasons to take this   pantoprazole 40 MG tablet Commonly known as: PROTONIX Take 40 mg by mouth daily as needed (heartburn).   potassium chloride SA 20 MEQ tablet Commonly known as: KLOR-CON M Take 20 mEq by mouth daily.   Stool Softener 100 MG capsule Generic drug: docusate sodium Take 100 mg by mouth 2 (two) times daily.   torsemide 20 MG tablet Commonly known as: DEMADEX Take 40 mg by mouth 2 (two) times daily.   traZODone 150 MG tablet Commonly known as: DESYREL Take 150 mg by mouth at bedtime.   Voltaren 1 % Gel Generic drug: diclofenac Sodium Apply 2 g topically 4 (four) times daily as needed (arthritis, pain).        Follow-up Information     Corky Crafts, MD .   Specialties: Cardiology, Radiology, Interventional Cardiology Contact information: [address] [place] [address] [phone]         April Manson, NP. Go on 06/19/2022.   Specialty: Family Medicine Why: @10 : information: [address] [place] [phone]                Discharge Exam: [phone] Weights   06/07/22 08/07/22 06/09/22 0357  Weight: (!) 163.3 kg (!) 154.3 kg   General exam: Awake, laying in bed, in nad Respiratory system: Normal respiratory effort, no wheezing Cardiovascular system: regular rate, s1, s2 Gastrointestinal system: Soft, nondistended, positive BS Central nervous system: CN2-12 grossly intact, strength  intact Extremities: Perfused, no clubbing Skin: Normal skin turgor, no notable skin lesions seen Psychiatry: Mood normal // no visual hallucinations   Condition at discharge: fair  The results of significant diagnostics from this hospitalization (including imaging, microbiology, ancillary and laboratory) are listed below for reference.   Imaging Studies: DG Chest Portable 1 View  Result Date: 06/07/2022 CLINICAL DATA:  Evaluate for palpitation EXAM: PORTABLE CHEST 1 VIEW COMPARISON:  04/19/2022 FINDINGS: Artifact from EKG leads. Normal heart size and mediastinal contours for portable technique. Mild interstitial prominence which is similar to prior. No acute infiltrate or Kerley line. No effusion or pneumothorax. No acute osseous findings. IMPRESSION: Stable exam.  No evidence of acute disease. Electronically Signed   By: 04/21/2022 M.D.   On: 06/07/2022 04:20   DG Tibia/Fibula Right  Result Date: 06/05/2022  CLINICAL DATA:  injury to both legs EXAM: RIGHT TIBIA AND FIBULA - 2 VIEW COMPARISON:  None Available. FINDINGS: Well-corticated osseous fragments along the inferior aspect of the patellofemoral joint on lateral view may represent an old fracture versus heterotopic calcification. Patellar alignment on frontal view appears laterally subluxed which may be due to patient positioning. There is no evidence of fracture or other focal bone lesions. Soft tissues are unremarkable. IMPRESSION: 1. No acute displaced fracture or dislocation. 2. Patellar alignment on frontal view appears laterally subluxed which may be due to patient positioning. Recommend sunrise view for further evaluation. Electronically Signed   By: Tish FredericksonMorgane  Naveau M.D.   On: 06/05/2022 17:24   DG Tibia/Fibula Left  Result Date: 06/05/2022 CLINICAL DATA:  injury to both legs EXAM: LEFT TIBIA AND FIBULA - 2 VIEW COMPARISON:  None Available. FINDINGS: There is no evidence of fracture or other focal bone lesions. Soft tissues are  unremarkable. IMPRESSION: Negative. Electronically Signed   By: Tish FredericksonMorgane  Naveau M.D.   On: 06/05/2022 17:22    Microbiology: Results for orders placed or performed during the hospital encounter of 06/06/22  Resp Panel by RT-PCR (Flu A&B, Covid) Anterior Nasal Swab     Status: None   Collection Time: 06/06/22  4:28 PM   Specimen: Anterior Nasal Swab  Result Value Ref Range Status   SARS Coronavirus 2 by RT PCR NEGATIVE NEGATIVE Final    Comment: (NOTE) SARS-CoV-2 target nucleic acids are NOT DETECTED.  The SARS-CoV-2 RNA is generally detectable in upper respiratory specimens during the acute phase of infection. The lowest concentration of SARS-CoV-2 viral copies this assay can detect is 138 copies/mL. A negative result does not preclude SARS-Cov-2 infection and should not be used as the sole basis for treatment or other patient management decisions. A negative result may occur with  improper specimen collection/handling, submission of specimen other than nasopharyngeal swab, presence of viral mutation(s) within the areas targeted by this assay, and inadequate number of viral copies(<138 copies/mL). A negative result must be combined with clinical observations, patient history, and epidemiological information. The expected result is Negative.  Fact Sheet for Patients:  BloggerCourse.comhttps://www.fda.gov/media/152166/download  Fact Sheet for Healthcare Providers:  SeriousBroker.ithttps://www.fda.gov/media/152162/download  This test is no t yet approved or cleared by the [place] FDA and  has been authorized for detection and/or diagnosis of SARS-CoV-2 by FDA under an Emergency Use Authorization (EUA). This EUA will remain  in effect (meaning this test can be used) for the duration of the COVID-19 declaration under Section 564(b)(1) of the Act, 21 U.S.C.section 360bbb-3(b)(1), unless the authorization is terminated  or revoked sooner.       Influenza A by PCR NEGATIVE NEGATIVE Final   Influenza B by PCR  NEGATIVE NEGATIVE Final    Comment: (NOTE) The Xpert Xpress SARS-CoV-2/FLU/RSV plus assay is intended as an aid in the diagnosis of influenza from Nasopharyngeal swab specimens and should not be used as a sole basis for treatment. Nasal washings and aspirates are unacceptable for Xpert Xpress SARS-CoV-2/FLU/RSV testing.  Fact Sheet for Patients: BloggerCourse.comhttps://www.fda.gov/media/152166/download  Fact Sheet for Healthcare Providers: SeriousBroker.ithttps://www.fda.gov/media/152162/download  This test is not yet approved or cleared by the [place] FDA and has been authorized for detection and/or diagnosis of SARS-CoV-2 by FDA under an Emergency Use Authorization (EUA). This EUA will remain in effect (meaning this test can be used) for the duration of the COVID-19 declaration under Section 564(b)(1) of the Act, 21 U.S.C. section 360bbb-3(b)(1), unless the authorization is terminated or revoked.  Performed at [facility], [address] [place], [place] [address]     Labs: CBC: Recent Labs  Lab 06/05/22 1635 06/06/22 1620 06/07/22 0424 06/08/22 0308 06/09/22 0537  WBC 4.5 4.9 4.1 3.7* 3.9*  NEUTROABS 3.4 3.5 2.9  --   --   HGB 13.6 14.1 14.1 12.8* 13.2  HCT 40.4 40.3 42.4 37.6* 38.4*  MCV 102.0* 99.5 102.9* 101.9* 100.8*  PLT 125* 123* 100* 102* 106*   Basic Metabolic Panel: Recent Labs  Lab 06/05/22 1635 06/06/22 1620 06/07/22 0424 06/08/22 0308 06/09/22 0537  NA 136 135 137 137 137  K 3.6 3.4* 3.3* 3.1* 3.5  CL 93* 91* 95* 95* 98  CO2 26 25 24 28 27   GLUCOSE 80 74 90 85 86  BUN 10 5* <5* 8 8  CREATININE 0.97 0.96 1.05 1.03 1.17  CALCIUM 9.2 9.3 9.4 9.1 9.1  MG  --  2.0 1.8 1.9 1.4*  PHOS  --   --  2.2*  --   --    Liver Function Tests: Recent Labs  Lab 06/05/22 1635 06/06/22 1620 06/07/22 0424 06/09/22 0537  AST 99* 106* 109* 96*  ALT 51* 54* 54* 53*  ALKPHOS 86 89 90 82  BILITOT 2.5* 2.6* 2.8* 2.1*  PROT 7.4 7.5 7.3 6.4*  ALBUMIN 3.5 3.5 3.4* 2.9*    CBG: No results for input(s): "GLUCAP" in the last 168 hours.  Discharge time spent: less than 30 minutes.  Signed: 08/09/22, MD [facility] 06/09/2022

## 2022-06-09 NOTE — TOC Transition Note (Signed)
Transition of Care [facility]) - CM/SW Discharge Note   Patient Details  Name: Scott Waller MRN: [id number] Date of Birth: 03/05/1981  [facility]) CM/SW Contact:  Leone Haven, RN Phone Number: 06/09/2022, 1:40 PM   Clinical Narrative:    Per CSW note patient will dc to home and follow [facility], he has a follow up apt on [facility].    Final next level of care: Home/Self Care Barriers to Discharge: Continued Medical Work up   Patient Goals and CMS Choice        Discharge Placement                       Discharge Plan and Services                                     Social Determinants of Health (SDOH) Interventions     Readmission Risk Interventions     No data to display

## 2022-06-09 NOTE — TOC Progression Note (Addendum)
Transition of Care [facility]) - Progression Note    Patient Details  Name: Kemari Narez MRN: [id number] Date of Birth: 06-16-1981  Transition of Care [facility]) CM/SW Contact  Eduard Roux, [place] Phone Number: 06/09/2022, 4:03 PM  Clinical Narrative:     Rescinded IVC- forms signed by MD and faxed to [facility]- fax confirmation & form given to Nurse Secretary to place in the chart.   TOC will signed off at this time.   Antony Blackbird, MSW, LCSW Clinical Social Worker    Expected Discharge Plan: Home/Self Care Barriers to Discharge: Continued Medical Work up  Expected Discharge Plan and Services Expected Discharge Plan: Home/Self Care       Living arrangements for the past 2 months: Single Family Home Expected Discharge Date: 06/09/22                                     Social Determinants of Health (SDOH) Interventions    Readmission Risk Interventions     No data to display

## 2022-06-11 NOTE — Progress Notes (Unsigned)
Patient ID: Scott Waller                 DOB: 1981-07-28                    MRN: [id number]     HPI: Scott Waller is a 41 y.o. male patient referred to lipid clinic by Dr. Eldridge Dace. PMH is significant for hypertension, diabetes, obesity, unstablea angina, family history of premature CAD, tobacco abuse with 20-pack-year history of smoking, and EtOH drinks a sixpack daily. Pt had a negative stress test in 2016. In 2020, pt was hospitalized for chest pain and cardiac cath revealed 95% blockage in proximal LAD that was successfully stented. Pt was last seen by Dr. Eldridge Dace on 05/12/22 where aspirin was transitioned to Plavix and a referral was placed for lipid clinic due to statin intolerance. Pt reported to the ED on 06/06/22 for afib RVR, but was discharged 2 days later. LFTs were elevated during hospital course.  Insurance: [employer] +/- pravastatin  Current Medications:  Intolerances: rosuvastatin 10 & 20 mg daily, atorvastatin 80 mg daily Risk Factors: premature ASCVD, smoking, family history, alcohol abuse, HTN, DM2 LDL goal: <55 mg/dL  Lipid panel 11/09/29 on no treatment: TC 290, TG 75, HDL 98, LDL 177  Lipid panel 10/16/18 on atorvastatin 80 mg daily with questionable adherence: TC 211, TG 149, HDL 48, VLDL 30, LDL 133  Diet:   Exercise:   Family History: The patient's family history includes Hypertension in his father. Both grandfathers had MI, one in his 50s and the other in his 36s.  Social History: The patient  reports that he has been smoking cigarettes. He has a 25.00 pack-year smoking history. He has never used smokeless tobacco. He reports current alcohol use of about 20.0 - 24.0 standard drinks of alcohol per week. He reports that he does not use drugs.   Labs:  Past Medical History:  Diagnosis Date   AKI (acute kidney injury) (HCC)    Anxiety and depression    Arthritis    Asthma    Bronchitis    CAD (coronary artery disease)    a. 10/15/2018 DES  to prox LAD   GERD (gastroesophageal reflux disease)    History of atrial fibrillation    During alcohol binge   Hyperlipidemia with target LDL less than 70    Hypertension    Tobacco abuse     Current Outpatient Medications on File Prior to Visit  Medication Sig Dispense Refill   acetaminophen (TYLENOL) 500 MG tablet Take 1,000 mg by mouth every 4 (four) hours as needed (breakthrough pain).     albuterol (PROVENTIL) (2.5 MG/3ML) 0.083% nebulizer solution Take 3 mLs (2.5 mg total) by nebulization every 4 (four) hours as needed for wheezing or shortness of breath. 75 mL 12   albuterol (VENTOLIN HFA) 108 (90 Base) MCG/ACT inhaler Inhale 2 puffs into the lungs every 6 (six) hours as needed for wheezing or shortness of breath.     ALPRAZolam (XANAX) 1 MG tablet Take 1 mg by mouth 3 (three) times daily as needed for anxiety.     clobetasol cream (TEMOVATE) 0.05 % Apply 1 Application topically 2 (two) times daily as needed (For psoriasis).     Clobetasol Propionate (TEMOVATE) 0.05 % external spray Apply 1 application  topically 2 (two) times daily as needed (For psoriasis).     clopidogrel (PLAVIX) 75 MG tablet Take 75 mg by mouth daily.  COSENTYX SENSOREADY, 300 MG, 150 MG/ML SOAJ Inject 300 mg into the skin every 30 (thirty) days.     diclofenac Sodium (VOLTAREN) 1 % GEL Apply 2 g topically 4 (four) times daily as needed (arthritis, pain).     diltiazem (CARDIZEM CD) 240 MG 24 hr capsule Take 1 capsule (240 mg total) by mouth daily. 30 capsule 0   docusate sodium (STOOL SOFTENER) 100 MG capsule Take 100 mg by mouth 2 (two) times daily.     ibuprofen (ADVIL) 200 MG tablet Take 800 mg by mouth every 4 (four) hours as needed (breakthrough pain).     lamoTRIgine (LAMICTAL) 200 MG tablet Take 200 mg by mouth 2 (two) times daily.     magnesium hydroxide (MILK OF MAGNESIA) 400 MG/5ML suspension Take 30 mLs by mouth daily as needed for mild constipation.     Menthol, Topical Analgesic, (ICY HOT) 7.5  % (Roll) MISC Apply 1 each topically daily as needed (arthritis, pain).     metoprolol succinate (TOPROL-XL) 50 MG 24 hr tablet Take 50 mg by mouth daily.     Multiple Vitamin (MULTIVITAMIN WITH MINERALS) TABS tablet Take 1 tablet by mouth daily.     nitroGLYCERIN (NITROSTAT) 0.4 MG SL tablet Place 0.4 mg under the tongue every 5 (five) minutes x 3 doses as needed. If no improvement, call 911  0   ondansetron (ZOFRAN-ODT) 8 MG disintegrating tablet Take 1 tablet (8 mg total) by mouth every 8 (eight) hours as needed for nausea or vomiting. (Patient taking differently: Take 8 mg by mouth See admin instructions. Take 8 mg every 8 hours as needed for nausea for 2-3 days after Cosentyx) 20 tablet 0   oxyCODONE 10 MG TABS Take 1 tablet (10 mg total) by mouth 2 (two) times daily as needed (pain). 30 tablet 0   pantoprazole (PROTONIX) 40 MG tablet Take 40 mg by mouth daily as needed (heartburn).     potassium chloride SA (KLOR-CON M) 20 MEQ tablet Take 20 mEq by mouth daily.     torsemide (DEMADEX) 20 MG tablet Take 40 mg by mouth 2 (two) times daily.     traZODone (DESYREL) 150 MG tablet Take 150 mg by mouth at bedtime.     No current facility-administered medications on file prior to visit.    Allergies  Allergen Reactions   Statins Other (See Comments)    Myalgias    Assessment/Plan:  1. Hyperlipidemia -

## 2022-06-12 ENCOUNTER — Ambulatory Visit: Payer: Managed Care, Other (non HMO)

## 2022-06-28 ENCOUNTER — Ambulatory Visit: Payer: Managed Care, Other (non HMO) | Attending: Physician Assistant | Admitting: Nurse Practitioner

## 2022-06-28 ENCOUNTER — Encounter: Payer: Self-pay | Admitting: Nurse Practitioner

## 2022-06-28 VITALS — BP 132/76 | HR 88 | Ht 75.0 in | Wt 343.0 lb

## 2022-06-28 DIAGNOSIS — Z862 Personal history of diseases of the blood and blood-forming organs and certain disorders involving the immune mechanism: Secondary | ICD-10-CM

## 2022-06-28 DIAGNOSIS — R5383 Other fatigue: Secondary | ICD-10-CM

## 2022-06-28 DIAGNOSIS — I10 Essential (primary) hypertension: Secondary | ICD-10-CM

## 2022-06-28 DIAGNOSIS — I2510 Atherosclerotic heart disease of native coronary artery without angina pectoris: Secondary | ICD-10-CM | POA: Diagnosis not present

## 2022-06-28 DIAGNOSIS — E785 Hyperlipidemia, unspecified: Secondary | ICD-10-CM | POA: Diagnosis not present

## 2022-06-28 DIAGNOSIS — F32A Depression, unspecified: Secondary | ICD-10-CM

## 2022-06-28 DIAGNOSIS — I480 Paroxysmal atrial fibrillation: Secondary | ICD-10-CM | POA: Diagnosis not present

## 2022-06-28 DIAGNOSIS — T148XXA Other injury of unspecified body region, initial encounter: Secondary | ICD-10-CM

## 2022-06-28 MED ORDER — DILTIAZEM HCL ER COATED BEADS 240 MG PO CP24: 240.0000 mg | ORAL_CAPSULE | Freq: Every day | ORAL | 3 refills | Status: DC

## 2022-06-28 NOTE — Progress Notes (Signed)
Cardiology Office Note:    Date:  06/28/2022   ID:  Scott Waller, DOB Jan 28, 1981, MRN [id number]  PCP:  Jettie Booze, NP   [place] Providers Cardiologist:  Larae Grooms, MD     Referring MD: Jettie Booze, NP   CC: Here for hospital f/u  History of Present Illness:    Scott Waller is a 41 y.o. male with a hx of the following:  CAD, s/p DES to proximal LAD in 2020 HTN HLD PAF AKI GAD Polysubstance abuse Morbid obesity Moderate to severe hepatic steatosis with fatty liver GERD  Presented to the hospital on June 07, 2022 with chief complaint of palpitations.  He has stated in the ED that he and his wife decided to stop drinking alcohol and stop pain medication.  Presented to behavioral health the day before with suicidal ideation, therefore involuntary committed. He stated in the ED he did this so he could get admitted to the hospital and get the help he needed to get off opiates and alcohol.  He was in the process of being transported from urgent care to behavioral health Hospital when [facility] police noted that patient was reporting that his heart felt like it was fluttering.  He has stated he has similar symptoms around 12 years ago and was on Cardizem for a period of time before being switched to a different medication.  Also reported some left-sided chest cramping, dry heaves, and had some nausea. Chest x-ray negative.  UDS notable for oxycodone, marijuana, and benzodiazepines.  EKG showed A-fib with RVR, 120 bpm, received Cardizem drip and spontaneously converted to NSR, transition to oral diltiazem, despite CHA2DS2-VASc score 2, he was found not to be a candidate for anticoagulation due to history of alcohol detox, plan to reconsider DOAC in outpatient setting.  Was referred to lipid clinic for consideration of Repatha/Praluent.  Today he presents for hospital follow-up with his wife.  Denies any alcohol use or illicit drug use recently.  Denies any  chest pain, but has chronic intermittent shortness of breath, attributes this to his history of asthma as well as anxiety.  Does admit to recent fatigue and wife who is present in the room reports he has had significant bruising that has worsened recently, also notes blood boils on his skin.  Has had palpitations maybe twice since hospitalization, says he takes a deep breath and coughs and then this helps, said it only lasted a second.  Denies any hemoptysis, melena, hematuria, syncope, presyncope, dizziness, claudication, orthopnea, or PND.  Wife is requesting if there are any resources for earlier therapy referral, since it will be a while before his appointment with therapy.    Past Surgical History:  Procedure Laterality Date   APPENDECTOMY     CORONARY STENT INTERVENTION N/A 10/15/2018   Procedure: CORONARY STENT INTERVENTION;  Surgeon: Jettie Booze, MD;  Location: [place] [facility];  Service: Cardiovascular;  Laterality: N/A;   INTRAVASCULAR ULTRASOUND/IVUS N/A 10/15/2018   Procedure: Intravascular Ultrasound/IVUS;  Surgeon: Jettie Booze, MD;  Location: [place] [facility];  Service: Cardiovascular;  Laterality: N/A;   LEFT HEART CATH AND CORONARY ANGIOGRAPHY N/A 10/15/2018   Procedure: LEFT HEART CATH AND CORONARY ANGIOGRAPHY;  Surgeon: Jettie Booze, MD;  Location: [place] [facility];  Service: Cardiovascular;  Laterality: N/A;    Current Medications: Current Meds  Medication Sig   acetaminophen (TYLENOL) 500 MG tablet Take 1,000 mg by mouth every 4 (four) hours as needed (breakthrough pain).  albuterol (PROVENTIL) (2.5 MG/3ML) 0.083% nebulizer solution Take 3 mLs (2.5 mg total) by nebulization every 4 (four) hours as needed for wheezing or shortness of breath.   albuterol (VENTOLIN HFA) 108 (90 Base) MCG/ACT inhaler Inhale 2 puffs into the lungs every 6 (six) hours as needed for wheezing or shortness of breath.   ALPRAZolam (XANAX) 1 MG tablet Take 1 mg by mouth 3  (three) times daily as needed for anxiety.   ciprofloxacin (CIPRO) 250 MG tablet Take 250 mg by mouth 2 (two) times daily.   clobetasol cream (TEMOVATE) 3.29 % Apply 1 Application topically 2 (two) times daily as needed (For psoriasis).   Clobetasol Propionate (TEMOVATE) 0.05 % external spray Apply 1 application  topically 2 (two) times daily as needed (For psoriasis).   clopidogrel (PLAVIX) 75 MG tablet Take 75 mg by mouth daily.   COSENTYX SENSOREADY, 300 MG, 150 MG/ML SOAJ Inject 300 mg into the skin every 30 (thirty) days.   diclofenac Sodium (VOLTAREN) 1 % GEL Apply 2 g topically 4 (four) times daily as needed (arthritis, pain).   docusate sodium (STOOL SOFTENER) 100 MG capsule Take 100 mg by mouth 2 (two) times daily.   ibuprofen (ADVIL) 200 MG tablet Take 800 mg by mouth every 4 (four) hours as needed (breakthrough pain).   lamoTRIgine (LAMICTAL) 200 MG tablet Take 200 mg by mouth 2 (two) times daily.   magnesium hydroxide (MILK OF MAGNESIA) 400 MG/5ML suspension Take 30 mLs by mouth daily as needed for mild constipation.   Menthol, Topical Analgesic, (ICY HOT) 7.5 % (Roll) MISC Apply 1 each topically daily as needed (arthritis, pain).   Multiple Vitamin (MULTIVITAMIN WITH MINERALS) TABS tablet Take 1 tablet by mouth daily.   nitroGLYCERIN (NITROSTAT) 0.4 MG SL tablet Place 0.4 mg under the tongue every 5 (five) minutes x 3 doses as needed. If no improvement, call 911   ondansetron (ZOFRAN-ODT) 8 MG disintegrating tablet Take 1 tablet (8 mg total) by mouth every 8 (eight) hours as needed for nausea or vomiting.   oxyCODONE 10 MG TABS Take 1 tablet (10 mg total) by mouth 2 (two) times daily as needed (pain).   pantoprazole (PROTONIX) 40 MG tablet Take 40 mg by mouth daily as needed (heartburn).   potassium chloride SA (KLOR-CON M) 20 MEQ tablet Take 20 mEq by mouth daily.   torsemide (DEMADEX) 20 MG tablet Take 40 mg by mouth 2 (two) times daily.   traZODone (DESYREL) 150 MG tablet Take 150  mg by mouth at bedtime.   [DISCONTINUED] diltiazem (CARDIZEM CD) 240 MG 24 hr capsule Take 1 capsule (240 mg total) by mouth daily.     Allergies:   Statins   Social History   Socioeconomic History   Marital status: Married    Spouse name: Not on file   Number of children: Not on file   Years of education: Not on file   Highest education level: Not on file  Occupational History   Not on file  Tobacco Use   Smoking status: Every Day    Packs/day: 1.00    Years: 25.00    Total pack years: 25.00    Types: Cigarettes   Smokeless tobacco: Never  Vaping Use   Vaping Use: Never used  Substance and Sexual Activity   Alcohol use: Yes    Alcohol/week: 20.0 - 24.0 standard drinks of alcohol    Types: 20 - 24 Cans of beer per week   Drug use: No   Sexual  activity: Yes    Partners: Female  Other Topics Concern   Not on file  Social History Narrative   Not on file   Social Determinants of Health   Financial Resource Strain: Not on file  Food Insecurity: No Food Insecurity (06/08/2022)   Hunger Vital Sign    Worried About Running Out of Food in the Last Year: Never true    Ran Out of Food in the Last Year: Never true  Transportation Needs: No Transportation Needs (06/08/2022)   PRAPARE - Hydrologist (Medical): No    Lack of Transportation (Non-Medical): No  Physical Activity: Not on file  Stress: Not on file  Social Connections: Not on file     Family History: The patient's family history includes Hypertension in his father.  ROS:   Review of Systems  Constitutional:  Positive for malaise/fatigue. Negative for chills, diaphoresis, fever and weight loss.  HENT: Negative.    Eyes: Negative.   Respiratory:  Positive for cough and shortness of breath. Negative for hemoptysis, sputum production and wheezing.        History of asthma.  See HPI.  Cardiovascular:  Positive for palpitations and leg swelling. Negative for chest pain, orthopnea,  claudication and PND.       Improved, chronic BLE.  Gastrointestinal: Negative.   Genitourinary: Negative.   Musculoskeletal: Negative.        [place].  Skin:        Chronic skin discoloration along BLE.  Skin tears/venous ulcers along BLE, with recent bruising, blood boils along BUE.  Neurological: Negative.   Endo/Heme/Allergies:  Negative for environmental allergies and polydipsia. Bruises/bleeds easily.  Psychiatric/Behavioral:  Positive for depression. Negative for hallucinations, memory loss, substance abuse and suicidal ideas. The patient is nervous/anxious. The patient does not have insomnia.     Please see the history of present illness.    All other systems reviewed and are negative.  EKGs/Labs/Other Studies Reviewed:    The following studies were reviewed today:   EKG:  EKG is ordered today.  The ekg ordered today demonstrates normal sinus rhythm, 84 bpm, right axis deviation, pulmonary pattern, nonspecific ST segment changes, otherwise nothing acute.  2D echocardiogram on April 21, 2022:  1. Left ventricular ejection fraction, by estimation, is 60 to 65%. The  left ventricle has normal function. The left ventricle has no regional  wall motion abnormalities. Left ventricular diastolic parameters were  normal.   2. Right ventricular systolic function is normal. The right ventricular  size is normal.   3. The mitral valve is normal in structure. No evidence of mitral valve  regurgitation. No evidence of mitral stenosis.   4. The aortic valve was not well visualized. There is mild calcification  of the aortic valve. Aortic valve regurgitation is not visualized. Aortic  valve sclerosis is present, with no evidence of aortic valve stenosis.   5. The inferior vena cava is normal in size with greater than 50%  respiratory variability, suggesting right atrial pressure of 3 mmHg.   Left heart cath on October 15, 2018: Prox LAD lesion is 95% stenosed. A drug-eluting stent was successfully placed using a STENT SYNERGY DES 4X20. Post intervention, there is a 0% residual stenosis. LV end diastolic pressure is mildly elevated. LVEDP 25 mm Hg. There is no aortic valve stenosis. OCT used post stent which showed excellent stent apposition and stent expansion. No edge  dissection.   Dual antiplatelet therapy for at least one year.  He needs aggressive secondary prevention including weight loss.   Recent Labs: 04/19/2022: B Natriuretic Peptide 52.9 06/06/2022: TSH 3.787 06/09/2022: ALT 53; BUN 8; Creatinine, Ser 1.17; Hemoglobin 13.2; Magnesium 1.4; Platelets 106; Potassium 3.5; Sodium 137  Recent Lipid Panel    Component Value Date/Time   CHOL 290 (H) 06/06/2022 1620   TRIG 75 06/06/2022 1620   HDL 98 06/06/2022 1620   CHOLHDL 3.0 06/06/2022 1620   VLDL 15 06/06/2022 1620   LDLCALC 177 (H) 06/06/2022 1620     Risk Assessment/Calculations:    CHA2DS2-VASc Score = 2  This indicates a 2.2% annual risk of stroke. The patient's score is based upon: CHF History: 0 HTN History: 1 Diabetes History: 0 Stroke History: 0 Vascular Disease History: 1 Age Score: 0 Gender Score: 0    Physical Exam:    VS:  BP 132/76   Pulse 88   Ht '6\' 3"'  (1.905 m)   Wt (!) 343 lb (155.6 kg)   SpO2 98%   BMI 42.87 kg/m     Wt Readings from Last 3 Encounters:  06/28/22 (!) 343 lb (155.6 kg)  06/09/22 (!) 340 lb 3.2 oz (154.3 kg)  05/12/22 (!) 378 lb 3.2 oz (171.6 kg)     GEN: Morbidly obese, 41 y.o. Caucasian male in no acute distress HEENT: Normal NECK: No JVD; No carotid bruits LYMPHATICS: No lymphadenopathy CARDIAC: S1/S2, RRR, no murmurs, rubs, gallops; 2+ peripheral pulses along radial sites, 1+ peripheral pulses along posterior tibial sites, equal bilaterally.  Chronic venous stasis along BLE. 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  NEUROLOGIC:  Alert and oriented x 3 PSYCHIATRIC:  Normal affect   ASSESSMENT:    1. Coronary artery disease involving native coronary artery of native heart without angina pectoris   2. Hyperlipidemia, unspecified hyperlipidemia type   3. PAF (paroxysmal atrial fibrillation) ([place])   4. Essential hypertension   5. History of thrombocytopenia   6. Bruising   7. Depression, unspecified depression type   8. Fatigue, unspecified type   9. Morbid obesity ([place])    PLAN:    In order of problems listed above:  Coronary artery disease, status post drug-eluting stent to LAD in 2020, hyperlipidemia Stable with no anginal symptoms. No indication for ischemic evaluation.  Unable to tolerate statins.  Last LDL 177.  Lipid clinic referral placed during hospitalization.  Continue Plavix and nitroglycerin as needed.  We will obtain CMET today.  Paroxysmal atrial fibrillation 2 episodes of palpitations, lasted 1 second each, seem to be respiratory related as coughing helped this to go away.  Twelve-lead EKG today reveals NSR, 84 bpm, defers monitor at this time, requesting today to obtain the following blood work: CBC, CMET, magnesium, TSH.  Continue Cardizem and will route this note to Dr. Irish Lack to determine [place] status.  Hypertension BP today, 132/76.  Does not log BP at home.  Discussed lifestyle  changes and obtaining Omron cuff. Discussed to monitor BP at home at least 2 hours after medications daily  and sitting for 5-10 minutes.  Continue current medication regimen.  We will obtain CMET today.  He will bring BP log to next follow-up visit.  History of thrombocytopenia, bruising Chronic thrombocytopenia, recent worsening in bruising, blood boils noted along left arm.  We will place hematology referral today.  Will obtain CBC today.  Discussed with patient to stop using Goody's for history of headaches.  Depression Does admit to recent depression.  Denies any suicidal ideation or homicidal ideation.  Patient and wife are requesting a more immediate referral, as current therapy follow-up visit is several weeks away.  We will place referral for therapy at [facility] location.   Fatigue Does admit to recent fatigue.  Etiology multifactorial.  Echo in July 2023 revealed LVEF 66 5%, no RWMA.  We will obtain CBC and TSH today.  Placing referral for therapy at [facility] location.  Morbid obesity Weight loss via diet and exercise encouraged. Discussed the impact being overweight would have on cardiovascular risk.  8.  Disposition: Follow-up in 4 to 6 weeks with me, Richardson Dopp, PA-C, or another APP or sooner if anything changes.  Medication Adjustments/Labs and Tests Ordered: Current medicines are reviewed at length with the patient today.  Concerns regarding medicines are outlined above.  Orders Placed This Encounter  Procedures   CBC   TSH   Comp Met (CMET)   Magnesium   Ambulatory referral to Hematology / Oncology   EKG 12-Lead   Meds ordered this encounter  Medications   diltiazem (CARDIZEM CD) 240 MG 24 hr capsule    Sig: Take 1 capsule (240 mg total) by mouth daily.    Dispense:  90 capsule    Refill:  3    Patient Instructions  Medication Instructions:  Your physician recommends that you continue on your current medications as directed. Please refer to the Current Medication list given to you today.  *If you need a refill on your cardiac medications before your next  appointment, please call your pharmacy*   Lab Work: TODAY:  CBC, TSH, CMET, & MAG  If you have labs (blood work) drawn today and your tests are completely normal, you will receive your results only by: [place] (if you have MyChart) OR A paper copy in the mail If you have any lab test that is abnormal or we need to change your treatment, we will call you to review the results.   Testing/Procedures: None ordered   You have been referred to HEMATOLOGY.  They will call you with an appointment.    Follow-Up: At [facility], you and your health needs are our priority.  As part of our continuing mission to provide you with exceptional heart care, we have created designated Provider Care Teams.  These Care Teams include your primary Cardiologist (physician) and Advanced Practice Providers (APPs -  Physician Assistants and Nurse Practitioners) who all work together to provide you with the care you need, when you need it.  We recommend signing up for the patient portal called "MyChart".  Sign up information is provided on this After Visit Summary.  MyChart is used to connect with patients for Virtual Visits (Telemedicine).  Patients are able to view lab/test results, encounter notes, upcoming appointments, etc.  Non-urgent messages can be sent to your provider as well.   To learn more about what you can do with MyChart, go to [email or web address].  Your next appointment:   4-6 WEEKS  08-07-22 arrive at 11:05  The format for your next appointment:   In Person  Provider:   Richardson Dopp, PA-C         Other Instructions   Important Information About Sugar         Signed, Finis Bud, NP  06/28/2022 12:56 PM    [place]

## 2022-06-28 NOTE — Patient Instructions (Addendum)
Medication Instructions:  Your physician recommends that you continue on your current medications as directed. Please refer to the Current Medication list given to you today.  *If you need a refill on your cardiac medications before your next appointment, please call your pharmacy*   Lab Work: TODAY:  CBC, TSH, CMET, & MAG  If you have labs (blood work) drawn today and your tests are completely normal, you will receive your results only by: [place] (if you have MyChart) OR A paper copy in the mail If you have any lab test that is abnormal or we need to change your treatment, we will call you to review the results.   Testing/Procedures: None ordered   You have been referred to HEMATOLOGY.  They will call you with an appointment.    Follow-Up: At [facility], you and your health needs are our priority.  As part of our continuing mission to provide you with exceptional heart care, we have created designated Provider Care Teams.  These Care Teams include your primary Cardiologist (physician) and Advanced Practice Providers (APPs -  Physician Assistants and Nurse Practitioners) who all work together to provide you with the care you need, when you need it.  We recommend signing up for the patient portal called "MyChart".  Sign up information is provided on this After Visit Summary.  MyChart is used to connect with patients for Virtual Visits (Telemedicine).  Patients are able to view lab/test results, encounter notes, upcoming appointments, etc.  Non-urgent messages can be sent to your provider as well.   To learn more about what you can do with MyChart, go to [email or web address].    Your next appointment:   4-6 WEEKS  08-07-22 arrive at 11:05  The format for your next appointment:   In Person  Provider:   Richardson Dopp, PA-C         Other Instructions   Important Information About Sugar

## 2022-06-28 NOTE — Addendum Note (Signed)
Addended by: Finis Bud on: 06/28/2022 03:29 PM   Modules accepted: Orders

## 2022-06-29 ENCOUNTER — Telehealth: Payer: Self-pay | Admitting: Nurse Practitioner

## 2022-06-29 ENCOUNTER — Other Ambulatory Visit: Payer: Self-pay | Admitting: Nurse Practitioner

## 2022-06-29 DIAGNOSIS — I2510 Atherosclerotic heart disease of native coronary artery without angina pectoris: Secondary | ICD-10-CM

## 2022-06-29 LAB — COMPREHENSIVE METABOLIC PANEL
ALT: 53 IU/L — ABNORMAL HIGH (ref 0–44)
AST: 64 IU/L — ABNORMAL HIGH (ref 0–40)
Albumin/Globulin Ratio: 1.3 (ref 1.2–2.2)
Albumin: 4.3 g/dL (ref 4.1–5.1)
Alkaline Phosphatase: 138 IU/L — ABNORMAL HIGH (ref 44–121)
BUN/Creatinine Ratio: 19 (ref 9–20)
BUN: 15 mg/dL (ref 6–24)
Bilirubin Total: 0.5 mg/dL (ref 0.0–1.2)
CO2: 29 mmol/L (ref 20–29)
Calcium: 10 mg/dL (ref 8.7–10.2)
Chloride: 96 mmol/L (ref 96–106)
Creatinine, Ser: 0.78 mg/dL (ref 0.76–1.27)
Globulin, Total: 3.4 g/dL (ref 1.5–4.5)
Glucose: 100 mg/dL — ABNORMAL HIGH (ref 70–99)
Potassium: 4.1 mmol/L (ref 3.5–5.2)
Sodium: 140 mmol/L (ref 134–144)
Total Protein: 7.7 g/dL (ref 6.0–8.5)
eGFR: 113 mL/min/{1.73_m2} (ref >59)

## 2022-06-29 LAB — CBC
Hematocrit: 45.4 % (ref 37.5–51.0)
Hemoglobin: 15.2 g/dL (ref 13.0–17.7)
MCH: 34.1 pg — ABNORMAL HIGH (ref 26.6–33.0)
MCHC: 33.5 g/dL (ref 31.5–35.7)
MCV: 102 fL — ABNORMAL HIGH (ref 79–97)
Platelets: 212 10*3/uL (ref 150–450)
RBC: 4.46 x10E6/uL (ref 4.14–5.80)
RDW: 12.9 % (ref 11.6–15.4)
WBC: 10.4 10*3/uL (ref 3.4–10.8)

## 2022-06-29 LAB — MAGNESIUM: Magnesium: 1.7 mg/dL (ref 1.6–2.3)

## 2022-06-29 LAB — TSH: TSH: 1.45 u[IU]/mL (ref 0.450–4.500)

## 2022-06-29 MED ORDER — ASPIRIN 81 MG PO TBEC: 81.0000 mg | DELAYED_RELEASE_TABLET | Freq: Every day | ORAL | 2 refills | Status: DC

## 2022-06-29 NOTE — Progress Notes (Signed)
S/w Dr. Irish Lack who confirms that patient should not start [place] at this time and ASA 81 mg daily can be added to Plavix regimen. Have sent this electronic Rx to patient's pharmacy.   Finis Bud, NP

## 2022-06-29 NOTE — Addendum Note (Signed)
Addended by: Finis Bud on: 06/29/2022 01:07 PM   Modules accepted: Orders

## 2022-06-29 NOTE — Telephone Encounter (Signed)
S/W patient and updated him regarding his lab results.  Improvement in CBC with normalization of platelets.  Dr. Irish Lack stated patient may not really benefit from hematology referral.  Discussed this with patient and he is agreeable to cancel hematology referral.  Discussed with patient to avoid Goody's and continue alcohol abstinence.  Have routed a response to Dr. Irish Lack and am waiting for response regarding DOAC and aspirin therapy.  I discussed with patient that I will update him after I hear back from Dr. Irish Lack regarding this.  He verbalized understanding and was appreciative of my call.  Finis Bud, NP

## 2022-07-03 ENCOUNTER — Emergency Department (HOSPITAL_COMMUNITY)
Admission: EM | Admit: 2022-07-03 | Discharge: 2022-07-03 | Disposition: A | Discharge status: Home / Self Care | Payer: Managed Care, Other (non HMO) | Attending: Emergency Medicine | Admitting: Emergency Medicine

## 2022-07-03 DIAGNOSIS — J45909 Unspecified asthma, uncomplicated: Secondary | ICD-10-CM | POA: Diagnosis not present

## 2022-07-03 DIAGNOSIS — S80811A Abrasion, right lower leg, initial encounter: Secondary | ICD-10-CM | POA: Insufficient documentation

## 2022-07-03 DIAGNOSIS — I2510 Atherosclerotic heart disease of native coronary artery without angina pectoris: Secondary | ICD-10-CM | POA: Insufficient documentation

## 2022-07-03 DIAGNOSIS — Z79899 Other long term (current) drug therapy: Secondary | ICD-10-CM | POA: Insufficient documentation

## 2022-07-03 DIAGNOSIS — F10129 Alcohol abuse with intoxication, unspecified: Secondary | ICD-10-CM | POA: Diagnosis not present

## 2022-07-03 DIAGNOSIS — S4991XA Unspecified injury of right shoulder and upper arm, initial encounter: Secondary | ICD-10-CM | POA: Diagnosis present

## 2022-07-03 DIAGNOSIS — Y909 Presence of alcohol in blood, level not specified: Secondary | ICD-10-CM | POA: Insufficient documentation

## 2022-07-03 DIAGNOSIS — I10 Essential (primary) hypertension: Secondary | ICD-10-CM | POA: Insufficient documentation

## 2022-07-03 DIAGNOSIS — S80812A Abrasion, left lower leg, initial encounter: Secondary | ICD-10-CM | POA: Diagnosis not present

## 2022-07-03 DIAGNOSIS — S40812A Abrasion of left upper arm, initial encounter: Secondary | ICD-10-CM | POA: Insufficient documentation

## 2022-07-03 DIAGNOSIS — T07XXXA Unspecified multiple injuries, initial encounter: Secondary | ICD-10-CM

## 2022-07-03 DIAGNOSIS — S40811A Abrasion of right upper arm, initial encounter: Secondary | ICD-10-CM | POA: Diagnosis not present

## 2022-07-03 DIAGNOSIS — Z7902 Long term (current) use of antithrombotics/antiplatelets: Secondary | ICD-10-CM | POA: Insufficient documentation

## 2022-07-03 DIAGNOSIS — F17200 Nicotine dependence, unspecified, uncomplicated: Secondary | ICD-10-CM | POA: Insufficient documentation

## 2022-07-03 DIAGNOSIS — W108XXA Fall (on) (from) other stairs and steps, initial encounter: Secondary | ICD-10-CM | POA: Diagnosis not present

## 2022-07-03 DIAGNOSIS — F32A Depression, unspecified: Secondary | ICD-10-CM | POA: Diagnosis not present

## 2022-07-03 NOTE — ED Provider Notes (Signed)
[facility] EMERGENCY DEPARTMENT Provider Note   CSN: [id number] Arrival date & time: 07/03/22  1846     History  Chief Complaint  Patient presents with   Hypertension   Headache   Fall    Pauline Trainer is a 41 y.o. male.  HPI     41yo male with history of alcohol dependence and withdrawal, atrial fibrillation not on anticoagulation given alcohol use disorder/fall risk, hypertension, opiate dependence, CAD, hyperlipidemia, psoriatic arthritis, who presents with concern for elevated blood pressure, alcohol intoxication.  He reports he was not feeling well today and went to his doctor.  When I asked in what way he was not feeling well he states he was depressed.  He is feeling badly about his current job in comparison to prior and his battle with alcohol dependence. Has significant family history of alcoholism. He reports when he began to feel depressed he started drinking more this AM.  He had a headache, reports it is not bad.  His blood pressures were high when he was seen by provider this afternoon and due to his high blood pressures and headache they recommended he come here. He had a fall up the stairs while he was intoxicated, reopening some sounds to his legs, causing other leg wounds. He denies any head trauma. Called a friend who had witnessed the fall who also reports he did not hit his head at all.  Not having neck pain, Denies numbness, weakness, difficulty talking or walking, visual changes or facial droop.  Denies chest pain, dyspnea.  Is asking to go home now that he has been in the ED and his blood pressures have improved. He was worried his blood pressures were too high after stopping medications during his recent afib admission.  He is not on anticoagulation.   He is planning on going to [facility] on Wednesday.  Carries a photo of his son with hm and says he is the motivation for him to get better.    Past Medical History:  Diagnosis Date   AKI  (acute kidney injury) (HCC)    Anxiety and depression    Arthritis    Asthma    Bronchitis    CAD (coronary artery disease)    a. 10/15/2018 DES to prox LAD   GERD (gastroesophageal reflux disease)    History of atrial fibrillation    During alcohol binge   Hyperlipidemia with target LDL less than 70    Hypertension    Tobacco abuse      Home Medications Prior to Admission medications   Medication Sig Start Date End Date Taking? Authorizing Provider  acetaminophen (TYLENOL) 500 MG tablet Take 1,000 mg by mouth every 4 (four) hours as needed (breakthrough pain).    [provider]  albuterol (PROVENTIL) (2.5 MG/3ML) 0.083% nebulizer solution Take 3 mLs (2.5 mg total) by nebulization every 4 (four) hours as needed for wheezing or shortness of breath. 09/28/17   [name], MD  albuterol (VENTOLIN HFA) 108 (90 Base) MCG/ACT inhaler Inhale 2 puffs into the lungs every 6 (six) hours as needed for wheezing or shortness of breath. 05/18/22   [provider]  ALPRAZolam Prudy Feeler) 1 MG tablet Take 1 mg by mouth 3 (three) times daily as needed for anxiety. 05/29/22   [provider]  aspirin EC 81 MG tablet Take 1 tablet (81 mg total) by mouth daily. Swallow whole. 06/29/22 06/29/23  Sharlene Dory, NP  ciprofloxacin (CIPRO) 250 MG tablet Take 250  mg by mouth 2 (two) times daily. 06/15/22   [provider]  clobetasol cream (TEMOVATE) 0.05 % Apply 1 Application topically 2 (two) times daily as needed (For psoriasis).    [provider]  Clobetasol Propionate (TEMOVATE) 0.05 % external spray Apply 1 application  topically 2 (two) times daily as needed (For psoriasis). 08/12/18   [provider]  clopidogrel (PLAVIX) 75 MG tablet Take 75 mg by mouth daily.    [provider]  COSENTYX SENSOREADY, 300 MG, 150 MG/ML SOAJ Inject 300 mg into the skin every 30 (thirty) days. 07/29/18   [provider]  diclofenac Sodium (VOLTAREN) 1 %  GEL Apply 2 g topically 4 (four) times daily as needed (arthritis, pain).    [provider]  diltiazem (CARDIZEM CD) 240 MG 24 hr capsule Take 1 capsule (240 mg total) by mouth daily. 06/28/22   Sharlene Dory, NP  docusate sodium (STOOL SOFTENER) 100 MG capsule Take 100 mg by mouth 2 (two) times daily.    [provider]  ibuprofen (ADVIL) 200 MG tablet Take 800 mg by mouth every 4 (four) hours as needed (breakthrough pain).    [provider]  lamoTRIgine (LAMICTAL) 200 MG tablet Take 200 mg by mouth 2 (two) times daily. 04/03/22   [provider]  magnesium hydroxide (MILK OF MAGNESIA) 400 MG/5ML suspension Take 30 mLs by mouth daily as needed for mild constipation.    [provider]  Menthol, Topical Analgesic, (ICY HOT) 7.5 % (Roll) MISC Apply 1 each topically daily as needed (arthritis, pain).    [provider]  Multiple Vitamin (MULTIVITAMIN WITH MINERALS) TABS tablet Take 1 tablet by mouth daily.    [provider]  nitroGLYCERIN (NITROSTAT) 0.4 MG SL tablet Place 0.4 mg under the tongue every 5 (five) minutes x 3 doses as needed. If no improvement, call 911 08/08/18   [provider]  ondansetron (ZOFRAN-ODT) 8 MG disintegrating tablet Take 1 tablet (8 mg total) by mouth every 8 (eight) hours as needed for nausea or vomiting. 08/14/18   [name], [name], PA-C  oxyCODONE 10 MG TABS Take 1 tablet (10 mg total) by mouth 2 (two) times daily as needed (pain). 06/09/22   Jerald Kief, MD  pantoprazole (PROTONIX) 40 MG tablet Take 40 mg by mouth daily as needed (heartburn).    [provider]  potassium chloride SA (KLOR-CON M) 20 MEQ tablet Take 20 mEq by mouth daily. 05/04/22   [provider]  torsemide (DEMADEX) 20 MG tablet Take 40 mg by mouth 2 (two) times daily. 05/12/22   [provider]  traZODone (DESYREL) 150 MG tablet Take 150 mg by mouth at bedtime.    [provider]      Allergies     Statins    Review of Systems   Review of Systems  Physical Exam Updated Vital Signs BP 123/72   Pulse 95   Temp 98.2 F (36.8 C) (Oral)   Resp 20   SpO2 96%  Physical Exam Vitals and nursing note reviewed.  Constitutional:      General: He is not in acute distress.    Appearance: Normal appearance. He is well-developed. He is not ill-appearing or diaphoretic.  HENT:     Head: Normocephalic and atraumatic.  Eyes:     General: No visual field deficit.    Extraocular Movements: Extraocular movements intact.     Conjunctiva/sclera: Conjunctivae normal.     Pupils: Pupils are  equal, round, and reactive to light.  Cardiovascular:     Rate and Rhythm: Normal rate and regular rhythm.     Pulses: Normal pulses.     Heart sounds: Normal heart sounds. No murmur heard.    No friction rub. No gallop.  Pulmonary:     Effort: Pulmonary effort is normal. No respiratory distress.     Breath sounds: Normal breath sounds. No wheezing or rales.  Abdominal:     General: There is no distension.     Palpations: Abdomen is soft.     Tenderness: There is no abdominal tenderness. There is no guarding.  Musculoskeletal:        General: No swelling or tenderness.     Cervical back: Normal range of motion.     Comments: No c/t/l spine tenderness No wrist tenderness or snuff box tenderness  Skin:    General: Skin is warm and dry.     Findings: No erythema or rash.     Comments: Scattered abrasions bilateral arms, bilateral lower legs    Neurological:     General: No focal deficit present.     Mental Status: He is alert and oriented to person, place, and time.     GCS: GCS eye subscore is 4. GCS verbal subscore is 5. GCS motor subscore is 6.     Cranial Nerves: No cranial nerve deficit, dysarthria or facial asymmetry.     Sensory: No sensory deficit.     Motor: No weakness or tremor.     Coordination: Coordination normal. Finger-Nose-Finger Test normal.     Gait: Gait normal.     ED  Results / Procedures / Treatments   Labs (all labs ordered are listed, but only abnormal results are displayed) Labs Reviewed - No data to display  EKG None  Radiology No results found.  Procedures Procedures    Medications Ordered in ED Medications - No data to display  ED Course/ Medical Decision Making/ A&P                            41yo male with history of alcohol dependence and withdrawal, atrial fibrillation not on anticoagulation given alcohol use disorder/fall risk, hypertension, opiate dependence, CAD, hyperlipidemia, psoriatic arthritis, who presents with concern for elevated blood pressure, alcohol intoxication.  Headache: offered CT head to patient but he declines. He was intoxicated but at this time is clinically sober and do feel he has capacity to make this decision in the current clinical scenario. He deneis head trauma and called a friend who als reports he did not witness any head trauma with his fall.  He has no neurologic symptoms or findings on exam.  Headache was not sudden onset/worst in his life and clinically doubt SAH.  No fever, doubt meningitis.  Reportedly, his blood pressures were higher at home-on arrival 150/108 but improved to 123/72. Do not suspect hypertensive encephalopathy and low clinical suspicion for hypertensive ICH.    Htn: improved  Fall-has multiple abrasions and older wounds that were cleaned and bandaged, do not see acute laceration requiring repair.  Do not see other signs of trauma related to fall, ambulatory with steady gait at this time and requesting discharge  Reports depression but denies SI. Will be going to [facility] this Wednesday. Patient discharged in stable condition with understanding of reasons to return.         Final Clinical Impression(s) / ED Diagnoses Final diagnoses:  Alcohol abuse with intoxication ([place])  Multiple abrasions    Rx / DC Orders ED Discharge Orders     None         Gareth Morgan, MD 07/04/22 [phone]

## 2022-07-03 NOTE — ED Notes (Signed)
Pt repeatedly endorsing headache, but stating that he wants to leave. Pt repeatedly taking off bandages on wounds on arms causing bleeding. Pt informed MD will be in to see him shortly and to not get out of bed as he is a fall risk.

## 2022-07-03 NOTE — ED Triage Notes (Addendum)
Pt arrived via [facility] for fall. hypertension, headache, ETOH. Pt was seen by nurse today at 4:30 pm, noted to be hypertensive with headache at that time. Pt reports recent changes in blood pressure medication. When he arrived home, he fell up the stairs reopening old wounds on legs and arms from previous falls. Denies LOC, did not hit head, no blood thinners. Pt reports "6 shots" today. Slurred speech noted, unsteady gait. GCS 14, A&Ox4.  BP 168/92 HR 104 SPO2 95 RR 20 CBG 108

## 2022-07-18 ENCOUNTER — Ambulatory Visit: Payer: Managed Care, Other (non HMO) | Admitting: Gastroenterology

## 2022-07-19 ENCOUNTER — Ambulatory Visit (INDEPENDENT_AMBULATORY_CARE_PROVIDER_SITE_OTHER): Payer: 59 | Admitting: Psychologist

## 2022-07-19 DIAGNOSIS — F1010 Alcohol abuse, uncomplicated: Secondary | ICD-10-CM | POA: Diagnosis not present

## 2022-07-19 DIAGNOSIS — F331 Major depressive disorder, recurrent, moderate: Secondary | ICD-10-CM | POA: Diagnosis not present

## 2022-07-19 NOTE — Progress Notes (Signed)
                Dora Jovanie Verge, PsyD 
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
## 2022-07-19 NOTE — Plan of Care (Signed)

## 2022-07-20 ENCOUNTER — Ambulatory Visit: Payer: Managed Care, Other (non HMO)

## 2022-07-20 ENCOUNTER — Ambulatory Visit: Payer: Self-pay | Admitting: Family Medicine

## 2022-08-06 NOTE — Progress Notes (Deleted)
Cardiology Office Note:    Date:  08/06/2022   ID:  Scott Waller, DOB 02/15/1981, MRN [id number]  PCP:  April Manson, NP   [facility] Providers Cardiologist:  Lance Muss, MD     Referring MD: April Manson, NP   CC: Here for hospital f/u  History of Present Illness:    Scott Waller is a 41 y.o. male with a hx of the following:  CAD, s/p DES to proximal LAD in 2020 *** HTN HLD PAF AKI GAD Polysubstance abuse Morbid obesity Moderate to severe hepatic steatosis with fatty liver GERD  Presented to the hospital on June 07, 2022 with chief complaint of palpitations.  He has stated in the ED that he and his wife decided to stop drinking alcohol and stop pain medication.  Presented to behavioral health the day before with suicidal ideation, therefore involuntary committed. He stated in the ED he did this so he could get admitted to the hospital and get the help he needed to get off opiates and alcohol.  He was in the process of being transported from urgent care to behavioral health Hospital when [facility] police noted that patient was reporting that his heart felt like it was fluttering.  He has stated he has similar symptoms around 12 years ago and was on Cardizem for a period of time before being switched to a different medication.  Also reported some left-sided chest cramping, dry heaves, and had some nausea. Chest x-ray negative.  UDS notable for oxycodone, marijuana, and benzodiazepines.  EKG showed A-fib with RVR, 120 bpm, received Cardizem drip and spontaneously converted to NSR, transition to oral diltiazem, despite CHA2DS2-VASc score 2, he was found not to be a candidate for anticoagulation due to history of alcohol detox, plan to reconsider DOAC in outpatient setting.  Was referred to lipid clinic for consideration of Repatha/Praluent.  Today he presents for hospital follow-up with his wife.  Denies any alcohol use or illicit drug use recently.  Denies  any chest pain, but has chronic intermittent shortness of breath, attributes this to his history of asthma as well as anxiety.  Does admit to recent fatigue and wife who is present in the room reports he has had significant bruising that has worsened recently, also notes blood boils on his skin.  Has had palpitations maybe twice since hospitalization, says he takes a deep breath and coughs and then this helps, said it only lasted a second.  Denies any hemoptysis, melena, hematuria, syncope, presyncope, dizziness, claudication, orthopnea, or PND.  Wife is requesting if there are any resources for earlier therapy referral, since it will be a while before his appointment with therapy.    Past Surgical History:  Procedure Laterality Date   APPENDECTOMY     CORONARY STENT INTERVENTION N/A 10/15/2018   Procedure: CORONARY STENT INTERVENTION;  Surgeon: Corky Crafts, MD;  Location: [facility] INVASIVE CV LAB;  Service: Cardiovascular;  Laterality: N/A;   INTRAVASCULAR ULTRASOUND/IVUS N/A 10/15/2018   Procedure: Intravascular Ultrasound/IVUS;  Surgeon: Corky Crafts, MD;  Location: [facility] INVASIVE CV LAB;  Service: Cardiovascular;  Laterality: N/A;   LEFT HEART CATH AND CORONARY ANGIOGRAPHY N/A 10/15/2018   Procedure: LEFT HEART CATH AND CORONARY ANGIOGRAPHY;  Surgeon: Corky Crafts, MD;  Location: [facility] INVASIVE CV LAB;  Service: Cardiovascular;  Laterality: N/A;    Current Medications: No outpatient medications have been marked as taking for the 08/07/22 encounter (Appointment) with Tereso Newcomer T, PA-C.     Allergies:  Statins   Social History   Socioeconomic History   Marital status: Married    Spouse name: Not on file   Number of children: Not on file   Years of education: Not on file   Highest education level: Not on file  Occupational History   Not on file  Tobacco Use   Smoking status: Every Day    Packs/day: 1.00    Years: 25.00    Total pack years: 25.00    Types: Cigarettes    Smokeless tobacco: Never  Vaping Use   Vaping Use: Never used  Substance and Sexual Activity   Alcohol use: Yes    Alcohol/week: 20.0 - 24.0 standard drinks of alcohol    Types: 20 - 24 Cans of beer per week   Drug use: No   Sexual activity: Yes    Partners: Female  Other Topics Concern   Not on file  Social History Narrative   Not on file   Social Determinants of Health   Financial Resource Strain: Not on file  Food Insecurity: No Food Insecurity (06/08/2022)   Hunger Vital Sign    Worried About Running Out of Food in the Last Year: Never true    Ran Out of Food in the Last Year: Never true  Transportation Needs: No Transportation Needs (06/08/2022)   PRAPARE - Administrator, Civil Service (Medical): No    Lack of Transportation (Non-Medical): No  Physical Activity: Not on file  Stress: Not on file  Social Connections: Not on file     Family History: The patient's family history includes Hypertension in his father.  ROS:   Review of Systems  Constitutional:  Positive for malaise/fatigue. Negative for chills, diaphoresis, fever and weight loss.  HENT: Negative.    Eyes: Negative.   Respiratory:  Positive for cough and shortness of breath. Negative for hemoptysis, sputum production and wheezing.        History of asthma.  See HPI.  Cardiovascular:  Positive for palpitations and leg swelling. Negative for chest pain, orthopnea, claudication and PND.       Improved, chronic BLE.  Gastrointestinal: Negative.   Genitourinary: Negative.   Musculoskeletal: Negative.        [place].  Skin:        Chronic skin discoloration along BLE.  Skin tears/venous ulcers along BLE, with recent bruising, blood boils along BUE.  Neurological: Negative.   Endo/Heme/Allergies:  Negative for environmental allergies and polydipsia. Bruises/bleeds easily.  Psychiatric/Behavioral:  Positive for depression. Negative for  hallucinations, memory loss, substance abuse and suicidal ideas. The patient is nervous/anxious. The patient does not have insomnia.     Please see the history of present illness.    All other systems reviewed and are negative.  EKGs/Labs/Other Studies Reviewed:    The following studies were reviewed today:   EKG:  EKG is ordered today.  The ekg ordered today demonstrates normal sinus rhythm, 84 bpm, right axis deviation, pulmonary pattern, nonspecific ST segment changes, otherwise nothing acute.  2D echocardiogram on April 21, 2022:  1. Left ventricular ejection fraction, by estimation, is 60 to 65%. The  left ventricle has normal function. The left ventricle has no regional  wall motion abnormalities. Left ventricular diastolic parameters were  normal.   2. Right ventricular systolic function is normal. The right ventricular  size is normal.   3. The mitral valve is normal in structure. No  evidence of mitral valve  regurgitation. No evidence of mitral stenosis.   4. The aortic valve was not well visualized. There is mild calcification  of the aortic valve. Aortic valve regurgitation is not visualized. Aortic  valve sclerosis is present, with no evidence of aortic valve stenosis.   5. The inferior vena cava is normal in size with greater than 50%  respiratory variability, suggesting right atrial pressure of 3 mmHg.   Left heart cath on October 15, 2018: Prox LAD lesion is 95% stenosed. A drug-eluting stent was successfully placed using a STENT SYNERGY DES 4X20. Post intervention, there is a 0% residual stenosis. LV end diastolic pressure is mildly elevated. LVEDP 25 mm Hg. There is no aortic valve stenosis. OCT used post stent which showed excellent stent apposition and stent expansion. No edge dissection.   Dual antiplatelet therapy for at least one year.  He needs aggressive secondary prevention including weight loss.   Recent Labs: 04/19/2022: B Natriuretic Peptide  52.9 06/28/2022: ALT 53; BUN 15; Creatinine, Ser 0.78; Hemoglobin 15.2; Magnesium 1.7; Platelets 212; Potassium 4.1; Sodium 140; TSH 1.450  Recent Lipid Panel    Component Value Date/Time   CHOL 290 (H) 06/06/2022 1620   TRIG 75 06/06/2022 1620   HDL 98 06/06/2022 1620   CHOLHDL 3.0 06/06/2022 1620   VLDL 15 06/06/2022 1620   LDLCALC 177 (H) 06/06/2022 1620     Risk Assessment/Calculations:    CHA2DS2-VASc Score = 2  This indicates a 2.2% annual risk of stroke. The patient's score is based upon: CHF History: 0 HTN History: 1 Diabetes History: 0 Stroke History: 0 Vascular Disease History: 1 Age Score: 0 Gender Score: 0    Physical Exam:    VS:  There were no vitals taken for this visit.    Wt Readings from Last 3 Encounters:  06/28/22 (!) 343 lb (155.6 kg)  06/09/22 (!) 340 lb 3.2 oz (154.3 kg)  05/12/22 (!) 378 lb 3.2 oz (171.6 kg)     GEN: Morbidly obese, 41 y.o. Caucasian male in no acute distress HEENT: Normal NECK: No JVD; No carotid bruits LYMPHATICS: No lymphadenopathy CARDIAC: S1/S2, RRR, no murmurs, rubs, gallops; 2+ peripheral pulses along radial sites, 1+ peripheral pulses along posterior tibial sites, equal bilaterally.  Chronic venous stasis along BLE. RESPIRATORY:  Clear and diminished to auscultation without rales, wheezing or rhonchi  MUSCULOSKELETAL: +2 pitting edema along right lower extremity, +1 pitting edema along left lower extremity; No deformity  SKIN: Scattered bruising along BUE with large bruise noted on left anterior upper arm, scattered blood boils along left arm, chronic venous stasis ulcer along right lower extremity, venous ulcer LLE (unable to stage as this has black eschar) with skin tear along LLE that contains red/yellow drainage noted in wound bed, no erythema around wound, other healing skin tear along LLE, overall warm and dry.  NEUROLOGIC:  Alert and oriented x 3 PSYCHIATRIC:  Normal affect   ASSESSMENT:    No diagnosis  found.  PLAN:    In order of problems listed above:  Coronary artery disease, status post drug-eluting stent to LAD in 2020, hyperlipidemia Stable with no anginal symptoms. No indication for ischemic evaluation.  Unable to tolerate statins.  Last LDL 177.  Lipid clinic referral placed during hospitalization.  Continue Plavix and nitroglycerin as needed.  We will obtain CMET today.  Paroxysmal atrial fibrillation 2 episodes of palpitations, lasted 1 second each, seem to be respiratory related as coughing helped this to go away.  Twelve-lead EKG today reveals NSR, 84 bpm, defers monitor at this time, requesting today to obtain the following blood work: CBC, CMET, magnesium, TSH.  Continue Cardizem and will route this note to Dr. Irish Lack to determine [place] status.  Hypertension BP today, 132/76.  Does not log BP at home.  Discussed lifestyle changes and obtaining Omron cuff. Discussed to monitor BP at home at least 2 hours after medications daily and sitting for 5-10 minutes.  Continue current medication regimen.  We will obtain CMET today.  He will bring BP log to next follow-up visit.  History of thrombocytopenia, bruising Chronic thrombocytopenia, recent worsening in bruising, blood boils noted along left arm.  We will place hematology referral today.  Will obtain CBC today.  Discussed with patient to stop using Goody's for history of headaches.  Addendum 06/29/22: Patient's platelet level and H&H have improved and are WNL. Discussed this case patient's primary cardiologist, Dr. Irish Lack, who agrees to cancel Hematology referral, pt updated and is okay with canceling referral to Hematology.   Depression Does admit to recent depression.  Denies any suicidal ideation or homicidal ideation.  Patient and wife are requesting a more immediate referral, as current therapy follow-up visit is several weeks away.  We will place referral for therapy at [facility] location.   Fatigue Does admit to recent  fatigue.  Etiology multifactorial.  Echo in July 2023 revealed LVEF 66 5%, no RWMA.  We will obtain CBC and TSH today.  Placing referral for therapy at [facility] location.  Morbid obesity Weight loss via diet and exercise encouraged. Discussed the impact being overweight would have on cardiovascular risk.  8.  Disposition: Follow-up in 4 to 6 weeks with me, Richardson Dopp, PA-C, or another APP or sooner if anything changes.  Medication Adjustments/Labs and Tests Ordered: Current medicines are reviewed at length with the patient today.  Concerns regarding medicines are outlined above.  No orders of the defined types were placed in this encounter.  No orders of the defined types were placed in this encounter.   There are no Patient Instructions on file for this visit.   Signed, Finis Bud, NP  08/06/2022 8:43 PM    [place]

## 2022-08-07 ENCOUNTER — Ambulatory Visit: Payer: Managed Care, Other (non HMO) | Attending: Physician Assistant | Admitting: Physician Assistant

## 2022-08-14 ENCOUNTER — Ambulatory Visit: Payer: 59 | Admitting: Psychologist

## 2022-08-14 ENCOUNTER — Ambulatory Visit: Payer: Managed Care, Other (non HMO) | Admitting: Family Medicine

## 2022-08-29 ENCOUNTER — Other Ambulatory Visit (HOSPITAL_COMMUNITY): Payer: Self-pay

## 2022-10-24 ENCOUNTER — Other Ambulatory Visit (HOSPITAL_COMMUNITY): Payer: Self-pay

## 2023-05-07 ENCOUNTER — Ambulatory Visit: Payer: BC Managed Care – PPO | Attending: Student | Admitting: Nurse Practitioner

## 2023-05-07 ENCOUNTER — Encounter: Payer: Self-pay | Admitting: Nurse Practitioner

## 2023-05-07 VITALS — BP 136/78 | HR 87 | Ht 75.0 in | Wt >= 6400 oz

## 2023-05-07 DIAGNOSIS — I214 Non-ST elevation (NSTEMI) myocardial infarction: Secondary | ICD-10-CM | POA: Diagnosis not present

## 2023-05-07 DIAGNOSIS — I2510 Atherosclerotic heart disease of native coronary artery without angina pectoris: Secondary | ICD-10-CM

## 2023-05-07 DIAGNOSIS — I5032 Chronic diastolic (congestive) heart failure: Secondary | ICD-10-CM | POA: Diagnosis not present

## 2023-05-07 DIAGNOSIS — I480 Paroxysmal atrial fibrillation: Secondary | ICD-10-CM | POA: Diagnosis not present

## 2023-05-07 DIAGNOSIS — R0602 Shortness of breath: Secondary | ICD-10-CM | POA: Diagnosis not present

## 2023-05-07 DIAGNOSIS — I5033 Acute on chronic diastolic (congestive) heart failure: Secondary | ICD-10-CM | POA: Diagnosis not present

## 2023-05-07 DIAGNOSIS — N179 Acute kidney failure, unspecified: Secondary | ICD-10-CM | POA: Diagnosis not present

## 2023-05-07 MED ORDER — METOPROLOL SUCCINATE ER 50 MG PO TB24: 50.0000 mg | ORAL_TABLET | Freq: Every day | ORAL | 11 refills | Status: AC

## 2023-05-07 NOTE — Progress Notes (Signed)
Cardiology Office Note:  .   Date:  05/07/2023  ID:  Mathis Fare, DOB 18-Feb-1981, MRN [id number] PCP: April Manson, NP  [facility] Providers Cardiologist:  Lance Muss, MD    Patient Profile: .      PMH CAD S/p DES to prox LAD in 2020 Hypertension Dyslipidemia PAF Polysubstance abuse Opiate abuse Etoh abuse Hepatic steatosis Morbid obesity Palpitations  He established with cardiology during admission for NSTEMI 10/2018 with several weeks of progressive chest pain.  Troponin was mildly elevated and he was taken for cardiac catheterization on 10/15/2018 which revealed 95% proximal LAD lesion that was successfully stented.  He was referred to lipid clinic for management of hyperlipidemia post PCI.   Presented to ED 06/2022 with palpitations. Found to have A-fib with RVR at 120 bpm.  CHA2DS2-VASc score of 2, but anticoagulation was not started at that time due to history of alcohol detox.  Last cardiology clinic visit was 06/28/2022 with Sharlene Dory, NP.  He had been taking Goody's for headaches on a frequent basis with significant bruises and thrombocytopenia. CBC was obtained and NP discussed management of DAPT as well as consideration of OAC with primary cardiologist. He was advised not to start [facility] but to take aspirin and Plavix.        History of Present Illness: Scott Waller   Dossie Hatcher is a pleasant 42 y.o. male who is here today with his wife and son. His wife reports that since [facility] 04/12/23, patient has had worsening leg edema. He reports that is the only ESI he has had that was painful and caused headache. Leg swelling initially started prior to going to rehab in May 2024. Has occasional slight chest pain but has not taken nitroglycerin.  He reports significant shortness of breath with walking short distance. Has home pulse ox that shows sats in the 80s which makes him very anxious. He is able to lay flat and denies PND. No presyncope, or syncope.  History is somewhat  difficult to follow but it appears admission in [place] in May 2024 while he was in rehab for alcohol abuse.  proBNP was elevated at 837.  Echocardiogram 02/22/23 revealed LVEF 60-65%, mildly dilated RV with mildly reduced RV function, mild LVH, normal wall motion, normal diastolic function.  He was placed on torsemide initially but that was changed to Bumex during a more recent appointment with nephrology? Also has upcoming appointment with pulmonology.  His weight is up almost 100 lb from one year ago. He is in remission from polysubstance abuse.   ROS: See HPI       Studies Reviewed: Scott Waller   EKG Interpretation Date/Time:  Monday May 07 2023 14:58:52 EDT Ventricular Rate:  87 PR Interval:  240 QRS Duration:  80 QT Interval:  346 QTC Calculation: 416 R Axis:   -40  Text Interpretation: Sinus rhythm with 1st degree A-V block Left axis deviation Low voltage QRS No acute ST abnormality Confirmed by Eligha Bridegroom [phone]) on 05/07/2023 3:11:56 PM     Risk Assessment/Calculations:    CHA2DS2-VASc Score = 2   This indicates a 2.2% annual risk of stroke. The patient's score is based upon: CHF History: 0 HTN History: 1 Diabetes History: 0 Stroke History: 0 Vascular Disease History: 1 Age Score: 0 Gender Score: 0            Physical Exam:   VS:  BP 136/78   Pulse 87   Ht 6\' 3"  (1.905 m)   Wt Scott Waller)  436 lb (197.8 kg)   BMI 54.50 kg/m    Wt Readings from Last 3 Encounters:  05/07/23 (!) 436 lb (197.8 kg)  06/28/22 (!) 343 lb (155.6 kg)  06/09/22 (!) 340 lb 3.2 oz (154.3 kg)    GEN: Obese, well developed in no acute distress NECK: No JVD; No carotid bruits CARDIAC: Distant heart sounds  RESPIRATORY:  Clear to auscultation without rales, wheezing or rhonchi  ABDOMEN: Soft, non-tender, non-distended, protuberant EXTREMITIES:  Bilateral LE edema with erythema     ASSESSMENT AND PLAN: .    Acute on chronic HFpEF/DOE/Edema: He reports increased shortness of breath and bilateral LE  edema x  2-3 weeks. Recent admission with echo 02/22/2023 in [place] with normal LVEF, mild LVH, mild RV dysfunction, normal diastolic parameters. Brisk urine output on Bumex.  Body habitus makes it difficult to assess for fluid overload, however legs are significantly swollen. His wife reports his legs and arms are typically skinny. I suspect DOE is multifactorial in the setting of obesity, deconditioning, obesity hypoventilation, and heart failure. We will stop diltiazem and start metoprolol succinate 50 mg daily. Will continue Bumex and get BMP and BNP for further guidance on diuresis.  Advised we will need to see him back in the office soon for medication titration. Encouraged low sodium diet.   CAD: S/p DES to pLAD in 2020. Occasional chest pain and DOE as noted above. No nitroglycerin use.  No wall motion abnormality on echo 01/2023. There is no antiplatelet therapy listed on med list.  We did not discuss this specifically. Plan to address at next office visit.   Hyperlipidemia LDL goal < 70: LDL 177 on 10/10/1476. Not specifically addressed at this office visit. Plan to discuss at next office visit.   Morbid obesity: Weight is a significant factor for his current DOE. I emphasized the importance of weight loss. We discussed specific ways for him to work on improving his diet and how to gradually increase physical activity. He was started on semaglutide last week.  Polysubstance Abuse: Currently in remission per his report.         Dispo: 2-3 weeks with me  Signed, Eligha Bridegroom, NP-C

## 2023-05-07 NOTE — Patient Instructions (Signed)
Medication Instructions:   DISCONTINUE Cardizem  START Toprol one tablet by mouth  ( 50 mg) daily.   *If you need a refill on your cardiac medications before your next appointment, please call your pharmacy*   Lab Work:  TODAY!!!! PRO [email or web address]  If you have labs (blood work) drawn today and your tests are completely normal, you will receive your results only by: MyChart Message (if you have MyChart) OR A paper copy in the mail If you have any lab test that is abnormal or we need to change your treatment, we will call you to review the results.   Testing/Procedures:  None    Follow-Up: At [facility], you and your health needs are our priority.  As part of our continuing mission to provide you with exceptional heart care, we have created designated Provider Care Teams.  These Care Teams include your primary Cardiologist (physician) and Advanced Practice Providers (APPs -  Physician Assistants and Nurse Practitioners) who all work together to provide you with the care you need, when you need it.  We recommend signing up for the patient portal called "MyChart".  Sign up information is provided on this After Visit Summary.  MyChart is used to connect with patients for Virtual Visits (Telemedicine).  Patients are able to view lab/test results, encounter notes, upcoming appointments, etc.  Non-urgent messages can be sent to your provider as well.   To learn more about what you can do with MyChart, go to [email or web address].    Your next appointment:   2 week(s)  Provider:   Eligha Bridegroom, NP

## 2023-05-08 ENCOUNTER — Ambulatory Visit: Payer: Self-pay | Admitting: Student

## 2023-05-09 DIAGNOSIS — J9611 Chronic respiratory failure with hypoxia: Secondary | ICD-10-CM | POA: Diagnosis not present

## 2023-05-09 DIAGNOSIS — R911 Solitary pulmonary nodule: Secondary | ICD-10-CM | POA: Diagnosis not present

## 2023-05-09 DIAGNOSIS — G4710 Hypersomnia, unspecified: Secondary | ICD-10-CM | POA: Diagnosis not present

## 2023-05-09 DIAGNOSIS — Z6841 Body Mass Index (BMI) 40.0 and over, adult: Secondary | ICD-10-CM | POA: Diagnosis not present

## 2023-05-09 DIAGNOSIS — J9612 Chronic respiratory failure with hypercapnia: Secondary | ICD-10-CM | POA: Diagnosis not present

## 2023-05-24 DIAGNOSIS — M7918 Myalgia, other site: Secondary | ICD-10-CM | POA: Diagnosis not present

## 2023-05-27 NOTE — Progress Notes (Unsigned)
Cardiology Office Note:  .   Date:  05/29/2023  ID:  Scott Waller, DOB 22-Oct-1980, MRN [id number] PCP: April Manson, NP  [facility] Providers Cardiologist:  Jodelle Red, MD    Patient Profile: .      PMH CAD S/p DES to prox LAD in 2020 Hypertension Dyslipidemia PAF Polysubstance abuse Opiate abuse Etoh abuse Hepatic steatosis Morbid obesity Palpitations  He established with cardiology during admission for NSTEMI 10/2018 with several weeks of progressive chest pain.  Troponin was mildly elevated and he was taken for cardiac catheterization on 10/15/2018 which revealed 95% proximal LAD lesion that was successfully stented.  He was referred to lipid clinic for management of hyperlipidemia post PCI.   Presented to ED 06/2022 with palpitations. Found to have A-fib with RVR at 120 bpm.  CHA2DS2-VASc score of 2, but anticoagulation was not started at that time due to history of alcohol detox.  Seen 06/28/2022 by Scott Dory, NP.  He had been taking Goody's for headaches on a frequent basis with significant bruises and thrombocytopenia. CBC was obtained and NP discussed management of DAPT as well as consideration of OAC with primary cardiologist. He was advised not to start [facility] but to take aspirin and Plavix.   Seen by me on 05/07/23, accompanied by his wife and son. His wife reports that since [facility] 04/12/23, patient has had worsening leg edema. He reports that is the only ESI he has had that was painful and caused headache. Leg swelling initially started prior to going to rehab in May 2024. Has occasional slight chest pain but has not taken nitroglycerin.  He reports significant shortness of breath with walking short distance. Has home pulse ox that shows sats in the 80s which makes him very anxious. He is able to lay flat and denies PND. No presyncope, or syncope.  History is somewhat difficult to follow but it appears admission in [place] in May 2024 while he was in rehab for  alcohol abuse.  proBNP was elevated at 837.  Echocardiogram 02/22/23 revealed LVEF 60-65%, mildly dilated RV with mildly reduced RV function, mild LVH, normal wall motion, normal diastolic function.  He was placed on torsemide initially but that was changed to Bumex during a more recent appointment with nephrology? Also has upcoming appointment with pulmonology.  His weight is up almost 100 lb from one year ago. He is in remission from polysubstance abuse. Diltiazem was discontinued and he was started on Toprol XL. BNP was 76 and kidney function was stable. Bumex was continued at 1 mg twice daily. He was strongly encouraged to work on weight loss.         History of Present Illness: Marland Kitchen   Scott Waller is a pleasant 42 y.o. male who is here today for follow-up and reports he is feeling better. He has less swelling in his arms and legs, thinks it was related to receiving epidural steroid injection. States his back and knee pain has returned. He denies chest pain, palpitations, orthopnea, PND, edema, presyncope, syncope.  Has lost 24 lbs according to our scale. He is unsure of home weight, but notes improvement in the way he feels and less edema.  Admits he eats a poor diet.  We discussed ways to improve his diet and increase physical activity. Was told to consider getting disability by ortho due to history of psoriatic arthritis. 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   ROS:  See HPI       Studies Reviewed: .         Risk Assessment/Calculations:    CHA2DS2-VASc Score = 2   This indicates a 2.2% annual risk of stroke. The patient's score is based upon: CHF History: 0 HTN History: 1 Diabetes History: 0 Stroke History: 0 Vascular Disease History: 1 Age Score: 0 Gender Score: 0            Physical Exam:   VS:  BP 110/62   Pulse 94   Ht 6\' 3"  (1.905 m)   Wt (!) 412 lb (186.9 kg)   SpO2 95%   BMI 51.50 kg/m    Wt Readings from Last 3  Encounters:  05/29/23 (!) 412 lb (186.9 kg)  05/07/23 (!) 436 lb (197.8 kg)  06/28/22 (!) 343 lb (155.6 kg)    GEN: Obese, well developed in no acute distress NECK: No JVD; No carotid bruits CARDIAC: Distant heart sounds  RESPIRATORY:  Clear to auscultation without rales, wheezing or rhonchi  ABDOMEN: Soft, non-tender, non-distended, protuberant EXTREMITIES:  No edema, no deformity     ASSESSMENT AND PLAN: .    Chronic HFpEF: He presented with bilateral LE edema and increased shortness of breath on 05/07/23. Echo completed 02/22/2023 during hospitalization in [place] with normal LVEF, mild LVH, mild RV dysfunction, normal diastolic parameters. Today, he reports weight loss and decrease in edema since last office visit. Body habitus makes it difficult to assess for fluid overload, however LE edema has improved. DOE likely multifactorial in the setting of obesity, deconditioning, obesity hypoventilation, and heart failure. We stopped diltiazem and started metoprolol succinate 50 mg daily. He is tolerating this change without any significant side effects. Encouraged low sodium diet and continuing efforts for weight loss and increased physical activity.    CAD without angina: S/p DES to pLAD in 2020. Occasional chest pain and DOE as noted above. No nitroglycerin use.  No wall motion abnormality on echo 01/2023. He denies chest pain, dyspnea, or other symptoms concerning for angina.  No indication for further ischemic evaluation at this time. Discussion about resuming asa. His wife encouraged him to start low dose asa after reading my last note but it is causing him to have bruising and mild bleeding. Advised he may d/c. Not on statin. We will recheck lipid panel in 1 week when he returns from out of town trip.  Advised I will likely recommend he start statin therapy.   Hyperlipidemia LDL goal < 70: LDL 177 on 10/07/1094. We will recheck in one week when he can return in fasting state.  As noted above, we  will recommend statin therapy if LDL remains > 70.   Morbid obesity: Weight is a significant factor. He is working on weight loss with Wegovy, by our scale has lost 24  lbs. Admits to poor diet but is trying to be more active. Is walking around the football field while his son practices. We discussed specific dietary changes that may be beneficial.  Encouraged continued weight loss efforts.   PAF: He is asymptomatic. HR is well controlled. CHA2DS2-VASc score of 2 but not previously started on OAC due to thrombocytopenia. No evidence of a fib to his awareness. Continue to monitor.   Hypertension: BP is well controlled. We will recheck renal function and electrolytes at upcoming lab appointment.   Polysubstance Abuse: Currently in remission per his report.         Dispo: 6 months with me  Signed, Marcelino Duster  Anabella Capshaw, NP-C

## 2023-05-29 ENCOUNTER — Ambulatory Visit: Payer: BC Managed Care – PPO | Admitting: Nurse Practitioner

## 2023-05-29 ENCOUNTER — Encounter: Payer: Self-pay | Admitting: Nurse Practitioner

## 2023-05-29 VITALS — BP 110/62 | HR 94 | Ht 75.0 in | Wt >= 6400 oz

## 2023-05-29 DIAGNOSIS — F1910 Other psychoactive substance abuse, uncomplicated: Secondary | ICD-10-CM

## 2023-05-29 DIAGNOSIS — I480 Paroxysmal atrial fibrillation: Secondary | ICD-10-CM | POA: Diagnosis not present

## 2023-05-29 DIAGNOSIS — I5032 Chronic diastolic (congestive) heart failure: Secondary | ICD-10-CM | POA: Diagnosis not present

## 2023-05-29 DIAGNOSIS — I10 Essential (primary) hypertension: Secondary | ICD-10-CM

## 2023-05-29 DIAGNOSIS — I2510 Atherosclerotic heart disease of native coronary artery without angina pectoris: Secondary | ICD-10-CM | POA: Diagnosis not present

## 2023-05-29 DIAGNOSIS — Z862 Personal history of diseases of the blood and blood-forming organs and certain disorders involving the immune mechanism: Secondary | ICD-10-CM

## 2023-05-29 DIAGNOSIS — E785 Hyperlipidemia, unspecified: Secondary | ICD-10-CM

## 2023-05-29 NOTE — Patient Instructions (Signed)
Medication Instructions:   DISCONTINUE Asprin  Your physician recommends that you continue on your current medications as directed. Please refer to the Current Medication list given to you today.   *If you need a refill on your cardiac medications before your next appointment, please call your pharmacy*   Lab Work:  Your physician recommends that you return for a FASTING lipid profile/cmet on Thursday, September 5.    If you have labs (blood work) drawn today and your tests are completely normal, you will receive your results only by: MyChart Message (if you have MyChart) OR A paper copy in the mail If you have any lab test that is abnormal or we need to change your treatment, we will call you to review the results.   Testing/Procedures:  None ordered.   Follow-Up: At [facility], you and your health needs are our priority.  As part of our continuing mission to provide you with exceptional heart care, we have created designated Provider Care Teams.  These Care Teams include your primary Cardiologist (physician) and Advanced Practice Providers (APPs -  Physician Assistants and Nurse Practitioners) who all work together to provide you with the care you need, when you need it.  We recommend signing up for the patient portal called "MyChart".  Sign up information is provided on this After Visit Summary.  MyChart is used to connect with patients for Virtual Visits (Telemedicine).  Patients are able to view lab/test results, encounter notes, upcoming appointments, etc.  Non-urgent messages can be sent to your provider as well.   To learn more about what you can do with MyChart, go to [email or web address].    Your next appointment:   6 month(s)  Provider:   Eligha Bridegroom, NP    Other Instructions  Your physician wants you to follow-up in: 6 months with Lebron Conners.  You will receive a reminder letter in the mail two months in advance. If you don't receive a  letter, please call our office to schedule the follow-up appointment.

## 2023-06-25 DIAGNOSIS — E662 Morbid (severe) obesity with alveolar hypoventilation: Secondary | ICD-10-CM | POA: Diagnosis not present

## 2023-06-25 DIAGNOSIS — K219 Gastro-esophageal reflux disease without esophagitis: Secondary | ICD-10-CM | POA: Diagnosis not present

## 2023-06-25 DIAGNOSIS — J440 Chronic obstructive pulmonary disease with acute lower respiratory infection: Secondary | ICD-10-CM | POA: Diagnosis not present

## 2023-06-25 DIAGNOSIS — R600 Localized edema: Secondary | ICD-10-CM | POA: Diagnosis not present

## 2023-06-25 DIAGNOSIS — F17210 Nicotine dependence, cigarettes, uncomplicated: Secondary | ICD-10-CM | POA: Diagnosis not present

## 2023-06-25 DIAGNOSIS — I110 Hypertensive heart disease with heart failure: Secondary | ICD-10-CM | POA: Diagnosis not present

## 2023-06-25 DIAGNOSIS — R0902 Hypoxemia: Secondary | ICD-10-CM | POA: Diagnosis not present

## 2023-06-25 DIAGNOSIS — R0989 Other specified symptoms and signs involving the circulatory and respiratory systems: Secondary | ICD-10-CM | POA: Diagnosis not present

## 2023-06-25 DIAGNOSIS — I480 Paroxysmal atrial fibrillation: Secondary | ICD-10-CM | POA: Diagnosis not present

## 2023-06-25 DIAGNOSIS — R1031 Right lower quadrant pain: Secondary | ICD-10-CM | POA: Diagnosis not present

## 2023-06-25 DIAGNOSIS — L03115 Cellulitis of right lower limb: Secondary | ICD-10-CM | POA: Diagnosis not present

## 2023-06-25 DIAGNOSIS — J189 Pneumonia, unspecified organism: Secondary | ICD-10-CM | POA: Diagnosis not present

## 2023-06-25 DIAGNOSIS — F32A Depression, unspecified: Secondary | ICD-10-CM | POA: Diagnosis not present

## 2023-06-25 DIAGNOSIS — E876 Hypokalemia: Secondary | ICD-10-CM | POA: Diagnosis not present

## 2023-06-25 DIAGNOSIS — J9601 Acute respiratory failure with hypoxia: Secondary | ICD-10-CM | POA: Diagnosis not present

## 2023-06-25 DIAGNOSIS — J9621 Acute and chronic respiratory failure with hypoxia: Secondary | ICD-10-CM | POA: Diagnosis not present

## 2023-06-25 DIAGNOSIS — I5032 Chronic diastolic (congestive) heart failure: Secondary | ICD-10-CM | POA: Diagnosis not present

## 2023-06-25 DIAGNOSIS — Z6841 Body Mass Index (BMI) 40.0 and over, adult: Secondary | ICD-10-CM | POA: Diagnosis not present

## 2023-06-25 DIAGNOSIS — R918 Other nonspecific abnormal finding of lung field: Secondary | ICD-10-CM | POA: Diagnosis not present

## 2023-06-25 DIAGNOSIS — I2510 Atherosclerotic heart disease of native coronary artery without angina pectoris: Secondary | ICD-10-CM | POA: Diagnosis not present

## 2023-06-25 DIAGNOSIS — E782 Mixed hyperlipidemia: Secondary | ICD-10-CM | POA: Diagnosis not present

## 2023-06-25 DIAGNOSIS — F419 Anxiety disorder, unspecified: Secondary | ICD-10-CM | POA: Diagnosis not present

## 2023-06-25 DIAGNOSIS — R4182 Altered mental status, unspecified: Secondary | ICD-10-CM | POA: Diagnosis not present

## 2023-06-25 DIAGNOSIS — J9622 Acute and chronic respiratory failure with hypercapnia: Secondary | ICD-10-CM | POA: Diagnosis not present

## 2023-06-25 DIAGNOSIS — R0600 Dyspnea, unspecified: Secondary | ICD-10-CM | POA: Diagnosis not present

## 2023-06-25 DIAGNOSIS — R0602 Shortness of breath: Secondary | ICD-10-CM | POA: Diagnosis not present

## 2023-06-25 DIAGNOSIS — J9602 Acute respiratory failure with hypercapnia: Secondary | ICD-10-CM | POA: Diagnosis not present

## 2023-06-25 DIAGNOSIS — I517 Cardiomegaly: Secondary | ICD-10-CM | POA: Diagnosis not present

## 2023-06-25 DIAGNOSIS — M25551 Pain in right hip: Secondary | ICD-10-CM | POA: Diagnosis not present

## 2023-07-09 DIAGNOSIS — I480 Paroxysmal atrial fibrillation: Secondary | ICD-10-CM | POA: Diagnosis not present

## 2023-07-09 DIAGNOSIS — F1121 Opioid dependence, in remission: Secondary | ICD-10-CM | POA: Diagnosis not present

## 2023-07-09 DIAGNOSIS — D84821 Immunodeficiency due to drugs: Secondary | ICD-10-CM | POA: Diagnosis not present

## 2023-07-09 DIAGNOSIS — J9601 Acute respiratory failure with hypoxia: Secondary | ICD-10-CM | POA: Diagnosis not present

## 2023-07-12 ENCOUNTER — Telehealth (HOSPITAL_COMMUNITY): Payer: Self-pay

## 2023-07-12 DIAGNOSIS — G5603 Carpal tunnel syndrome, bilateral upper limbs: Secondary | ICD-10-CM | POA: Diagnosis not present

## 2023-07-12 DIAGNOSIS — M7918 Myalgia, other site: Secondary | ICD-10-CM | POA: Diagnosis not present

## 2023-07-12 NOTE — Telephone Encounter (Signed)
Called and spoke with pt wife Bonita Quin who stated pt is not interested at this time.

## 2023-07-12 NOTE — Telephone Encounter (Signed)
Outside/paper referral received by Dr. Mora Bellman from [place]. Will fax over Physician order and request further documents. Insurance benefits and eligibility to be determined.

## 2023-07-23 DIAGNOSIS — M25551 Pain in right hip: Secondary | ICD-10-CM | POA: Diagnosis not present

## 2023-07-28 DIAGNOSIS — J9602 Acute respiratory failure with hypercapnia: Secondary | ICD-10-CM | POA: Diagnosis not present

## 2023-07-31 DIAGNOSIS — L4059 Other psoriatic arthropathy: Secondary | ICD-10-CM | POA: Diagnosis not present

## 2023-07-31 DIAGNOSIS — K760 Fatty (change of) liver, not elsewhere classified: Secondary | ICD-10-CM | POA: Diagnosis not present

## 2023-07-31 DIAGNOSIS — L409 Psoriasis, unspecified: Secondary | ICD-10-CM | POA: Diagnosis not present

## 2023-07-31 DIAGNOSIS — R5382 Chronic fatigue, unspecified: Secondary | ICD-10-CM | POA: Diagnosis not present

## 2023-08-09 DIAGNOSIS — Z79899 Other long term (current) drug therapy: Secondary | ICD-10-CM | POA: Diagnosis not present

## 2023-08-28 DIAGNOSIS — J9602 Acute respiratory failure with hypercapnia: Secondary | ICD-10-CM | POA: Diagnosis not present

## 2023-09-07 DIAGNOSIS — F1120 Opioid dependence, uncomplicated: Secondary | ICD-10-CM | POA: Diagnosis not present

## 2023-09-07 DIAGNOSIS — J9602 Acute respiratory failure with hypercapnia: Secondary | ICD-10-CM | POA: Diagnosis not present

## 2023-09-07 DIAGNOSIS — F1020 Alcohol dependence, uncomplicated: Secondary | ICD-10-CM | POA: Diagnosis not present

## 2023-09-13 ENCOUNTER — Encounter (HOSPITAL_BASED_OUTPATIENT_CLINIC_OR_DEPARTMENT_OTHER): Payer: Self-pay | Admitting: Pulmonary Disease

## 2023-09-17 ENCOUNTER — Institutional Professional Consult (permissible substitution) (HOSPITAL_BASED_OUTPATIENT_CLINIC_OR_DEPARTMENT_OTHER): Payer: BC Managed Care – PPO | Admitting: Pulmonary Disease

## 2023-10-08 DIAGNOSIS — F1120 Opioid dependence, uncomplicated: Secondary | ICD-10-CM | POA: Diagnosis not present

## 2023-10-18 DIAGNOSIS — G5603 Carpal tunnel syndrome, bilateral upper limbs: Secondary | ICD-10-CM | POA: Diagnosis not present

## 2023-10-18 DIAGNOSIS — M7918 Myalgia, other site: Secondary | ICD-10-CM | POA: Diagnosis not present

## 2023-10-28 DIAGNOSIS — J9602 Acute respiratory failure with hypercapnia: Secondary | ICD-10-CM | POA: Diagnosis not present

## 2023-10-30 DIAGNOSIS — R5382 Chronic fatigue, unspecified: Secondary | ICD-10-CM | POA: Diagnosis not present

## 2023-10-30 DIAGNOSIS — L4059 Other psoriatic arthropathy: Secondary | ICD-10-CM | POA: Diagnosis not present

## 2023-10-30 DIAGNOSIS — L409 Psoriasis, unspecified: Secondary | ICD-10-CM | POA: Diagnosis not present

## 2023-10-30 DIAGNOSIS — K760 Fatty (change of) liver, not elsewhere classified: Secondary | ICD-10-CM | POA: Diagnosis not present

## 2023-11-13 DIAGNOSIS — G894 Chronic pain syndrome: Secondary | ICD-10-CM | POA: Diagnosis not present

## 2023-11-26 DIAGNOSIS — R7989 Other specified abnormal findings of blood chemistry: Secondary | ICD-10-CM | POA: Diagnosis not present

## 2023-11-26 DIAGNOSIS — I10 Essential (primary) hypertension: Secondary | ICD-10-CM | POA: Diagnosis not present

## 2023-11-26 DIAGNOSIS — F1090 Alcohol use, unspecified, uncomplicated: Secondary | ICD-10-CM | POA: Diagnosis not present

## 2023-11-26 DIAGNOSIS — Z6841 Body Mass Index (BMI) 40.0 and over, adult: Secondary | ICD-10-CM | POA: Diagnosis not present

## 2023-11-26 DIAGNOSIS — N529 Male erectile dysfunction, unspecified: Secondary | ICD-10-CM | POA: Diagnosis not present

## 2024-04-16 ENCOUNTER — Encounter (HOSPITAL_BASED_OUTPATIENT_CLINIC_OR_DEPARTMENT_OTHER): Payer: Self-pay

## 2024-04-16 NOTE — Progress Notes (Unsigned)
 Cardiology Office Note:  .   Date:  04/16/2024  ID:  Scott Waller, DOB 12/10/1980, MRN [id number] PCP: Scott Aldona CROME, NP  [facility] Providers Cardiologist:  Scott Bruckner, MD    Patient Profile: .      PMH CAD S/p DES to prox LAD in 2020 Hypertension Dyslipidemia PAF on chronic anticoagulation Polysubstance abuse Opiate abuse Etoh abuse Hepatic steatosis Morbid obesity Palpitations Chronic HFpEF  He established with cardiology during admission for NSTEMI 10/2018 with several weeks of progressive chest pain.  Troponin was mildly elevated and he was taken for cardiac catheterization on 10/15/2018 which revealed 95% proximal LAD lesion that was successfully stented.  He was referred to lipid clinic for management of hyperlipidemia post PCI.   Presented to ED 06/2022 with palpitations. Found to have A-fib with RVR at 120 bpm.  CHA2DS2-VASc score of 2, but anticoagulation was not started at that time due to history of alcohol detox.  Seen 06/28/2022 by Scott Crate, NP.  He had been taking Goody's for headaches on a frequent basis with significant bruises and thrombocytopenia. CBC was obtained and NP discussed management of DAPT as well as consideration of OAC with primary cardiologist. He was advised not to start OAC but to take aspirin  and Plavix .   Seen by me on 05/07/23, accompanied by his wife and son. His wife reports that since [facility] 04/12/23, he has had worsening leg edema. This is only ESI he has had that was painful and caused headache. Leg swelling initially started prior to going to rehab in May 2024. Occasional slight chest pain but has not taken nitroglycerin . Significant shortness of breath with walking short distance. Has home pulse ox that shows sats in the 80s which makes him very anxious. He is able to lay flat and denies PND. No presyncope, or syncope.  History is somewhat difficult to follow but it appears admission in [place]  in May 2024 while he was in  rehab for alcohol abuse.  proBNP was elevated at 837.  Echocardiogram 02/22/23 revealed LVEF 60-65%, mildly dilated RV with mildly reduced RV function, mild LVH, normal wall motion, normal diastolic function.  He was placed on torsemide  initially but that was changed to Bumex during a more recent appointment with nephrology? Also has upcoming appointment with pulmonology.  His weight is up almost 100 lb from one year ago. He is in remission from polysubstance abuse. Diltiazem  was discontinued and he was started on Toprol  XL. BNP was 76 and kidney function was stable. Bumex was continued at 1 mg twice daily. He was strongly encouraged to work on weight loss.   Seen in follow-up 05/29/23 by me and reported improvement in swelling which he attributed the increase to [facility]. Back and knee pain has returned. No chest pain, palpitations, orthopnea, PND, edema, presyncope, syncope.  Has lost 24 lbs according to our scale. Is unsure of home weight, but notes improvement in the way he feels and less edema.  Admits he eats a poor diet.  We discussed ways to improve his diet and increase physical activity. Was told to consider getting disability by ortho due to history of psoriatic arthritis. 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         History of Present Illness: Scott Waller   Scott Waller is a pleasant 43 y.o. male     ROS: See HPI       Studies Reviewed: .  Risk Assessment/Calculations:    CHA2DS2-VASc Score = 2   This indicates a 2.2% annual risk of stroke. The patient's score is based upon: CHF History: 0 HTN History: 1 Diabetes History: 0 Stroke History: 0 Vascular Disease History: 1 Age Score: 0 Gender Score: 0    {This patient has a significant risk of stroke if diagnosed with atrial fibrillation.  Please consider VKA or DOAC agent for anticoagulation if the bleeding risk is acceptable.   You can also use the SmartPhrase .[email or web address] for  documentation.   :[id number]} No BP recorded.  {Refresh Note OR Click here to enter BP  :1}***       Physical Exam:   VS:  There were no vitals taken for this visit.   Wt Readings from Last 3 Encounters:  05/29/23 (!) 412 lb (186.9 kg)  05/07/23 (!) 436 lb (197.8 kg)  06/28/22 (!) 343 lb (155.6 kg)    GEN: Obese, well developed in no acute distress NECK: No JVD; No carotid bruits CARDIAC: Distant heart sounds  RESPIRATORY:  Clear to auscultation without rales, wheezing or rhonchi  ABDOMEN: Soft, non-tender, non-distended, protuberant EXTREMITIES:  No edema, no deformity     ASSESSMENT AND PLAN: .    Chronic HFpEF: He presented with bilateral LE edema and increased shortness of breath on 05/07/23. Echo completed 02/22/2023 during hospitalization in [place]  with normal LVEF, mild LVH, mild RV dysfunction, normal diastolic parameters. Today, he reports weight loss and decrease in edema since last office visit. Body habitus makes it difficult to assess for fluid overload, however LE edema has improved. DOE likely multifactorial in the setting of obesity, deconditioning, obesity hypoventilation, and heart failure. We stopped diltiazem  and started metoprolol  succinate 50 mg daily. He is tolerating this change without any significant side effects. Encouraged low sodium diet and continuing efforts for weight loss and increased physical activity.    CAD without angina: S/p DES to pLAD in 2020. Occasional chest pain and DOE as noted above. No nitroglycerin  use.  No wall motion abnormality on echo 01/2023. He denies chest pain, dyspnea, or other symptoms concerning for angina.  No indication for further ischemic evaluation at this time. Discussion about resuming asa. His wife encouraged him to start low dose asa after reading my last note but it is causing him to have bruising and mild bleeding. Advised he may d/c. Not on statin. We will recheck lipid panel in 1 week when he returns from out of town trip.   Advised I will likely recommend he start statin therapy.   Hyperlipidemia LDL goal < 70: LDL 177 on 0/01/7975. We will recheck in one week when he can return in fasting state.  As noted above, we will recommend statin therapy if LDL remains > 70.   Morbid obesity: Weight is a significant factor. He is working on weight loss with Wegovy, by our scale has lost 24  lbs. Admits to poor diet but is trying to be more active. Is walking around the football field while his son practices. We discussed specific dietary changes that may be beneficial.  Encouraged continued weight loss efforts.   PAF: He is asymptomatic. HR is well controlled. CHA2DS2-VASc score of 2 but not previously started on OAC due to thrombocytopenia. No evidence of a fib to his awareness. Continue to monitor.   Hypertension: BP is well controlled. We will recheck renal function and electrolytes at upcoming lab appointment.   Polysubstance Abuse: Currently in remission per his report.  Dispo: ***  Signed, Rosaline Bane, NP-C

## 2024-04-17 ENCOUNTER — Encounter (HOSPITAL_BASED_OUTPATIENT_CLINIC_OR_DEPARTMENT_OTHER): Admitting: Nurse Practitioner

## 2024-04-17 ENCOUNTER — Encounter (HOSPITAL_BASED_OUTPATIENT_CLINIC_OR_DEPARTMENT_OTHER): Payer: Self-pay

## 2024-09-26 ENCOUNTER — Encounter (HOSPITAL_BASED_OUTPATIENT_CLINIC_OR_DEPARTMENT_OTHER): Payer: Self-pay

## 2024-09-28 NOTE — Progress Notes (Deleted)
 " Cardiology Office Note:  .   Date:  09/28/2024  ID:  Scott Waller, DOB 09-Jul-1981, MRN [id number] PCP: Teresa Aldona CROME, NP  [facility] Providers Cardiologist:  Shelda Bruckner, MD    Patient Profile: .      PMH CAD S/p DES to prox LAD in 2020 Hypertension Dyslipidemia PAF Polysubstance abuse Opiate abuse Etoh abuse Hepatic steatosis Morbid obesity Palpitations  He established with cardiology during admission for NSTEMI 10/2018 with several weeks of progressive chest pain.  Troponin was mildly elevated and he was taken for cardiac catheterization on 10/15/2018 which revealed 95% proximal LAD lesion that was successfully stented.  He was referred to lipid clinic for management of hyperlipidemia post PCI.   Presented to ED 06/2022 with palpitations. Found to have A-fib with RVR at 120 bpm.  CHA2DS2-VASc score of 2, but anticoagulation was not started at that time due to history of alcohol detox.  Seen 06/28/2022 by Almarie Crate, NP.  He had been taking Goody's for headaches on a frequent basis with significant bruises and thrombocytopenia. CBC was obtained and NP discussed management of DAPT as well as consideration of OAC with primary cardiologist. He was advised not to start OAC but to take aspirin  and Plavix .   Seen by me on 05/07/23, accompanied by his wife and son. His wife reports that since [facility] 04/12/23, patient has had worsening leg edema. He reports that is the only ESI he has had that was painful and caused headache. Leg swelling initially started prior to going to rehab in May 2024. Has occasional slight chest pain but has not taken nitroglycerin . Significant shortness of breath with walking short distance. Has home pulse ox that shows sats in the 80s which makes him very anxious. He is able to lay flat and denies PND. No presyncope, or syncope.  History is somewhat difficult to follow but it appears admission in [place]  in May 2024 while he was in rehab for alcohol  abuse.  proBNP was elevated at 837.  Echocardiogram 02/22/23 revealed LVEF 60-65%, mildly dilated RV with mildly reduced RV function, mild LVH, normal wall motion, normal diastolic function. Started torsemide  initially but that was changed to Bumex during a more recent appointment with nephrology? Also has upcoming appointment with pulmonology.  His weight is up almost 100 lb from one year ago. Is in remission from polysubstance abuse. Diltiazem  was discontinued and he was started on Toprol  XL. BNP was 76 and kidney function was stable. Bumex was continued at 1 mg twice daily. He was strongly encouraged to work on weight loss.   Seen in clinic by me 05/29/2023 for follow-up of leg edema. Less swelling in his arms and legs, thinks it was related to receiving epidural steroid injection. States his back and knee pain has returned. No chest pain, palpitations, orthopnea, PND, edema, presyncope, syncope.  Has lost 24 lbs according to our scale. He is unsure of home weight, but notes improvement in the way he feels and less edema.  Admits he eats a poor diet.  We discussed ways to improve his diet and increase physical activity. Was told to consider getting disability by ortho due to history of psoriatic arthritis. 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  He was encouraged to have follow-up lipid testing due to LDL above goal.  Admission 07/2024 for acute respiratory failure requiring intubation and mechanical ventilation.  He had A-fib which was managed with amiodarone  and he was started on Eliquis 5 mg twice daily.        History of Present Illness: SABRA   Scott Waller is a pleasant 43 y.o. male who is here today for   ROS: See HPI       Studies Reviewed: .         Risk Assessment/Calculations:    CHA2DS2-VASc Score = 2   This indicates a 2.2% annual risk of stroke. The patient's score is based upon: CHF History: 0 HTN History: 1 Diabetes History:  0 Stroke History: 0 Vascular Disease History: 1 Age Score: 0 Gender Score: 0   {This patient has a significant risk of stroke if diagnosed with atrial fibrillation.  Please consider VKA or DOAC agent for anticoagulation if the bleeding risk is acceptable.   You can also use the SmartPhrase .[email or web address] for documentation.   :[id number]} No BP recorded.  {Refresh Note OR Click here to enter BP  :1}***       Physical Exam:   VS:  There were no vitals taken for this visit.   Wt Readings from Last 3 Encounters:  05/29/23 (!) 412 lb (186.9 kg)  05/07/23 (!) 436 lb (197.8 kg)  06/28/22 (!) 343 lb (155.6 kg)    GEN: Obese, well developed in no acute distress NECK: No JVD; No carotid bruits CARDIAC: Distant heart sounds  RESPIRATORY:  Clear to auscultation without rales, wheezing or rhonchi  ABDOMEN: Soft, non-tender, non-distended, protuberant EXTREMITIES:  No edema, no deformity     ASSESSMENT AND PLAN: .    Chronic HFpEF: He presented with bilateral LE edema and increased shortness of breath on 05/07/23. Echo completed 02/22/2023 during hospitalization in [place]  with normal LVEF, mild LVH, mild RV dysfunction, normal diastolic parameters. Today, he reports weight loss and decrease in edema since last office visit. Body habitus makes it difficult to assess for fluid overload, however LE edema has improved. DOE likely multifactorial in the setting of obesity, deconditioning, obesity hypoventilation, and heart failure. We stopped diltiazem  and started metoprolol  succinate 50 mg daily. He is tolerating this change without any significant side effects. Encouraged low sodium diet and continuing efforts for weight loss and increased physical activity.    CAD without angina: S/p DES to pLAD in 2020. Occasional chest pain and DOE as noted above. No nitroglycerin  use.  No wall motion abnormality on echo 01/2023. He denies chest pain, dyspnea, or other symptoms concerning for angina.  No indication for  further ischemic evaluation at this time. Discussion about resuming asa. His wife encouraged him to start low dose asa after reading my last note but it is causing him to have bruising and mild bleeding. Advised he may d/c. Not on statin. We will recheck lipid panel in 1 week when he returns from out of town trip.  Advised I will likely recommend he start statin therapy.   Hyperlipidemia LDL goal < 70: LDL 177 on 0/01/7975. We will recheck in one week when he can return in fasting state.  As noted above, we will recommend statin therapy if LDL remains > 70.   Morbid obesity: Weight is a significant factor. He is working on weight loss with Wegovy, by our scale has lost 24  lbs. Admits to poor diet but is trying to be more active. Is walking around the football field while his son practices. We discussed specific dietary changes that may be beneficial.  Encouraged continued weight loss efforts.   PAF: He is asymptomatic. HR is  well controlled. CHA2DS2-VASc score of 2 but not previously started on OAC due to thrombocytopenia. No evidence of a fib to his awareness. Continue to monitor.   Hypertension: BP is well controlled. We will recheck renal function and electrolytes at upcoming lab appointment.   Polysubstance Abuse: Currently in remission per his report.         Dispo: ***  Signed, Rosaline Bane, NP-C "

## 2024-09-29 ENCOUNTER — Ambulatory Visit (HOSPITAL_BASED_OUTPATIENT_CLINIC_OR_DEPARTMENT_OTHER): Admitting: Nurse Practitioner
